# Patient Record
Sex: Male | Born: 1950 | Race: Black or African American | Hispanic: No | Smoking: Current every day smoker
Health system: Southern US, Community
[De-identification: ages and names within clinical notes are randomized; demographics above are authoritative.]

## PROBLEM LIST (undated history)

## (undated) DIAGNOSIS — F10231 Alcohol dependence with withdrawal delirium: Secondary | ICD-10-CM

## (undated) DIAGNOSIS — R4182 Altered mental status, unspecified: Secondary | ICD-10-CM

## (undated) DIAGNOSIS — K047 Periapical abscess without sinus: Secondary | ICD-10-CM

## (undated) DIAGNOSIS — M25462 Effusion, left knee: Secondary | ICD-10-CM

---

## 2006-10-22 ENCOUNTER — Emergency Department (HOSPITAL_COMMUNITY): Admission: EM | Admit: 2006-10-22 | Discharge: 2006-10-22 | Payer: Self-pay | Admitting: Emergency Medicine

## 2013-05-20 ENCOUNTER — Encounter (HOSPITAL_COMMUNITY): Payer: Self-pay | Admitting: Emergency Medicine

## 2013-05-20 ENCOUNTER — Emergency Department (HOSPITAL_COMMUNITY)
Admission: EM | Admit: 2013-05-20 | Discharge: 2013-05-20 | Disposition: A | Discharge status: Nursing Home (Includes ICF) | Payer: Medicaid Other | Source: Home / Self Care

## 2013-05-20 ENCOUNTER — Emergency Department (HOSPITAL_COMMUNITY): Payer: Medicaid Other

## 2013-05-20 ENCOUNTER — Emergency Department (HOSPITAL_COMMUNITY)
Admission: EM | Admit: 2013-05-20 | Discharge: 2013-05-20 | Disposition: A | Discharge status: Home / Self Care | Payer: Medicaid Other | Attending: Emergency Medicine | Admitting: Emergency Medicine

## 2013-05-20 DIAGNOSIS — R1013 Epigastric pain: Secondary | ICD-10-CM

## 2013-05-20 DIAGNOSIS — R0682 Tachypnea, not elsewhere classified: Secondary | ICD-10-CM

## 2013-05-20 DIAGNOSIS — F17200 Nicotine dependence, unspecified, uncomplicated: Secondary | ICD-10-CM | POA: Insufficient documentation

## 2013-05-20 DIAGNOSIS — R079 Chest pain, unspecified: Secondary | ICD-10-CM | POA: Insufficient documentation

## 2013-05-20 DIAGNOSIS — R0609 Other forms of dyspnea: Secondary | ICD-10-CM

## 2013-05-20 DIAGNOSIS — F1010 Alcohol abuse, uncomplicated: Secondary | ICD-10-CM

## 2013-05-20 DIAGNOSIS — R61 Generalized hyperhidrosis: Secondary | ICD-10-CM | POA: Insufficient documentation

## 2013-05-20 DIAGNOSIS — R9431 Abnormal electrocardiogram [ECG] [EKG]: Secondary | ICD-10-CM

## 2013-05-20 DIAGNOSIS — K2970 Gastritis, unspecified, without bleeding: Secondary | ICD-10-CM

## 2013-05-20 DIAGNOSIS — K5289 Other specified noninfective gastroenteritis and colitis: Secondary | ICD-10-CM | POA: Insufficient documentation

## 2013-05-20 DIAGNOSIS — R000 Tachycardia, unspecified: Secondary | ICD-10-CM | POA: Insufficient documentation

## 2013-05-20 DIAGNOSIS — R0600 Dyspnea, unspecified: Secondary | ICD-10-CM

## 2013-05-20 LAB — COMPREHENSIVE METABOLIC PANEL
ALT: 35 U/L (ref 0–53)
BUN: 34 mg/dL — ABNORMAL HIGH (ref 6–23)
CO2: 24 mEq/L (ref 19–32)
Calcium: 8 mg/dL — ABNORMAL LOW (ref 8.4–10.5)
Creatinine, Ser: 0.9 mg/dL (ref 0.50–1.35)
GFR calc Af Amer: >90 mL/min (ref >90)
GFR calc non Af Amer: >90 mL/min (ref >90)
Glucose, Bld: 81 mg/dL (ref 70–99)
Total Protein: 5.6 g/dL — ABNORMAL LOW (ref 6.0–8.3)

## 2013-05-20 LAB — CBC WITH DIFFERENTIAL/PLATELET
Eosinophils Absolute: 0 10*3/uL (ref 0.0–0.7)
Eosinophils Relative: 0 % (ref 0–5)
HCT: 38.8 % — ABNORMAL LOW (ref 39.0–52.0)
Lymphocytes Relative: 15 % (ref 12–46)
Lymphs Abs: 0.9 10*3/uL (ref 0.7–4.0)
MCH: 32.9 pg (ref 26.0–34.0)
MCV: 96 fL (ref 78.0–100.0)
Monocytes Absolute: 0.7 10*3/uL (ref 0.1–1.0)
Platelets: 154 10*3/uL (ref 150–400)
RBC: 4.04 MIL/uL — ABNORMAL LOW (ref 4.22–5.81)
WBC: 6 10*3/uL (ref 4.0–10.5)

## 2013-05-20 LAB — POCT I-STAT TROPONIN I: Troponin i, poc: 0 ng/mL (ref 0.00–0.08)

## 2013-05-20 LAB — LIPASE, BLOOD: Lipase: 26 U/L (ref 11–59)

## 2013-05-20 MED ORDER — SODIUM CHLORIDE 0.9 % IV SOLN
Freq: Once | INTRAVENOUS | Status: AC
  Administered 2013-05-20: 11:00:00 via INTRAVENOUS

## 2013-05-20 MED ORDER — MORPHINE SULFATE 4 MG/ML IJ SOLN
4.0000 mg | Freq: Once | INTRAMUSCULAR | Status: AC
  Administered 2013-05-20: 4 mg via INTRAVENOUS
  Filled 2013-05-20: qty 1

## 2013-05-20 MED ORDER — GI COCKTAIL ~~LOC~~
30.0000 mL | Freq: Once | ORAL | Status: AC
  Administered 2013-05-20: 30 mL via ORAL
  Filled 2013-05-20: qty 30

## 2013-05-20 MED ORDER — SODIUM CHLORIDE 0.9 % IV BOLUS (SEPSIS)
1000.0000 mL | Freq: Once | INTRAVENOUS | Status: AC
  Administered 2013-05-20: 1000 mL via INTRAVENOUS

## 2013-05-20 MED ORDER — ONDANSETRON HCL 4 MG/2ML IJ SOLN
4.0000 mg | Freq: Once | INTRAMUSCULAR | Status: AC
  Administered 2013-05-20: 4 mg via INTRAVENOUS
  Filled 2013-05-20: qty 2

## 2013-05-20 MED ORDER — ONDANSETRON HCL 4 MG PO TABS: 4.0000 mg | ORAL_TABLET | Freq: Four times a day (QID) | ORAL | Status: DC

## 2013-05-20 NOTE — ED Provider Notes (Signed)
Medical screening examination/treatment/procedure(s) were performed by resident physician or non-physician practitioner and as supervising physician I was immediately available for consultation/collaboration.   Barkley Bruns MD.   Linna Hoff, MD 05/20/13 [phone]

## 2013-05-20 NOTE — ED Provider Notes (Signed)
CSN: [id number]     Arrival date & time 05/20/13  1005 History   None    Chief Complaint  Patient presents with  . Chest Pain   (Consider location/radiation/quality/duration/timing/severity/associated sxs/prior Treatment) HPI Comments: 62 year old male presents with a one to two-week progression of pain across the lower chest and abdomen associated with new onset of dyspnea with episodes of diaphoresis, nausea and vomiting. He denies pain in the actual chest. Denies heaviness tightness fullness or pressure in the chest. He has a 40-pack-year history of smoking and consumes and admitted 6 pack of beer daily.   History reviewed. No pertinent past medical history. History reviewed. No pertinent past surgical history. History reviewed. No pertinent family history. History  Substance Use Topics  . Smoking status: Current Every Day Smoker -- 1.00 packs/day    Types: Cigarettes  . Smokeless tobacco: Not on file  . Alcohol Use: Yes    Review of Systems  Constitutional: Positive for diaphoresis, activity change and appetite change. Negative for fever.  HENT: Negative.   Respiratory: Positive for shortness of breath and wheezing. Negative for cough.   Cardiovascular: Positive for palpitations.  Gastrointestinal: Positive for nausea, vomiting and abdominal pain.  Genitourinary: Negative.   Neurological: Negative for facial asymmetry and speech difficulty.    Allergies  Review of patient's allergies indicates no known allergies.  Home Medications  No current outpatient prescriptions on file. BP 116/85  Pulse 123  Resp 20  SpO2 95% Physical Exam  Nursing note and vitals reviewed. Constitutional: He is oriented to person, place, and time. He appears well-developed.  Thin male, suspect poorly nourished. Speaks in complete sentences. Increase breathing effort.  HENT:  Mouth/Throat: Oropharynx is clear and moist. No oropharyngeal exudate.  Eyes: EOM are normal.  Neck: Normal range of  motion. Neck supple.  Cardiovascular:  Tachycardia regular rhythm.  Pulmonary/Chest:  Increased respiratory effort and rate. No adventitious sounds heard.  Abdominal: Soft. There is tenderness. There is no rebound.  Tenderness in the epigastrium.  Musculoskeletal: He exhibits no edema.  Lymphadenopathy:    He has no cervical adenopathy.  Neurological: He is alert and oriented to person, place, and time. He exhibits normal muscle tone.  Skin: Skin is warm and dry. No rash noted.    ED Course  Procedures (including critical care time) Labs Review Labs Reviewed - No data to display Imaging Review No results found.  EKG: Sinus tach at 124, Invert P waves aVL and V3 Notched P waves. Prolonged Q-T seg. LAD. No ectopy.  MDM   1. Dyspnea   2. Epigastric pain   3. Tachypnea   4. Tobacco use disorder   5. Alcohol abuse   6. Abnormal EKG     Transfer to the ED for evaluation of 1-2 week progressive pain across the epigastrium, dyspnea, N and V, abnormal EKG, Episodes of diaphoresis in a 34 Y O M with a 40 PY smoking history and 6 pk beer daily consumer.  Differential includes alcoholic gastritis w/wo bleeding, COPD, PE, angina/ischemia.      Hayden Rasmussen, NP 05/20/13 1102  Hayden Rasmussen, NP 05/20/13 1104

## 2013-05-20 NOTE — ED Notes (Signed)
[facility] has been called.  

## 2013-05-20 NOTE — ED Provider Notes (Signed)
Medical screening examination/treatment/procedure(s) were performed by non-physician practitioner and as supervising physician I was immediately available for consultation/collaboration.  EKG Interpretation   None          Gilda Crease, MD 05/20/13 [phone]

## 2013-05-20 NOTE — ED Notes (Signed)
Reports chest pain x 2 wks. States today is worse. Having sob. Vomiting and diarrhea.  Sweats.  Denies cardiac hx.  Pain in center of chest with no left arm pain.

## 2013-05-20 NOTE — ED Provider Notes (Signed)
CSN: [id number]     Arrival date & time 05/20/13  1138 History   First MD Initiated Contact with Patient 05/20/13 1159     Chief Complaint  Patient presents with  . Abdominal Pain   (Consider location/radiation/quality/duration/timing/severity/associated sxs/prior Treatment) HPI Comments: Patient presents to the emergency department with chief complaint of chest pain and epigastric pain. He states the pain began one to 2 weeks ago. States that it recently worsened last night. He states that he has had associated diaphoresis, nausea, vomiting, and diarrhea. He denies any hemoptysis, hematemesis, or hematochezia. He denies any other health problems. He was seen in urgent care earlier today, and was sent to the emergency department for further evaluation of the chest pain/epigastric pain. He has not tried anything to alleviate his symptoms. Nothing makes his symptoms better or worse.  The history is provided by the patient. No language interpreter was used.    History reviewed. No pertinent past medical history. History reviewed. No pertinent past surgical history. History reviewed. No pertinent family history. History  Substance Use Topics  . Smoking status: Current Every Day Smoker -- 1.00 packs/day    Types: Cigarettes  . Smokeless tobacco: Not on file  . Alcohol Use: Yes     Comment: 6 pack a day    Review of Systems  All other systems reviewed and are negative.    Allergies  Review of patient's allergies indicates no known allergies.  Home Medications  No current outpatient prescriptions on file. BP 121/89  Pulse 90  Temp(Src) 98.4 F (36.9 C) (Oral)  Resp 24  SpO2 96% Physical Exam  Nursing note and vitals reviewed. Constitutional: He is oriented to person, place, and time. He appears well-developed and well-nourished.  HENT:  Head: Normocephalic and atraumatic.  Right Ear: External ear normal.  Left Ear: External ear normal.  Nose: Nose normal.  Mouth/Throat:  Oropharynx is clear and moist. No oropharyngeal exudate.  Eyes: Conjunctivae and EOM are normal. Pupils are equal, round, and reactive to light. Right eye exhibits no discharge. Left eye exhibits no discharge. No scleral icterus.  Neck: Normal range of motion. Neck supple. No JVD present.  Cardiovascular: Regular rhythm, normal heart sounds and intact distal pulses.  Exam reveals no gallop and no friction rub.   No murmur heard. Mildly tachycardic  Pulmonary/Chest: Effort normal and breath sounds normal. No respiratory distress. He has no wheezes. He has no rales. He exhibits no tenderness.  Abdominal: Soft. He exhibits no distension and no mass. There is tenderness. There is no rebound and no guarding.  Epigastric abdominal tenderness  Musculoskeletal: Normal range of motion. He exhibits no edema and no tenderness.  Neurological: He is alert and oriented to person, place, and time. He has normal reflexes.  CN 3-12 intact  Skin: Skin is warm and dry.  Psychiatric: He has a normal mood and affect. His behavior is normal. Judgment and thought content normal.    ED Course  Procedures (including critical care time) Results for orders placed during the hospital encounter of 05/20/13  CBC WITH DIFFERENTIAL      Result Value Range   WBC 6.0  4.0 - 10.5 K/uL   RBC 4.04 (*) 4.22 - 5.81 MIL/uL   Hemoglobin 13.3  13.0 - 17.0 g/dL   HCT 40.9 (*) 81.1 - 91.4 %   MCV 96.0  78.0 - 100.0 fL   MCH 32.9  26.0 - 34.0 pg   MCHC 34.3  30.0 - 36.0 g/dL  RDW 13.5  11.5 - 15.5 %   Platelets 154  150 - 400 K/uL   Neutrophils Relative % 73  43 - 77 %   Neutro Abs 4.4  1.7 - 7.7 K/uL   Lymphocytes Relative 15  12 - 46 %   Lymphs Abs 0.9  0.7 - 4.0 K/uL   Monocytes Relative 11  3 - 12 %   Monocytes Absolute 0.7  0.1 - 1.0 K/uL   Eosinophils Relative 0  0 - 5 %   Eosinophils Absolute 0.0  0.0 - 0.7 K/uL   Basophils Relative 0  0 - 1 %   Basophils Absolute 0.0  0.0 - 0.1 K/uL  COMPREHENSIVE METABOLIC  PANEL      Result Value Range   Sodium 138  135 - 145 mEq/L   Potassium 4.2  3.5 - 5.1 mEq/L   Chloride 104  96 - 112 mEq/L   CO2 24  19 - 32 mEq/L   Glucose, Bld 81  70 - 99 mg/dL   BUN 34 (*) 6 - 23 mg/dL   Creatinine, Ser 1.61  0.50 - 1.35 mg/dL   Calcium 8.0 (*) 8.4 - 10.5 mg/dL   Total Protein 5.6 (*) 6.0 - 8.3 g/dL   Albumin 2.9 (*) 3.5 - 5.2 g/dL   AST 28  0 - 37 U/L   ALT 35  0 - 53 U/L   Alkaline Phosphatase 50  39 - 117 U/L   Total Bilirubin 1.4 (*) 0.3 - 1.2 mg/dL   GFR calc non Af Amer >90  >90 mL/min   GFR calc Af Amer >90  >90 mL/min  LIPASE, BLOOD      Result Value Range   Lipase 26  11 - 59 U/L  POCT I-STAT TROPONIN I      Result Value Range   Troponin i, poc 0.00  0.00 - 0.08 ng/mL   Comment 3            Dg Chest 2 View  05/20/2013   CLINICAL DATA:  Chest pain, shortness of breath.  EXAM: CHEST  2 VIEW  COMPARISON:  None.  FINDINGS: The heart size and mediastinal contours are within normal limits. Hyperexpansion of the lungs are noted. No pleural effusion or pneumothorax is noted. Both lungs are clear. The visualized skeletal structures are unremarkable.  IMPRESSION: No acute cardiopulmonary abnormality seen.   Electronically Signed   By: Roque Lias M.D.   On: 05/20/2013 13:50      EKG Interpretation    Date/Time:  Tuesday May 20 2013 11:43:54 EST Ventricular Rate:  92 PR Interval:  144 QRS Duration: 73 QT Interval:  391 QTC Calculation: 484 R Axis:   38 Text Interpretation:  Age not entered, assumed to be  62 years old for purpose of ECG interpretation Sinus rhythm Probable left atrial enlargement Borderline prolonged QT interval No significant change since last tracing Confirmed by POLLINA  MD, CHRISTOPHER ([id number]) on 05/20/2013 1:22:19 PM            MDM   1. Gastritis     Patient with abdominal pain/chest pain. Patient was seen by urgent care earlier today, and sent to the emergency department for further evaluation. Will check basic  labs, EKG, chest x-ray, and give pain and nausea medicine. Patient's cardiac risk factors include age and smoking history. He denies any other health problems.  2:58 PM Patient's labs are reassuring.  Suspect that in the setting of drinking 6 beers per  day, and having nausea, vomiting, and diarrhea, that the patient has gastritis vs gastroenteritis.  Doubt ACS.  Troponin is negative, EKG is unchanged, Heart score is 2. No actual chest pain, the patient's pain is located in the epigastrium.  No SOB.  Vitals are stable.  Patient states that he feels better.  Discharge to home with strict return precautions.  Patient understands and agrees with the plan. Patient, treatment and discharge plan discussed with Dr. Blinda Leatherwood, who agrees with the plan.  Filed Vitals:   05/20/13 1431  BP: 104/84  Pulse: 86  Temp:   Resp: [address], PA-C 05/20/13 1541

## 2013-05-20 NOTE — ED Notes (Signed)
Pt reports to the ED for eval of epigastric pain x 2 weeks. Pt reports that he has also had some nausea and vomiting x 2 weeks as well. Pt denies any hematemesis. Pt reports the symptoms became worse last pm and he also started having some diahrrea which prompted him to go to the [facility]. Pt also reports intermittent fevers and chills. Pt also reports some midsternal chest tightness and pressure. Pt also reports some SOB and episodes of diaphoresis. Pt sent here for possible 12 lead abnormalities. Per [facility] pt had some P-wave inversions. Pt also slightly tachycardic in the 90s-100s. Pt A&O x4, resp e/u, and skin warm and dry. VSS en route. Pt given approx 600 cc of normal saline PTA.

## 2013-09-04 ENCOUNTER — Encounter (HOSPITAL_COMMUNITY): Payer: Self-pay | Admitting: Emergency Medicine

## 2013-09-04 ENCOUNTER — Emergency Department (HOSPITAL_COMMUNITY): Payer: Medicaid Other

## 2013-09-04 ENCOUNTER — Inpatient Hospital Stay (HOSPITAL_COMMUNITY)
Admission: EM | Admit: 2013-09-04 | Discharge: 2013-09-10 | DRG: 896 | Disposition: A | Discharge status: Home / Self Care | Payer: Medicaid Other | Attending: Internal Medicine | Admitting: Internal Medicine

## 2013-09-04 ENCOUNTER — Emergency Department (HOSPITAL_COMMUNITY)
Admission: EM | Admit: 2013-09-04 | Discharge: 2013-09-04 | Disposition: A | Discharge status: Home / Self Care | Payer: Medicaid Other | Source: Home / Self Care | Attending: Emergency Medicine | Admitting: Emergency Medicine

## 2013-09-04 DIAGNOSIS — F17200 Nicotine dependence, unspecified, uncomplicated: Secondary | ICD-10-CM | POA: Insufficient documentation

## 2013-09-04 DIAGNOSIS — R079 Chest pain, unspecified: Secondary | ICD-10-CM

## 2013-09-04 DIAGNOSIS — R0602 Shortness of breath: Secondary | ICD-10-CM | POA: Insufficient documentation

## 2013-09-04 DIAGNOSIS — M25462 Effusion, left knee: Secondary | ICD-10-CM | POA: Diagnosis present

## 2013-09-04 DIAGNOSIS — K7010 Alcoholic hepatitis without ascites: Secondary | ICD-10-CM

## 2013-09-04 DIAGNOSIS — W19XXXA Unspecified fall, initial encounter: Secondary | ICD-10-CM | POA: Diagnosis present

## 2013-09-04 DIAGNOSIS — Z781 Physical restraint status: Secondary | ICD-10-CM | POA: Diagnosis present

## 2013-09-04 DIAGNOSIS — Y92009 Unspecified place in unspecified non-institutional (private) residence as the place of occurrence of the external cause: Secondary | ICD-10-CM

## 2013-09-04 DIAGNOSIS — Z681 Body mass index (BMI) 19 or less, adult: Secondary | ICD-10-CM

## 2013-09-04 DIAGNOSIS — K047 Periapical abscess without sinus: Secondary | ICD-10-CM

## 2013-09-04 DIAGNOSIS — Z9181 History of falling: Secondary | ICD-10-CM

## 2013-09-04 DIAGNOSIS — E43 Unspecified severe protein-calorie malnutrition: Secondary | ICD-10-CM

## 2013-09-04 DIAGNOSIS — E440 Moderate protein-calorie malnutrition: Secondary | ICD-10-CM | POA: Diagnosis present

## 2013-09-04 DIAGNOSIS — E871 Hypo-osmolality and hyponatremia: Secondary | ICD-10-CM | POA: Diagnosis present

## 2013-09-04 DIAGNOSIS — F411 Generalized anxiety disorder: Secondary | ICD-10-CM | POA: Insufficient documentation

## 2013-09-04 DIAGNOSIS — R4182 Altered mental status, unspecified: Secondary | ICD-10-CM

## 2013-09-04 DIAGNOSIS — R259 Unspecified abnormal involuntary movements: Secondary | ICD-10-CM | POA: Diagnosis present

## 2013-09-04 DIAGNOSIS — R1084 Generalized abdominal pain: Secondary | ICD-10-CM

## 2013-09-04 DIAGNOSIS — M2340 Loose body in knee, unspecified knee: Secondary | ICD-10-CM | POA: Diagnosis present

## 2013-09-04 DIAGNOSIS — F1010 Alcohol abuse, uncomplicated: Secondary | ICD-10-CM

## 2013-09-04 DIAGNOSIS — M1710 Unilateral primary osteoarthritis, unspecified knee: Secondary | ICD-10-CM | POA: Diagnosis present

## 2013-09-04 DIAGNOSIS — R109 Unspecified abdominal pain: Secondary | ICD-10-CM | POA: Diagnosis present

## 2013-09-04 DIAGNOSIS — F10231 Alcohol dependence with withdrawal delirium: Secondary | ICD-10-CM | POA: Diagnosis present

## 2013-09-04 DIAGNOSIS — F1020 Alcohol dependence, uncomplicated: Secondary | ICD-10-CM | POA: Diagnosis present

## 2013-09-04 DIAGNOSIS — M25469 Effusion, unspecified knee: Secondary | ICD-10-CM | POA: Diagnosis present

## 2013-09-04 DIAGNOSIS — F10931 Alcohol use, unspecified with withdrawal delirium: Principal | ICD-10-CM

## 2013-09-04 DIAGNOSIS — D696 Thrombocytopenia, unspecified: Secondary | ICD-10-CM | POA: Diagnosis present

## 2013-09-04 DIAGNOSIS — Z7982 Long term (current) use of aspirin: Secondary | ICD-10-CM

## 2013-09-04 DIAGNOSIS — M25569 Pain in unspecified knee: Secondary | ICD-10-CM | POA: Diagnosis not present

## 2013-09-04 LAB — HEPATIC FUNCTION PANEL
ALT: 51 U/L (ref 0–53)
AST: 93 U/L — ABNORMAL HIGH (ref 0–37)
Albumin: 3.5 g/dL (ref 3.5–5.2)
Alkaline Phosphatase: 102 U/L (ref 39–117)
BILIRUBIN INDIRECT: 1.6 mg/dL — AB (ref 0.3–0.9)
Bilirubin, Direct: 0.7 mg/dL — ABNORMAL HIGH (ref 0.0–0.3)
TOTAL PROTEIN: 7.5 g/dL (ref 6.0–8.3)
Total Bilirubin: 2.3 mg/dL — ABNORMAL HIGH (ref 0.3–1.2)

## 2013-09-04 LAB — BASIC METABOLIC PANEL
BUN: 10 mg/dL (ref 6–23)
BUN: 11 mg/dL (ref 6–23)
CALCIUM: 9 mg/dL (ref 8.4–10.5)
CALCIUM: 9 mg/dL (ref 8.4–10.5)
CHLORIDE: 94 meq/L — AB (ref 96–112)
CO2: 22 meq/L (ref 19–32)
CO2: 20 meq/L (ref 19–32)
CREATININE: 0.89 mg/dL (ref 0.50–1.35)
Chloride: 95 mEq/L — ABNORMAL LOW (ref 96–112)
Creatinine, Ser: 0.9 mg/dL (ref 0.50–1.35)
GFR calc Af Amer: >90 mL/min (ref >90)
GFR calc non Af Amer: >90 mL/min (ref >90)
GFR calc non Af Amer: >90 mL/min (ref >90)
GLUCOSE: 81 mg/dL (ref 70–99)
Glucose, Bld: 62 mg/dL — ABNORMAL LOW (ref 70–99)
Potassium: 4.2 mEq/L (ref 3.7–5.3)
Potassium: 4.4 mEq/L (ref 3.7–5.3)
SODIUM: 136 meq/L — AB (ref 137–147)
Sodium: 134 mEq/L — ABNORMAL LOW (ref 137–147)

## 2013-09-04 LAB — CBC WITH DIFFERENTIAL/PLATELET
BASOS ABS: 0 10*3/uL (ref 0.0–0.1)
BASOS PCT: 0 % (ref 0–1)
Eosinophils Absolute: 0 10*3/uL (ref 0.0–0.7)
Eosinophils Relative: 0 % (ref 0–5)
HCT: 39.8 % (ref 39.0–52.0)
Hemoglobin: 14.1 g/dL (ref 13.0–17.0)
LYMPHS PCT: 7 % — AB (ref 12–46)
Lymphs Abs: 0.5 10*3/uL — ABNORMAL LOW (ref 0.7–4.0)
MCH: 31.9 pg (ref 26.0–34.0)
MCHC: 35.4 g/dL (ref 30.0–36.0)
MCV: 90 fL (ref 78.0–100.0)
Monocytes Absolute: 0.8 10*3/uL (ref 0.1–1.0)
Monocytes Relative: 11 % (ref 3–12)
NEUTROS ABS: 5.5 10*3/uL (ref 1.7–7.7)
Neutrophils Relative %: 82 % — ABNORMAL HIGH (ref 43–77)
Platelets: 120 10*3/uL — ABNORMAL LOW (ref 150–400)
RBC: 4.42 MIL/uL (ref 4.22–5.81)
RDW: 14.9 % (ref 11.5–15.5)
WBC: 6.8 10*3/uL (ref 4.0–10.5)

## 2013-09-04 LAB — CBC
HCT: 42.6 % (ref 39.0–52.0)
HEMOGLOBIN: 15 g/dL (ref 13.0–17.0)
MCH: 31.8 pg (ref 26.0–34.0)
MCHC: 35.2 g/dL (ref 30.0–36.0)
MCV: 90.3 fL (ref 78.0–100.0)
Platelets: 139 10*3/uL — ABNORMAL LOW (ref 150–400)
RBC: 4.72 MIL/uL (ref 4.22–5.81)
RDW: 15 % (ref 11.5–15.5)
WBC: 6.5 10*3/uL (ref 4.0–10.5)

## 2013-09-04 LAB — I-STAT TROPONIN, ED
TROPONIN I, POC: 0 ng/mL (ref 0.00–0.08)
Troponin i, poc: 0 ng/mL (ref 0.00–0.08)

## 2013-09-04 LAB — ETHANOL: Alcohol, Ethyl (B): <11 mg/dL (ref 0–11)

## 2013-09-04 LAB — CBG MONITORING, ED: GLUCOSE-CAPILLARY: 73 mg/dL (ref 70–99)

## 2013-09-04 LAB — I-STAT CG4 LACTIC ACID, ED: LACTIC ACID, VENOUS: 1.5 mmol/L (ref 0.5–2.2)

## 2013-09-04 LAB — PRO B NATRIURETIC PEPTIDE: Pro B Natriuretic peptide (BNP): 439 pg/mL — ABNORMAL HIGH (ref 0–125)

## 2013-09-04 LAB — LIPASE, BLOOD: LIPASE: 18 U/L (ref 11–59)

## 2013-09-04 MED ORDER — VITAMIN B-1 100 MG PO TABS: 100.0000 mg | ORAL_TABLET | Freq: Every day | ORAL | Status: DC

## 2013-09-04 MED ORDER — IOHEXOL 300 MG/ML  SOLN
100.0000 mL | Freq: Once | INTRAMUSCULAR | Status: AC | PRN
  Administered 2013-09-04: 100 mL via INTRAVENOUS

## 2013-09-04 MED ORDER — LORAZEPAM 2 MG/ML IJ SOLN
0.0000 mg | Freq: Two times a day (BID) | INTRAMUSCULAR | Status: DC
  Filled 2013-09-04: qty 1

## 2013-09-04 MED ORDER — LORAZEPAM 1 MG PO TABS: 0.0000 mg | ORAL_TABLET | Freq: Two times a day (BID) | ORAL | Status: DC

## 2013-09-04 MED ORDER — LORAZEPAM 2 MG/ML IJ SOLN
1.0000 mg | Freq: Once | INTRAMUSCULAR | Status: AC
  Administered 2013-09-04: 1 mg via INTRAVENOUS
  Filled 2013-09-04: qty 1

## 2013-09-04 MED ORDER — NITROGLYCERIN 0.4 MG SL SUBL: 0.4000 mg | SUBLINGUAL_TABLET | SUBLINGUAL | Status: DC | PRN

## 2013-09-04 MED ORDER — ASPIRIN 325 MG PO TABS
325.0000 mg | ORAL_TABLET | Freq: Once | ORAL | Status: AC
  Administered 2013-09-04: 325 mg via ORAL
  Filled 2013-09-04: qty 1

## 2013-09-04 MED ORDER — IOHEXOL 300 MG/ML  SOLN
25.0000 mL | Freq: Once | INTRAMUSCULAR | Status: AC | PRN
  Administered 2013-09-04: 25 mL via ORAL

## 2013-09-04 MED ORDER — THIAMINE HCL 100 MG/ML IJ SOLN: 100.0000 mg | Freq: Every day | INTRAMUSCULAR | Status: DC

## 2013-09-04 MED ORDER — LORAZEPAM 2 MG/ML IJ SOLN
0.0000 mg | Freq: Four times a day (QID) | INTRAMUSCULAR | Status: DC
  Administered 2013-09-04: 2 mg via INTRAVENOUS
  Filled 2013-09-04: qty 1

## 2013-09-04 MED ORDER — LORAZEPAM 1 MG PO TABS
0.0000 mg | ORAL_TABLET | Freq: Four times a day (QID) | ORAL | Status: DC
Start: 2013-09-05 — End: 2013-09-05

## 2013-09-04 MED ORDER — SODIUM CHLORIDE 0.9 % IV SOLN
INTRAVENOUS | Status: DC
  Administered 2013-09-04: 22:00:00 via INTRAVENOUS

## 2013-09-04 NOTE — ED Notes (Signed)
Patient transported to X-ray 

## 2013-09-04 NOTE — ED Notes (Signed)
Pt arrives via EMS from  Home after pts son calling EMS. Reports that pt was released today from the ED, seen for CP. Pts son states that the last time he was last seen normal was 9am on 09/03/13. Pts son stated to EMS that pt has fallen 4 times and has not been acting normal. Pt is alert and oriented to self and birthday only which is not his baseline according to son. EMS reported an abrasion to his chin is the only injury. States that pt will mention pain to the LUQ and knees but when asked about pain will deny. No orthostatic changes. 12 lead unremarkable. EMS reports that upon arrival to the ED pt was unable to form words which was the first time that happened during their trip. BP 138/98 HR 104 RR 20 CBG 83 92% RA 95% 2L

## 2013-09-04 NOTE — ED Notes (Signed)
Pt brought to room via wheelchair; pt now getting undressed and into a gown at this time; Amy, NT assisting pt

## 2013-09-04 NOTE — ED Notes (Addendum)
Pt reports onset yesterday of mid chest pains and sob. Reports heavy etoh but none this am, unsure of last time he drank, tremors noted at triage. spo2 95% at triage, ekg done.

## 2013-09-04 NOTE — ED Notes (Signed)
CG-4 result shown to Dr. Blinda LeatherwoodPollina

## 2013-09-04 NOTE — ED Provider Notes (Signed)
CSN: [id number]     Arrival date & time 09/04/13  0831 History   First MD Initiated Contact with Patient 09/04/13 0840     Chief Complaint  Patient presents with  . Chest Pain  . Shortness of Breath     (Consider location/radiation/quality/duration/timing/severity/associated sxs/prior Treatment) Patient is a 63 y.o. male presenting with chest pain and shortness of breath.  Chest Pain Pain location:  L lateral chest and L chest Pain quality: pressure   Radiates to: R leg. Pain radiates to the back: no   Pain severity:  Moderate Onset quality:  Sudden Timing:  Constant Progression:  Unchanged Chronicity:  New Context: at rest   Associated symptoms: shortness of breath   Associated symptoms: no cough and no fever   Shortness of Breath Associated symptoms: chest pain   Associated symptoms: no cough and no fever     History reviewed. No pertinent past medical history. History reviewed. No pertinent past surgical history. History reviewed. No pertinent family history. History  Substance Use Topics  . Smoking status: Current Every Day Smoker -- 1.00 packs/day    Types: Cigarettes  . Smokeless tobacco: Not on file  . Alcohol Use: Yes     Comment: 6 pack a day/heavy    Review of Systems  Constitutional: Negative for fever.  Respiratory: Positive for shortness of breath. Negative for cough.   Cardiovascular: Positive for chest pain. Negative for leg swelling.  All other systems reviewed and are negative.      Allergies  Review of patient's allergies indicates no known allergies.  Home Medications   Current Outpatient Rx  Name  Route  Sig  Dispense  Refill  . aspirin EC 81 MG tablet   Oral   Take 81 mg by mouth daily.          BP 149/55  Pulse 91  Temp(Src) 97.8 F (36.6 C) (Oral)  Resp 18  SpO2 95% Physical Exam  Nursing note and vitals reviewed. Constitutional: He is oriented to person, place, and time. He appears well-developed and well-nourished. No  distress.  HENT:  Head: Normocephalic and atraumatic.  Mouth/Throat: No oropharyngeal exudate.  Eyes: EOM are normal. Pupils are equal, round, and reactive to light.  Neck: Normal range of motion. Neck supple.  Cardiovascular: Normal rate and regular rhythm.  Exam reveals no friction rub.   No murmur heard. Pulmonary/Chest: Effort normal and breath sounds normal. No respiratory distress. He has no wheezes. He has no rales.  Abdominal: He exhibits no distension. There is tenderness (mild, diffuse). There is no rebound.  Musculoskeletal: Normal range of motion. He exhibits no edema.  Neurological: He is alert and oriented to person, place, and time. No cranial nerve deficit. He exhibits normal muscle tone. Coordination normal.  Skin: Skin is warm. No rash noted. He is not diaphoretic.    ED Course  Procedures (including critical care time) Labs Review Labs Reviewed  CBC  BASIC METABOLIC PANEL  PRO B NATRIURETIC PEPTIDE  LIPASE, BLOOD  HEPATIC FUNCTION PANEL  I-STAT TROPOININ, ED   Imaging Review Dg Chest 2 View  09/04/2013   CLINICAL DATA:  Chest pain.  Shortness of breath.  EXAM: CHEST  2 VIEW  COMPARISON:  05/20/2013  FINDINGS: Heart size is normal. Mediastinal shadows are normal. The lungs are hyperinflated suggesting emphysema. No evidence of infiltrate, mass, effusion or collapse. Bony density related to the distal first ribs again noted.  IMPRESSION: No active cardiopulmonary disease.  Probable emphysema.   Electronically  Signed   By: Paulina Fusi M.D.   On: 09/04/2013 09:18   Ct Abdomen Pelvis W Contrast  09/04/2013   CLINICAL DATA:  Chest and epigastric pain.  Acute worsening.  EXAM: CT ABDOMEN AND PELVIS WITH CONTRAST  TECHNIQUE: Multidetector CT imaging of the abdomen and pelvis was performed using the standard protocol following bolus administration of intravenous contrast.  CONTRAST:  OMNIPAQUE IOHEXOL 300 MG/ML  SOLN  COMPARISON:  Ultrasound studies 2008  FINDINGS: Lung  bases show mild scarring.  No consolidation.  No effusion.  The liver does not show any focal lesions. No calcified gallstones. The spleen is normal. The pancreas is normal. The adrenal glands are normal. The kidneys are normal. No cysts, mass, stone or hydronephrosis. The aorta and IVC are unremarkable. There is mild atherosclerosis of the aorta. Bladder, prostate gland and seminal vesicles appear unremarkable. There is an inguinal hernia on the right containing small intestine. No sign of strangulation or obstruction. The appendix is normal. No acute bowel pathology is visible. Ordinary mild degenerative changes affect the spine.  IMPRESSION: No acute finding. No cause of the pain is identified. The patient does have an inguinal hernia on the right containing small intestine but there is no evidence of obstruction or strangulation based on these images.   Electronically Signed   By: Paulina Fusi M.D.   On: 09/04/2013 12:56     EKG Interpretation   Date/Time:  Thursday September 04 2013 08:37:00 EDT Ventricular Rate:  90 PR Interval:  148 QRS Duration: 70 QT Interval:  420 QTC Calculation: 513 R Axis:   -32 Text Interpretation:  Normal sinus rhythm Possible Left atrial enlargement  Left axis deviation Nonspecific ST abnormality Prolonged QT Abnormal ECG  No significant change Confirmed by Gwendolyn Grant  MD, Lanore Renderos ([id number]) on 09/04/2013  8:41:12 AM      MDM   Final diagnoses:  Chest pain    29M with hx of of smoking, alcohol use presents with chest pain. Mid chest, states feels like something is on him. Associated SOB. No cough or fever. States feels his chest pain is in his R leg. Patient is difficult to understand, tremulous. States last drink last night. Here AFVSS, appears anxious. Will give Ativan to cover for alcohol withdrawal. Lungs clear, abdomen tender. Good pulses in all extremities. EKG similar to prior. Will start with labs, CXR. Could possibly be pancreatitis with hx of smoking/alcohol  use. Will CT abdomen belly pain on re-exam. Roommate here, reports patient hasn't had drink in over 12 hours, tremulousness is not a constant thing. Patient looking more relaxed after Ativan.  CT of abdomen normal. Serial troponins normal, patient states he's feeling much better. Stable for discharge.  Dagmar Hait, MD 09/04/13 [phone]

## 2013-09-04 NOTE — ED Notes (Signed)
Pt, remains in ct.

## 2013-09-04 NOTE — ED Notes (Signed)
Patient transported to CT 

## 2013-09-04 NOTE — Discharge Instructions (Signed)
Chest Pain (Nonspecific) °It is often hard to give a specific diagnosis for the cause of chest pain. There is always a chance that your pain could be related to something serious, such as a heart attack or a blood clot in the lungs. You need to follow up with your caregiver for further evaluation. °CAUSES  °· Heartburn. °· Pneumonia or bronchitis. °· Anxiety or stress. °· Inflammation around your heart (pericarditis) or lung (pleuritis or pleurisy). °· A blood clot in the lung. °· A collapsed lung (pneumothorax). It can develop suddenly on its own (spontaneous pneumothorax) or from injury (trauma) to the chest. °· Shingles infection (herpes zoster virus). °The chest wall is composed of bones, muscles, and cartilage. Any of these can be the source of the pain. °· The bones can be bruised by injury. °· The muscles or cartilage can be strained by coughing or overwork. °· The cartilage can be affected by inflammation and become sore (costochondritis). °DIAGNOSIS  °Lab tests or other studies, such as X-rays, electrocardiography, stress testing, or cardiac imaging, may be needed to find the cause of your pain.  °TREATMENT  °· Treatment depends on what may be causing your chest pain. Treatment may include: °· Acid blockers for heartburn. °· Anti-inflammatory medicine. °· Pain medicine for inflammatory conditions. °· Antibiotics if an infection is present. °· You may be advised to change lifestyle habits. This includes stopping smoking and avoiding alcohol, caffeine, and chocolate. °· You may be advised to keep your head raised (elevated) when sleeping. This reduces the chance of acid going backward from your stomach into your esophagus. °· Most of the time, nonspecific chest pain will improve within 2 to 3 days with rest and mild pain medicine. °HOME CARE INSTRUCTIONS  °· If antibiotics were prescribed, take your antibiotics as directed. Finish them even if you start to feel better. °· For the next few days, avoid physical  activities that bring on chest pain. Continue physical activities as directed. °· Do not smoke. °· Avoid drinking alcohol. °· Only take over-the-counter or prescription medicine for pain, discomfort, or fever as directed by your caregiver. °· Follow your caregiver's suggestions for further testing if your chest pain does not go away. °· Keep any follow-up appointments you made. If you do not go to an appointment, you could develop lasting (chronic) problems with pain. If there is any problem keeping an appointment, you must call to reschedule. °SEEK MEDICAL CARE IF:  °· You think you are having problems from the medicine you are taking. Read your medicine instructions carefully. °· Your chest pain does not go away, even after treatment. °· You develop a rash with blisters on your chest. °SEEK IMMEDIATE MEDICAL CARE IF:  °· You have increased chest pain or pain that spreads to your arm, neck, jaw, back, or abdomen. °· You develop shortness of breath, an increasing cough, or you are coughing up blood. °· You have severe back or abdominal pain, feel nauseous, or vomit. °· You develop severe weakness, fainting, or chills. °· You have a fever. °THIS IS AN EMERGENCY. Do not wait to see if the pain will go away. Get medical help at once. Call your local emergency services (911 in [place] Do not drive yourself to the hospital. °MAKE SURE YOU:  °· Understand these instructions. °· Will watch your condition. °· Will get help right away if you are not doing well or get worse. °Document Released: 03/01/2005 Document Revised: 08/14/2011 Document Reviewed: 12/26/2007 °ExitCare® Patient Information ©2014 [employer],   LLC. ° ° ° °Emergency Department Resource Guide °1) Find a Doctor and Pay Out of Pocket °Although you won't have to find out who is covered by your insurance plan, it is a good idea to ask around and get recommendations. You will then need to call the office and see if the doctor you have chosen will accept you as a new  patient and what types of options they offer for patients who are self-pay. Some doctors offer discounts or will set up payment plans for their patients who do not have insurance, but you will need to ask so you aren't surprised when you get to your appointment. ° °2) Contact Your Local Health Department °Not all health departments have doctors that can see patients for sick visits, but many do, so it is worth a call to see if yours does. If you don't know where your local health department is, you can check in your phone book. The CDC also has a tool to help you locate your state's health department, and many state websites also have listings of all of their local health departments. ° °3) Find a Walk-in Clinic °If your illness is not likely to be very severe or complicated, you may want to try a walk in clinic. These are popping up all over the country in pharmacies, drugstores, and shopping centers. They're usually staffed by nurse practitioners or physician assistants that have been trained to treat common illnesses and complaints. They're usually fairly quick and inexpensive. However, if you have serious medical issues or chronic medical problems, these are probably not your best option. ° °No Primary Care Doctor: °- Call [facility] at  832-8000 - they can help you locate a primary care doctor that  accepts your insurance, provides certain services, etc. °- Physician Referral Service- 1-800-533-3463 ° °Chronic Pain Problems: °Organization         Address  Phone   Notes  °[facility]  (336) 297-2271 Patients need to be referred by their primary care doctor.  ° °Medication Assistance: °Organization         Address  Phone   Notes  °[place] [facility] [address] °[place], [place] [address] (336) 641-8030 --Must be a resident of [place] °-- Must have NO insurance coverage whatsoever (no Medicaid/ Medicare, etc.) °-- The pt. MUST have a primary  care doctor that directs their care regularly and follows them in the community °  °[facility]  (866) 331-1348   °[employer]  (888) 892-1162   ° °Agencies that provide inexpensive medical care: °Organization         Address  Phone   Notes  °[facility]  (336) 832-8035   °[facility]    (336) 832-7272   °[facility] [address] °[place], [place] [address] (336) 832-4777   °[facility] [place] [address], °[place] (336) 271-4999   °[facility]    (336) 373-0678   °[facility]    (336) 272-1050   °[facility] ° [address], [place] Phone:  (336) 832-4444, Fax:  (336) 832-4440 Hours of Operation:  9 am - 6 pm, M-F.  Also accepts Medicaid/Medicare and self-pay.  °[facility] ° [address], [place] Phone: (336) 832-3150, Fax: (336) 832-3151. Hours of Operation:  8:30 am - 5:30 pm, M-F.  Also accepts Medicaid and self-pay.  °  [facility] [place] [address], [place] Phone: (336) 878-6027   °[facility] [address], [place], [place] (336)723-1848, Ext. 123 Mondays & Thursdays: 7-9 AM.  First 15 patients are seen on a first come, first serve basis. °  ° °Medicaid-accepting [place] Providers: ° °Organization         Address  Phone   Notes  °[facility] [address], [place] (336) 641-2100 Also accepts self-pay patients.  °[facility] [address], [place] ° (336) 856-9996   °[facility] [address], [place] (336) 288-8857   °[facility] [address] [place] [address], [place] (336) 299-7000   °Veita Bland [address], [place]  ° (336) 373-1557 Only accepts [place] Access Medicaid patients after they have their name applied to their card.  ° °Self-Pay (no insurance) in [place]: ° °Organization         Address  Phone   Notes  °Sickle Cell Patients, [facility] [address], [place] (336) 832-1970   °[facility] [address], [place] (336) 832-4400   °[facility] ° [address], [place] (336) 992-4800   °[facility]/Dr. Osei-Bonsu ° [address] [place] [address], [place] or [address], [place] (336) 841-8500 Phone number for both [place] and [place] locations is the same.  °[facility] [address], [place] (336) 299-0000   °[facility] [address] [place] [address], [place] or [address] (336) 852-7530 °(336) 878-2260   °[facility] [address], [place] (336) 350-1642, phone; (336) 294-5005, fax Sees patients 1st and 3rd Saturday of every month.  Must not qualify for public or private insurance (i.e. Medicaid, Medicare, [place] Health Choice, Veterans' Benefits) • Household income should be no more than 200% of the poverty level •The clinic cannot treat you if you are pregnant or think you are pregnant • Sexually transmitted diseases are not treated at the clinic.  ° ° °Dental Care: °Organization         Address  Phone  Notes  °[facility] [facility] [address], [place] (336) 641-6152 Accepts children up to age 21 who are enrolled in Medicaid or [place] Health Choice; pregnant women with a Medicaid card; and children who have applied for Medicaid or [place] Health Choice, but were declined, whose parents can pay a reduced fee at time of service.  °[facility] [place]  [address], [place] (336) 641-7733 Accepts children up to age 21 who are enrolled in Medicaid or [place] Health Choice; pregnant women with a Medicaid card; and children who have applied for Medicaid or [place] Health Choice, but were declined, whose parents can  pay a reduced fee at time of service.  °[facility] ° [address], [place] (336) 641-4533 Patients are seen by appointment only. Walk-ins are not accepted. [facility] will see patients 18 years of age and older. °Monday - Tuesday (8am-5pm) °Most Wednesdays (8:30-5pm) °$30 per visit, cash only  °[facility] ° [address], [place] (336) 641-4533 Patients are seen by appointment only. Walk-ins are not accepted. [facility] will see patients 18 years of   age and older. °One Wednesday Evening (Monthly: Volunteer Based).  $30 per visit, cash only  °[facility]  (919) 537-3737 for adults; Children under age 4, call Graduate Pediatric Dentistry at (919) [phone]. Children aged 4-14, please call (919) [phone] to request a pediatric application. ° Dental services are provided in all areas of dental care including fillings, crowns and bridges, complete and partial dentures, implants, gum treatment, root canals, and extractions. Preventive care is also provided. Treatment is provided to both adults and children. °Patients are selected via a lottery and there is often a waiting list. °  °[facility] [address], °[place] ° (336) 763-8833 [email or web address] °  °[facility] [address], [place], [place] (336)723-1848, Ext. 123 Second and Fourth Thursday of each month, opens at 6:30 AM; Clinic ends at 9 AM.  Patients are seen on a first-come first-served basis, and a limited number are seen during each clinic.  ° °[facility] ° [address], [place], [place] (336) 723-7904   Eligibility Requirements °You must have lived in [place], [place], or [place] counties for at least the last three months. °  You cannot be eligible for state or federal sponsored healthcare insurance, including Veterans Administration, Medicaid, or Medicare. °  You generally cannot be eligible for healthcare  insurance through your employer.  °  How to apply: °Eligibility screenings are held every Tuesday and Wednesday afternoon from 1:00 pm until 4:00 pm. You do not need an appointment for the interview!  °[facility] [address], [place], [place] 336-631-2330   °[facility]  336-342-8273   °[facility]  336-703-3100   °[facility]  336-570-6415   ° °Behavioral Health Resources in the Community: °Intensive Outpatient Programs °Organization         Address  Phone  Notes  °[facility] [address], [place], [place] 336-878-6098   °[facility] [address], [place], [place] 336-832-9800   °[facility] [address], [place], [place] ° 336-882-2125   °[facility] [address],  °[place], [place] 1-800-853-5163 or 336-641-4981   °Substance Abuse Resources °Organization         Address  Phone  Notes  °[facility]  336-882-2125   °[facility]  336-784-9470   °[facility]  336-285-9073   °[facility]  336-845-3988   °Residential & Outpatient Substance Abuse Program  1-800-659-3381   °Psychological Services °Organization         Address  Phone  Notes  °[facility]  336- 832-9600   °[facility]  336- 378-7881   °[facility] [address], [place] 1-800-853-5163 or 336-641-4981   ° °Mobile Crisis Teams °Organization         Address  Phone  Notes  °[facility], Mobile Crisis Care Unit  1-877-626-1772   °Assertive °[facility] ° [address] [place], [place] 336-834-9664   °Sharon DeEsch [address] °[place]  336-554-5454   ° °Self-Help/Support Groups °Organization         Address  Phone             Notes  °[facility]  - variety of support groups  336- 373-1402 Call for more information  °Narcotics Anonymous (NA),  [facility] [address], °[place]   2   meetings at this location  ° °Residential Treatment Programs °Organization         Address  Phone  Notes  °[facility] [address],    °[place]  1-866-801-8205   °[facility] ° [address], [place], [place] 704-293-8524   °[facility] [address], [place] 336-845-3988 Admissions: 8am-3pm M-F  °[facility] [address]    °[place], [place] 336-841-1104   °[facility] [address], [place], [place] 336-379-7146   °[facility] [address]  °[place], [place] 336-285-9073   °[facility] Intensive Outpatient [address], [place], [place] 336-852-3033   °[facility] [address]  °[place], [place] 1-877-615-2722 or 336-784-9470   °[facility] ([facility] [address] [place], [place] 336-227-7417 Accepts Medicaid  °[facility] [address]  ° [place] 1-800-659-3381 Substance Abuse/Addiction Treatment  ° °[place] Behavioral Health Resources °Organization         Address  Phone  Notes  °[facility]  (888) 581-9988   °Julie Brannon, PhD [address] [place], [place]   (336) 349-5553 or (336) 951-0000   °[facility]   [address] °[place], [place] (336) 349-4454   °[facility] [address], [place], [place] (336) 342-8316 Insurance/Medicaid/sponsorship through [facility]  °[facility] [address]                                    [place], [place] (336) 342-8316 Therapy/tele-psych/case  °[facility] [address]  ° [place], [place] (336) 349-2233    °Dr. Arfeen  (336) 349-4544   °[facility] [place]  [employer] [place] [facility] 1) [address], [place] °2) [address], [place] °3)  [address], [place] (336) 349-3220 °(336) 342-7768 ° °(336) 342-8140    °[place] [facility] (336) 342-1394 or (336) 342-3537 (After Hours)    ° ° ° °

## 2013-09-04 NOTE — ED Notes (Signed)
Per son, pt had multiple fall today. Pt admit of drinking tonight. Abrasion noted at chin and right elbow.

## 2013-09-04 NOTE — ED Provider Notes (Signed)
CSN: [id number]     Arrival date & time 09/04/13  2102 History   First MD Initiated Contact with Patient 09/04/13 2106     No chief complaint on file.    (Consider location/radiation/quality/duration/timing/severity/associated sxs/prior Treatment) HPI Comments: Patient brought to the emergency department I ambulance. Patient's son called EMS because the patient fell. He has reportedly fallen 4 times since getting home from the emergency department earlier this afternoon. Son reports that he is confused, disoriented which is unusual. The patient only intermittently answers questions and is disoriented upon arrival, Level V Caveat due to confusion. Information provided by EMS, as told to them by the son at the scene.   No past medical history on file. No past surgical history on file. No family history on file. History  Substance Use Topics  . Smoking status: Current Every Day Smoker -- 1.00 packs/day    Types: Cigarettes  . Smokeless tobacco: Not on file  . Alcohol Use: Yes     Comment: 6 pack a day/heavy    Review of Systems  Unable to perform ROS: Mental status change      Allergies  Review of patient's allergies indicates no known allergies.  Home Medications   Current Outpatient Rx  Name  Route  Sig  Dispense  Refill  . aspirin EC 81 MG tablet   Oral   Take 81 mg by mouth daily.          BP 132/95  Pulse 97  Temp(Src) 98 F (36.7 C) (Oral)  Resp 19  SpO2 92% Physical Exam  Constitutional: He appears well-developed and well-nourished. No distress.  HENT:  Head: Normocephalic. Head is with abrasion.    Right Ear: Hearing normal.  Left Ear: Hearing normal.  Nose: Nose normal.  Mouth/Throat: Oropharynx is clear and moist and mucous membranes are normal.  Eyes: Conjunctivae and EOM are normal. Pupils are equal, round, and reactive to light.  Neck: Normal range of motion. Neck supple.  Cardiovascular: Regular rhythm, S1 normal and S2 normal.  Exam reveals no  gallop and no friction rub.   No murmur heard. Pulmonary/Chest: Effort normal and breath sounds normal. No respiratory distress. He exhibits no tenderness.  Abdominal: Soft. Normal appearance and bowel sounds are normal. There is no hepatosplenomegaly. There is no tenderness. There is no rebound, no guarding, no tenderness at McBurney's point and negative Murphy's sign. No hernia.  Musculoskeletal: Normal range of motion.  Neurological: He is alert. He has normal strength. He is disoriented. No cranial nerve deficit or sensory deficit. Coordination normal. GCS eye subscore is 4. GCS verbal subscore is 4. GCS motor subscore is 6.  Skin: Skin is warm and dry. Abrasion noted. No rash noted. No cyanosis.  Psychiatric: He has a normal mood and affect. Thought content normal. His speech is delayed and slurred. He is slowed. He exhibits abnormal recent memory.    ED Course  Procedures (including critical care time) Labs Review Labs Reviewed - No data to display Imaging Review Dg Chest 2 View  09/04/2013   CLINICAL DATA:  Chest pain.  Shortness of breath.  EXAM: CHEST  2 VIEW  COMPARISON:  05/20/2013  FINDINGS: Heart size is normal. Mediastinal shadows are normal. The lungs are hyperinflated suggesting emphysema. No evidence of infiltrate, mass, effusion or collapse. Bony density related to the distal first ribs again noted.  IMPRESSION: No active cardiopulmonary disease.  Probable emphysema.   Electronically Signed   By: Paulina FusiMark  Shogry M.D.   On:  09/04/2013 09:18   Ct Abdomen Pelvis W Contrast  09/04/2013   CLINICAL DATA:  Chest and epigastric pain.  Acute worsening.  EXAM: CT ABDOMEN AND PELVIS WITH CONTRAST  TECHNIQUE: Multidetector CT imaging of the abdomen and pelvis was performed using the standard protocol following bolus administration of intravenous contrast.  CONTRAST:  OMNIPAQUE IOHEXOL 300 MG/ML  SOLN  COMPARISON:  Ultrasound studies 2008  FINDINGS: Lung bases show mild scarring.  No  consolidation.  No effusion.  The liver does not show any focal lesions. No calcified gallstones. The spleen is normal. The pancreas is normal. The adrenal glands are normal. The kidneys are normal. No cysts, mass, stone or hydronephrosis. The aorta and IVC are unremarkable. There is mild atherosclerosis of the aorta. Bladder, prostate gland and seminal vesicles appear unremarkable. There is an inguinal hernia on the right containing small intestine. No sign of strangulation or obstruction. The appendix is normal. No acute bowel pathology is visible. Ordinary mild degenerative changes affect the spine.  IMPRESSION: No acute finding. No cause of the pain is identified. The patient does have an inguinal hernia on the right containing small intestine but there is no evidence of obstruction or strangulation based on these images.   Electronically Signed   By: Paulina Fusi M.D.   On: 09/04/2013 12:56     EKG Interpretation None      MDM   Final diagnoses:  None   Patient arrives after a fall at home. He is somewhat tremulous and confused. Patient does have a history of alcoholism, but has not had a drink now an approximately 24 hours. I am concerned that this is the beginning stages of DTs in the patient. His examination currently seems different than what was described at his previous visit where he was more alert and oriented. Patient initiated on Ativan protocol. Lab work is unremarkable. CT head, cervical spine, axial facial bones was negative after the fall. Will require medical admission for further management of acute mental status changes, likely secondary to DTs.    Gilda Crease, MD 09/04/13 [phone]

## 2013-09-05 DIAGNOSIS — F10231 Alcohol dependence with withdrawal delirium: Secondary | ICD-10-CM | POA: Diagnosis present

## 2013-09-05 DIAGNOSIS — F10931 Alcohol use, unspecified with withdrawal delirium: Secondary | ICD-10-CM | POA: Diagnosis present

## 2013-09-05 DIAGNOSIS — M25569 Pain in unspecified knee: Secondary | ICD-10-CM | POA: Diagnosis not present

## 2013-09-05 DIAGNOSIS — W19XXXA Unspecified fall, initial encounter: Secondary | ICD-10-CM | POA: Diagnosis present

## 2013-09-05 DIAGNOSIS — E43 Unspecified severe protein-calorie malnutrition: Secondary | ICD-10-CM | POA: Diagnosis present

## 2013-09-05 DIAGNOSIS — R109 Unspecified abdominal pain: Secondary | ICD-10-CM | POA: Diagnosis present

## 2013-09-05 DIAGNOSIS — K047 Periapical abscess without sinus: Secondary | ICD-10-CM | POA: Diagnosis present

## 2013-09-05 DIAGNOSIS — D696 Thrombocytopenia, unspecified: Secondary | ICD-10-CM | POA: Diagnosis present

## 2013-09-05 DIAGNOSIS — R259 Unspecified abnormal involuntary movements: Secondary | ICD-10-CM | POA: Diagnosis present

## 2013-09-05 DIAGNOSIS — F1020 Alcohol dependence, uncomplicated: Secondary | ICD-10-CM | POA: Diagnosis present

## 2013-09-05 DIAGNOSIS — Z9181 History of falling: Secondary | ICD-10-CM | POA: Diagnosis not present

## 2013-09-05 DIAGNOSIS — Z781 Physical restraint status: Secondary | ICD-10-CM | POA: Diagnosis present

## 2013-09-05 DIAGNOSIS — M25469 Effusion, unspecified knee: Secondary | ICD-10-CM | POA: Diagnosis present

## 2013-09-05 DIAGNOSIS — E871 Hypo-osmolality and hyponatremia: Secondary | ICD-10-CM | POA: Diagnosis present

## 2013-09-05 DIAGNOSIS — Z681 Body mass index (BMI) 19 or less, adult: Secondary | ICD-10-CM | POA: Diagnosis not present

## 2013-09-05 DIAGNOSIS — M2340 Loose body in knee, unspecified knee: Secondary | ICD-10-CM | POA: Diagnosis present

## 2013-09-05 DIAGNOSIS — F17200 Nicotine dependence, unspecified, uncomplicated: Secondary | ICD-10-CM | POA: Diagnosis present

## 2013-09-05 DIAGNOSIS — M1710 Unilateral primary osteoarthritis, unspecified knee: Secondary | ICD-10-CM | POA: Diagnosis present

## 2013-09-05 DIAGNOSIS — Y92009 Unspecified place in unspecified non-institutional (private) residence as the place of occurrence of the external cause: Secondary | ICD-10-CM | POA: Diagnosis not present

## 2013-09-05 DIAGNOSIS — K7010 Alcoholic hepatitis without ascites: Secondary | ICD-10-CM | POA: Diagnosis present

## 2013-09-05 LAB — URINALYSIS, ROUTINE W REFLEX MICROSCOPIC
Glucose, UA: NEGATIVE mg/dL
Hgb urine dipstick: NEGATIVE
KETONES UR: 40 mg/dL — AB
LEUKOCYTES UA: NEGATIVE
NITRITE: NEGATIVE
PH: 6.5 (ref 5.0–8.0)
Protein, ur: NEGATIVE mg/dL
SPECIFIC GRAVITY, URINE: 1.038 — AB (ref 1.005–1.030)
Urobilinogen, UA: 2 mg/dL — ABNORMAL HIGH (ref 0.0–1.0)

## 2013-09-05 LAB — CBC
HCT: 38 % — ABNORMAL LOW (ref 39.0–52.0)
HEMOGLOBIN: 13.1 g/dL (ref 13.0–17.0)
MCH: 31.4 pg (ref 26.0–34.0)
MCHC: 34.5 g/dL (ref 30.0–36.0)
MCV: 91.1 fL (ref 78.0–100.0)
Platelets: 104 10*3/uL — ABNORMAL LOW (ref 150–400)
RBC: 4.17 MIL/uL — ABNORMAL LOW (ref 4.22–5.81)
RDW: 15.2 % (ref 11.5–15.5)
WBC: 5 10*3/uL (ref 4.0–10.5)

## 2013-09-05 LAB — COMPREHENSIVE METABOLIC PANEL
ALT: 43 U/L (ref 0–53)
AST: 100 U/L — ABNORMAL HIGH (ref 0–37)
Albumin: 2.9 g/dL — ABNORMAL LOW (ref 3.5–5.2)
Alkaline Phosphatase: 74 U/L (ref 39–117)
BUN: 10 mg/dL (ref 6–23)
CALCIUM: 8 mg/dL — AB (ref 8.4–10.5)
CHLORIDE: 98 meq/L (ref 96–112)
CO2: 19 meq/L (ref 19–32)
Creatinine, Ser: 0.92 mg/dL (ref 0.50–1.35)
GFR, EST NON AFRICAN AMERICAN: 90 mL/min — AB (ref >90)
GLUCOSE: 56 mg/dL — AB (ref 70–99)
POTASSIUM: 3.8 meq/L (ref 3.7–5.3)
SODIUM: 133 meq/L — AB (ref 137–147)
Total Bilirubin: 1.7 mg/dL — ABNORMAL HIGH (ref 0.3–1.2)
Total Protein: 6.1 g/dL (ref 6.0–8.3)

## 2013-09-05 LAB — RAPID URINE DRUG SCREEN, HOSP PERFORMED
Amphetamines: NOT DETECTED
BENZODIAZEPINES: NOT DETECTED
Barbiturates: NOT DETECTED
Cocaine: NOT DETECTED
Opiates: NOT DETECTED
Tetrahydrocannabinol: NOT DETECTED

## 2013-09-05 LAB — MRSA PCR SCREENING: MRSA by PCR: NEGATIVE

## 2013-09-05 MED ORDER — FOLIC ACID 1 MG PO TABS
1.0000 mg | ORAL_TABLET | Freq: Every day | ORAL | Status: DC
  Administered 2013-09-05 – 2013-09-10 (×6): 1 mg via ORAL
  Filled 2013-09-05 (×6): qty 1

## 2013-09-05 MED ORDER — LORAZEPAM 2 MG/ML IJ SOLN
2.0000 mg | INTRAMUSCULAR | Status: DC | PRN
  Administered 2013-09-05: 3 mg via INTRAVENOUS
  Administered 2013-09-05: 2 mg via INTRAVENOUS
  Administered 2013-09-05 (×4): 3 mg via INTRAVENOUS
  Administered 2013-09-06: 2 mg via INTRAVENOUS
  Administered 2013-09-06: 3 mg via INTRAVENOUS
  Filled 2013-09-05 (×5): qty 2
  Filled 2013-09-05: qty 1
  Filled 2013-09-05: qty 2
  Filled 2013-09-05: qty 1
  Filled 2013-09-05: qty 2

## 2013-09-05 MED ORDER — THIAMINE HCL 100 MG/ML IJ SOLN
Freq: Once | INTRAVENOUS | Status: AC
  Administered 2013-09-05: 03:00:00 via INTRAVENOUS
  Filled 2013-09-05: qty 1000

## 2013-09-05 MED ORDER — ASPIRIN EC 81 MG PO TBEC
81.0000 mg | DELAYED_RELEASE_TABLET | Freq: Every day | ORAL | Status: DC
  Administered 2013-09-05 – 2013-09-10 (×6): 81 mg via ORAL
  Filled 2013-09-05 (×6): qty 1

## 2013-09-05 MED ORDER — ENOXAPARIN SODIUM 40 MG/0.4ML ~~LOC~~ SOLN
40.0000 mg | Freq: Every day | SUBCUTANEOUS | Status: DC
  Administered 2013-09-05: 40 mg via SUBCUTANEOUS
  Filled 2013-09-05: qty 0.4

## 2013-09-05 MED ORDER — VITAMIN B-1 100 MG PO TABS
100.0000 mg | ORAL_TABLET | Freq: Every day | ORAL | Status: DC
  Administered 2013-09-05 – 2013-09-10 (×6): 100 mg via ORAL
  Filled 2013-09-05 (×6): qty 1

## 2013-09-05 MED ORDER — CLINDAMYCIN PHOSPHATE 600 MG/50ML IV SOLN
600.0000 mg | Freq: Three times a day (TID) | INTRAVENOUS | Status: DC
Start: 2013-09-05 — End: 2013-09-07
  Administered 2013-09-05 – 2013-09-07 (×7): 600 mg via INTRAVENOUS
  Filled 2013-09-05 (×9): qty 50

## 2013-09-05 MED ORDER — SODIUM CHLORIDE 0.9 % IV SOLN
INTRAVENOUS | Status: DC
  Administered 2013-09-05: 75 mL/h via INTRAVENOUS
  Administered 2013-09-05 – 2013-09-06 (×2): via INTRAVENOUS
  Administered 2013-09-07: 50 mL/h via INTRAVENOUS
  Administered 2013-09-09: 1000 mL via INTRAVENOUS
  Administered 2013-09-10: 07:00:00 via INTRAVENOUS

## 2013-09-05 MED ORDER — ONDANSETRON HCL 4 MG/2ML IJ SOLN: 4.0000 mg | Freq: Four times a day (QID) | INTRAMUSCULAR | Status: DC | PRN

## 2013-09-05 MED ORDER — ONDANSETRON HCL 4 MG PO TABS: 4.0000 mg | ORAL_TABLET | Freq: Four times a day (QID) | ORAL | Status: DC | PRN

## 2013-09-05 NOTE — Progress Notes (Signed)
[facility] TEAM 1 - Stepdown/ICU TEAM Progress Note  Gearlean AlfWilliam S Armijo ZOX:[id number] DOB: [birth year] DOA: 09/04/2013 PCP: No PCP Per Patient  Admit HPI / Brief Narrative: 63yo male with known etoh abuse and no etoh in 24 hours per ED report (pt not making any sense and family not available) who presented w/ worsening delerium and tremors. He was referring to horses, guns, etc.   In ED his VSS and labs were unremarkable. He was admitted for tx of etoh withdrawal.   HPI/Subjective: Pt seen for f/u eval.   Assessment/Plan:  Acute alcohol withdrawal  Mild hyponatremia  Mild thrombocytopenia  EtOH abuse/dependence   Periapical abscess at the root of the left second maxillary premolar  Code Status: FULL Family Communication: no family present at time of exam Disposition Plan: SDU   Consultants: none  Procedures: none  Antibiotics: Clindamycin 4/3 >>  DVT prophylaxis: lovenox  Objective: Blood pressure 121/90, pulse 82, temperature 98.8 F (37.1 C), temperature source Oral, resp. rate 26, height 6\' 3"  (1.905 m), weight 65.3 kg (143 lb 15.4 oz), SpO2 96.00%.  Intake/Output Summary (Last 24 hours) at 09/05/13 1417 Last data filed at 09/05/13 [id number]  Gross per 24 hour  Intake   1275 ml  Output    825 ml  Net    450 ml   Exam: F/U exam completed  Data Reviewed: Basic Metabolic Panel:  Recent Labs Lab 09/04/13 0840 09/04/13 2221 09/05/13 0541  NA 136* 134* 133*  K 4.2 4.4 3.8  CL 94* 95* 98  CO2 22 20 19   GLUCOSE 81 62* 56*  BUN 10 11 10   CREATININE 0.89 0.90 0.92  CALCIUM 9.0 9.0 8.0*   Liver Function Tests:  Recent Labs Lab 09/04/13 0843 09/05/13 0541  AST 93* 100*  ALT 51 43  ALKPHOS 102 74  BILITOT 2.3* 1.7*  PROT 7.5 6.1  ALBUMIN 3.5 2.9*    Recent Labs Lab 09/04/13 0843  LIPASE 18   No results found for this basename: AMMONIA,  in the last 168 hours  CBC:  Recent Labs Lab 09/04/13 0840 09/04/13 2221 09/05/13 0541  WBC 6.5 6.8  5.0  NEUTROABS  --  5.5  --   HGB 15.0 14.1 13.1  HCT 42.6 39.8 38.0*  MCV 90.3 90.0 91.1  PLT 139* 120* 104*   CBG:  Recent Labs Lab 09/04/13 2119  GLUCAP 73    Recent Results (from the past 240 hour(s))  MRSA PCR SCREENING     Status: None   Collection Time    09/05/13  2:38 AM      Result Value Ref Range Status   MRSA by PCR NEGATIVE  NEGATIVE Final   Comment:            The GeneXpert MRSA Assay (FDA     approved for NASAL specimens     only), is one component of a     comprehensive MRSA colonization     surveillance program. It is not     intended to diagnose MRSA     infection nor to guide or     monitor treatment for     MRSA infections.     Studies:  Recent x-ray studies have been reviewed in detail by the Attending Physician  Scheduled Meds:  Scheduled Meds: . aspirin EC  81 mg Oral Daily  . clindamycin (CLEOCIN) IV  600 mg Intravenous 3 times per day  . enoxaparin (LOVENOX) injection  40 mg Subcutaneous Daily  .  folic acid  1 mg Oral Daily  . thiamine  100 mg Oral Daily    Time spent on care of this patient: 25+ mins   [facility] T  [facility] Office  [phone] Pager - Text Page per Loretha Stapler as per below:  On-Call/Text Page:      [email or web address]      password [email or web address]  If 7PM-7AM, please contact night-coverage [email or web address] Password [email or web address] 09/05/2013, 2:17 PM   LOS: 1 day

## 2013-09-05 NOTE — Progress Notes (Signed)
Utilization review completed.  

## 2013-09-05 NOTE — Plan of Care (Signed)
Maxillofacial CT revealed dental abscess: Periapical abscess at the root of the left second maxillary premolar, with partial absence of the tooth. This demonstrates cortical breakthrough, without significant overlying soft tissue inflammation. Partial absence of the left lateral maxillary incisor, and small periapical abscess with regard to the right first mandibular molar. Started on IV Cleocin  Junious SilkAllison Ellis, ANP

## 2013-09-05 NOTE — H&P (Signed)
[facility]  History and Physical Polette Nofsinger L. Link Snuffer, MD Pager [phone] (if 7P to 7A, page night hospitalist on [email or web address] FREDIANI JYN:[id number] DOB: [birth year] DOA: 09/04/2013  Referring physician: ED PCP: No PCP Per Patient   Chief Complaint: hallucinations, tremors   HPI:  Pt with known etoh abuse and no etoh in 24 hours per ED report (pt not making any sense and family not available) presents w/ worsening delerium and tremors. He is referring to horses,guns, etc. He does know he is in hospital but it is because he was told to report here.  In ED his VSS and labs are unremarkable. Most likely etiology at this point is etoh withdrawal.   Chart Review:  ED notes and labs   Review of Systems:  Neg except as noted above   History reviewed. No pertinent past medical history.  History reviewed. No pertinent past surgical history.  Social History:  reports that he has been smoking Cigarettes.  He has been smoking about 1.00 pack per day. He does not have any smokeless tobacco history on file. He reports that he drinks alcohol. He reports that he does not use illicit drugs.  No Known Allergies  History reviewed. No pertinent family history.   Prior to Admission medications   Medication Sig Start Date End Date Taking? Authorizing Provider  aspirin EC 81 MG tablet Take 81 mg by mouth daily.   Yes Historical Provider, MD   Physical Exam: Filed Vitals:   09/04/13 2330 09/04/13 2345 09/05/13 0000 09/05/13 0015  BP: 145/91 142/80 142/89 129/91  Pulse:  80 94 85  Temp:      TempSrc:      Resp:  23 23 24   SpO2:  100% 94% 95%     General: AAM , tremulous , oriented to place only   Eyes: anicteric   ENT: trachea midline   Neck: no JVD  Cardiovascular: RRR, no MRG   Respiratory:  CTAB, no wheezes/rales   Abdomen: soft, NT, ND   Skin: warm, dry   Musculoskeletal: no focal deficits   Psychiatric: not oriented to situation. Hallucinating   Neurologic:  no focal deficits but not cooperative w/ exam   Wt Readings from Last 3 Encounters:  No data found for Wt    Labs on Admission:  Basic Metabolic Panel:  Recent Labs Lab 09/04/13 0840 09/04/13 2221  NA 136* 134*  K 4.2 4.4  CL 94* 95*  CO2 22 20  GLUCOSE 81 62*  BUN 10 11  CREATININE 0.89 0.90  CALCIUM 9.0 9.0    Liver Function Tests:  Recent Labs Lab 09/04/13 0843  AST 93*  ALT 51  ALKPHOS 102  BILITOT 2.3*  PROT 7.5  ALBUMIN 3.5    Recent Labs Lab 09/04/13 0843  LIPASE 18   No results found for this basename: AMMONIA,  in the last 168 hours  CBC:  Recent Labs Lab 09/04/13 0840 09/04/13 2221  WBC 6.5 6.8  NEUTROABS  --  5.5  HGB 15.0 14.1  HCT 42.6 39.8  MCV 90.3 90.0  PLT 139* 120*    Cardiac Enzymes: No results found for this basename: CKTOTAL, CKMB, CKMBINDEX, TROPONINI,  in the last 168 hours  Troponin (Point of Care Test)  Recent Labs  09/04/13 1338  TROPIPOC 0.00    BNP (last 3 results)  Recent Labs  09/04/13 0858  PROBNP 439.0*    CBG:  Recent Labs Lab 09/04/13 2119  GLUCAP 73  Radiological Exams on Admission: Dg Chest 2 View  09/04/2013   CLINICAL DATA:  Status post fall; altered mental status.  EXAM: CHEST  2 VIEW  COMPARISON:  Chest radiograph performed earlier today at 9:05 a.m.  FINDINGS: The lungs are well-aerated. Minimal left-sided atelectasis is noted. There is no evidence of pleural effusion or pneumothorax.  The heart is normal in size; the mediastinal contour is within normal limits. No acute osseous abnormalities are seen.  IMPRESSION: Minimal left-sided atelectasis noted; lungs otherwise clear.   Electronically Signed   By: Roanna Raider M.D.   On: 09/04/2013 22:12   Dg Chest 2 View  09/04/2013   CLINICAL DATA:  Chest pain.  Shortness of breath.  EXAM: CHEST  2 VIEW  COMPARISON:  05/20/2013  FINDINGS: Heart size is normal. Mediastinal shadows are normal. The lungs are hyperinflated suggesting emphysema.  No evidence of infiltrate, mass, effusion or collapse. Bony density related to the distal first ribs again noted.  IMPRESSION: No active cardiopulmonary disease.  Probable emphysema.   Electronically Signed   By: Paulina Fusi M.D.   On: 09/04/2013 09:18   Ct Head Wo Contrast  09/04/2013   CLINICAL DATA:  Status post fall; bilateral temporal headache, nasal pain and laceration at the chin. Concern for neck injury.  EXAM: CT HEAD WITHOUT CONTRAST  CT MAXILLOFACIAL WITHOUT CONTRAST  CT CERVICAL SPINE WITHOUT CONTRAST  TECHNIQUE: Multidetector CT imaging of the head, cervical spine, and maxillofacial structures were performed using the standard protocol without intravenous contrast. Multiplanar CT image reconstructions of the cervical spine and maxillofacial structures were also generated.  COMPARISON:  None.  FINDINGS: CT HEAD FINDINGS  There is no evidence of acute infarction, mass lesion, or intra- or extra-axial hemorrhage on CT.  Scattered periventricular and subcortical white matter change likely reflects small vessel ischemic microangiopathy. Mild increased density along the falx cerebri is thought to reflect calcification; no definite hemorrhage is seen.  The posterior fossa, including the cerebellum, brainstem and fourth ventricle, is within normal limits. The third and lateral ventricles, and basal ganglia are unremarkable in appearance. The cerebral hemispheres are symmetric in appearance, with normal gray-white differentiation. No mass effect or midline shift is seen.  There is no evidence of fracture; visualized osseous structures are unremarkable in appearance. The orbits are within normal limits. Mucosal thickening is noted at the left maxillary sinus. The remaining paranasal sinuses and mastoid air cells are well-aerated. No significant soft tissue abnormalities are seen.  CT MAXILLOFACIAL FINDINGS  There is mild flattening of the left side of the nasal bone, which could reflect an acute fracture,  though it could also be chronic in nature. There is no additional evidence for acute fracture. The maxilla and mandible appear intact. The nasal bone is unremarkable in appearance. A periapical abscess is noted at the root of the left second maxillary premolar, which is also largely absent. This demonstrates cortical breakthrough, without significant overlying soft tissue inflammation. There is also partial absence of the left lateral maxillary incisor, and a small periapical abscess with regard to the right first mandibular molar.  The orbits are intact bilaterally. There is mucosal thickening within the left maxillary sinus. The remaining visualized paranasal sinuses and mastoid air cells are well-aerated.  No significant soft tissue abnormalities are seen. The parapharyngeal fat planes are preserved. The nasopharynx, oropharynx and hypopharynx are unremarkable in appearance. The visualized portions of the valleculae and piriform sinuses are grossly unremarkable.  The parotid and submandibular glands are within normal  limits. No cervical lymphadenopathy is seen.  CT CERVICAL SPINE FINDINGS  There is no evidence of fracture or subluxation. Vertebral bodies demonstrate normal height and alignment. Multilevel disc space narrowing is noted along the cervical spine, particularly at C5-C6, with associated anterior and posterior disc osteophyte complexes and underlying facet disease. Prevertebral soft tissues are within normal limits.  The thyroid gland is unremarkable in appearance. Emphysema is noted at the upper lung lobes. No significant soft tissue abnormalities are seen.  IMPRESSION: 1. No evidence of traumatic intracranial injury or fracture. 2. No evidence of fracture or subluxation along the cervical spine. 3. Mild flattening of the left side of the nasal bone could reflect an acute fracture, though it could also be chronic in nature. 4. Periapical abscess at the root of the left second maxillary premolar, with  partial absence of the tooth. This demonstrates cortical breakthrough, without significant overlying soft tissue inflammation. Partial absence of the left lateral maxillary incisor, and small periapical abscess with regard to the right first mandibular molar. 5. Mucosal thickening within the left maxillary sinus. 6. Scattered small vessel ischemic microangiopathy. 7. Mild degenerative change along the cervical spine. 8. Emphysema at the upper lung lobes.   Electronically Signed   By: Roanna RaiderJeffery  Chang M.D.   On: 09/04/2013 22:38   Ct Cervical Spine Wo Contrast  09/04/2013   CLINICAL DATA:  Status post fall; bilateral temporal headache, nasal pain and laceration at the chin. Concern for neck injury.  EXAM: CT HEAD WITHOUT CONTRAST  CT MAXILLOFACIAL WITHOUT CONTRAST  CT CERVICAL SPINE WITHOUT CONTRAST  TECHNIQUE: Multidetector CT imaging of the head, cervical spine, and maxillofacial structures were performed using the standard protocol without intravenous contrast. Multiplanar CT image reconstructions of the cervical spine and maxillofacial structures were also generated.  COMPARISON:  None.  FINDINGS: CT HEAD FINDINGS  There is no evidence of acute infarction, mass lesion, or intra- or extra-axial hemorrhage on CT.  Scattered periventricular and subcortical white matter change likely reflects small vessel ischemic microangiopathy. Mild increased density along the falx cerebri is thought to reflect calcification; no definite hemorrhage is seen.  The posterior fossa, including the cerebellum, brainstem and fourth ventricle, is within normal limits. The third and lateral ventricles, and basal ganglia are unremarkable in appearance. The cerebral hemispheres are symmetric in appearance, with normal gray-white differentiation. No mass effect or midline shift is seen.  There is no evidence of fracture; visualized osseous structures are unremarkable in appearance. The orbits are within normal limits. Mucosal thickening is  noted at the left maxillary sinus. The remaining paranasal sinuses and mastoid air cells are well-aerated. No significant soft tissue abnormalities are seen.  CT MAXILLOFACIAL FINDINGS  There is mild flattening of the left side of the nasal bone, which could reflect an acute fracture, though it could also be chronic in nature. There is no additional evidence for acute fracture. The maxilla and mandible appear intact. The nasal bone is unremarkable in appearance. A periapical abscess is noted at the root of the left second maxillary premolar, which is also largely absent. This demonstrates cortical breakthrough, without significant overlying soft tissue inflammation. There is also partial absence of the left lateral maxillary incisor, and a small periapical abscess with regard to the right first mandibular molar.  The orbits are intact bilaterally. There is mucosal thickening within the left maxillary sinus. The remaining visualized paranasal sinuses and mastoid air cells are well-aerated.  No significant soft tissue abnormalities are seen. The parapharyngeal fat planes are  preserved. The nasopharynx, oropharynx and hypopharynx are unremarkable in appearance. The visualized portions of the valleculae and piriform sinuses are grossly unremarkable.  The parotid and submandibular glands are within normal limits. No cervical lymphadenopathy is seen.  CT CERVICAL SPINE FINDINGS  There is no evidence of fracture or subluxation. Vertebral bodies demonstrate normal height and alignment. Multilevel disc space narrowing is noted along the cervical spine, particularly at C5-C6, with associated anterior and posterior disc osteophyte complexes and underlying facet disease. Prevertebral soft tissues are within normal limits.  The thyroid gland is unremarkable in appearance. Emphysema is noted at the upper lung lobes. No significant soft tissue abnormalities are seen.  IMPRESSION: 1. No evidence of traumatic intracranial injury or  fracture. 2. No evidence of fracture or subluxation along the cervical spine. 3. Mild flattening of the left side of the nasal bone could reflect an acute fracture, though it could also be chronic in nature. 4. Periapical abscess at the root of the left second maxillary premolar, with partial absence of the tooth. This demonstrates cortical breakthrough, without significant overlying soft tissue inflammation. Partial absence of the left lateral maxillary incisor, and small periapical abscess with regard to the right first mandibular molar. 5. Mucosal thickening within the left maxillary sinus. 6. Scattered small vessel ischemic microangiopathy. 7. Mild degenerative change along the cervical spine. 8. Emphysema at the upper lung lobes.   Electronically Signed   By: Roanna Raider M.D.   On: 09/04/2013 22:38   Ct Abdomen Pelvis W Contrast  09/04/2013   CLINICAL DATA:  Chest and epigastric pain.  Acute worsening.  EXAM: CT ABDOMEN AND PELVIS WITH CONTRAST  TECHNIQUE: Multidetector CT imaging of the abdomen and pelvis was performed using the standard protocol following bolus administration of intravenous contrast.  CONTRAST:  OMNIPAQUE IOHEXOL 300 MG/ML  SOLN  COMPARISON:  Ultrasound studies 2008  FINDINGS: Lung bases show mild scarring.  No consolidation.  No effusion.  The liver does not show any focal lesions. No calcified gallstones. The spleen is normal. The pancreas is normal. The adrenal glands are normal. The kidneys are normal. No cysts, mass, stone or hydronephrosis. The aorta and IVC are unremarkable. There is mild atherosclerosis of the aorta. Bladder, prostate gland and seminal vesicles appear unremarkable. There is an inguinal hernia on the right containing small intestine. No sign of strangulation or obstruction. The appendix is normal. No acute bowel pathology is visible. Ordinary mild degenerative changes affect the spine.  IMPRESSION: No acute finding. No cause of the pain is identified. The  patient does have an inguinal hernia on the right containing small intestine but there is no evidence of obstruction or strangulation based on these images.   Electronically Signed   By: Paulina Fusi M.D.   On: 09/04/2013 12:56   Ct Maxillofacial Wo Cm  09/04/2013   CLINICAL DATA:  Status post fall; bilateral temporal headache, nasal pain and laceration at the chin. Concern for neck injury.  EXAM: CT HEAD WITHOUT CONTRAST  CT MAXILLOFACIAL WITHOUT CONTRAST  CT CERVICAL SPINE WITHOUT CONTRAST  TECHNIQUE: Multidetector CT imaging of the head, cervical spine, and maxillofacial structures were performed using the standard protocol without intravenous contrast. Multiplanar CT image reconstructions of the cervical spine and maxillofacial structures were also generated.  COMPARISON:  None.  FINDINGS: CT HEAD FINDINGS  There is no evidence of acute infarction, mass lesion, or intra- or extra-axial hemorrhage on CT.  Scattered periventricular and subcortical white matter change likely reflects small vessel ischemic  microangiopathy. Mild increased density along the falx cerebri is thought to reflect calcification; no definite hemorrhage is seen.  The posterior fossa, including the cerebellum, brainstem and fourth ventricle, is within normal limits. The third and lateral ventricles, and basal ganglia are unremarkable in appearance. The cerebral hemispheres are symmetric in appearance, with normal gray-white differentiation. No mass effect or midline shift is seen.  There is no evidence of fracture; visualized osseous structures are unremarkable in appearance. The orbits are within normal limits. Mucosal thickening is noted at the left maxillary sinus. The remaining paranasal sinuses and mastoid air cells are well-aerated. No significant soft tissue abnormalities are seen.  CT MAXILLOFACIAL FINDINGS  There is mild flattening of the left side of the nasal bone, which could reflect an acute fracture, though it could also be  chronic in nature. There is no additional evidence for acute fracture. The maxilla and mandible appear intact. The nasal bone is unremarkable in appearance. A periapical abscess is noted at the root of the left second maxillary premolar, which is also largely absent. This demonstrates cortical breakthrough, without significant overlying soft tissue inflammation. There is also partial absence of the left lateral maxillary incisor, and a small periapical abscess with regard to the right first mandibular molar.  The orbits are intact bilaterally. There is mucosal thickening within the left maxillary sinus. The remaining visualized paranasal sinuses and mastoid air cells are well-aerated.  No significant soft tissue abnormalities are seen. The parapharyngeal fat planes are preserved. The nasopharynx, oropharynx and hypopharynx are unremarkable in appearance. The visualized portions of the valleculae and piriform sinuses are grossly unremarkable.  The parotid and submandibular glands are within normal limits. No cervical lymphadenopathy is seen.  CT CERVICAL SPINE FINDINGS  There is no evidence of fracture or subluxation. Vertebral bodies demonstrate normal height and alignment. Multilevel disc space narrowing is noted along the cervical spine, particularly at C5-C6, with associated anterior and posterior disc osteophyte complexes and underlying facet disease. Prevertebral soft tissues are within normal limits.  The thyroid gland is unremarkable in appearance. Emphysema is noted at the upper lung lobes. No significant soft tissue abnormalities are seen.  IMPRESSION: 1. No evidence of traumatic intracranial injury or fracture. 2. No evidence of fracture or subluxation along the cervical spine. 3. Mild flattening of the left side of the nasal bone could reflect an acute fracture, though it could also be chronic in nature. 4. Periapical abscess at the root of the left second maxillary premolar, with partial absence of the  tooth. This demonstrates cortical breakthrough, without significant overlying soft tissue inflammation. Partial absence of the left lateral maxillary incisor, and small periapical abscess with regard to the right first mandibular molar. 5. Mucosal thickening within the left maxillary sinus. 6. Scattered small vessel ischemic microangiopathy. 7. Mild degenerative change along the cervical spine. 8. Emphysema at the upper lung lobes.   Electronically Signed   By: Roanna Raider M.D.   On: 09/04/2013 22:38       Active Problems:   DTs (delirium tremens)   Assessment/Plan 1. DTs - admit to SDU w/ CIWA protocol in place. Administer banana bag for thiamine, etc. Daily vitamins. If no improvement in 24 hours or if has fever or develops leukocytosis, consider LP to ensure encephalitis not present, although this would be atypical presentation   Code Status: full Family Communication: none  Disposition Plan/Anticipated LOS: 3-5 days   Time spent: 45 minutes   Alysia Penna, MD  Internal Medicine Pager [phone] If  7PM-7AM, please contact night-coverage at [email or web address], password [facility] 09/05/2013, 12:42 AM

## 2013-09-06 DIAGNOSIS — E43 Unspecified severe protein-calorie malnutrition: Secondary | ICD-10-CM | POA: Diagnosis present

## 2013-09-06 LAB — COMPREHENSIVE METABOLIC PANEL
ALK PHOS: 72 U/L (ref 39–117)
ALT: 48 U/L (ref 0–53)
AST: 99 U/L — ABNORMAL HIGH (ref 0–37)
Albumin: 2.7 g/dL — ABNORMAL LOW (ref 3.5–5.2)
BUN: 7 mg/dL (ref 6–23)
CO2: 20 mEq/L (ref 19–32)
CREATININE: 0.84 mg/dL (ref 0.50–1.35)
Calcium: 8.2 mg/dL — ABNORMAL LOW (ref 8.4–10.5)
Chloride: 97 mEq/L (ref 96–112)
GFR calc non Af Amer: >90 mL/min (ref >90)
Glucose, Bld: 79 mg/dL (ref 70–99)
Potassium: 3.7 mEq/L (ref 3.7–5.3)
Sodium: 135 mEq/L — ABNORMAL LOW (ref 137–147)
TOTAL PROTEIN: 5.9 g/dL — AB (ref 6.0–8.3)
Total Bilirubin: 1.3 mg/dL — ABNORMAL HIGH (ref 0.3–1.2)

## 2013-09-06 LAB — CBC
HCT: 36.2 % — ABNORMAL LOW (ref 39.0–52.0)
Hemoglobin: 12.6 g/dL — ABNORMAL LOW (ref 13.0–17.0)
MCH: 31.2 pg (ref 26.0–34.0)
MCHC: 34.8 g/dL (ref 30.0–36.0)
MCV: 89.6 fL (ref 78.0–100.0)
PLATELETS: 123 10*3/uL — AB (ref 150–400)
RBC: 4.04 MIL/uL — AB (ref 4.22–5.81)
RDW: 14.7 % (ref 11.5–15.5)
WBC: 4.2 10*3/uL (ref 4.0–10.5)

## 2013-09-06 LAB — AMMONIA: AMMONIA: 30 umol/L (ref 11–60)

## 2013-09-06 LAB — PROTIME-INR
INR: 0.96 (ref 0.00–1.49)
PROTHROMBIN TIME: 12.6 s (ref 11.6–15.2)

## 2013-09-06 MED ORDER — ENOXAPARIN SODIUM 40 MG/0.4ML ~~LOC~~ SOLN
40.0000 mg | SUBCUTANEOUS | Status: DC
  Administered 2013-09-06 – 2013-09-09 (×4): 40 mg via SUBCUTANEOUS
  Filled 2013-09-06 (×6): qty 0.4

## 2013-09-06 MED ORDER — ENSURE COMPLETE PO LIQD
237.0000 mL | Freq: Two times a day (BID) | ORAL | Status: DC
  Administered 2013-09-06 – 2013-09-10 (×9): 237 mL via ORAL

## 2013-09-06 NOTE — Progress Notes (Addendum)
INITIAL NUTRITION ASSESSMENT  DOCUMENTATION CODES Per approved criteria  -Severe malnutrition in the context of chronic illness -Underweight   INTERVENTION:  Strawberry Ensure Complete po BID, each supplement provides 350 kcal and 13 grams of protein RD to follow for nutrition care plan  NUTRITION DIAGNOSIS: Inadequate oral intake related to poor appetite, ETOH withdrawal as evidenced by PO intake 20%  Goal: Pt to meet >/= 90% of their estimated nutrition needs   Monitor:  PO & supplemental intake, weight, labs, I/O's  Reason for Assessment: Malnutrition Screening Tool Report  63 y.o. male  Admitting Dx: hallucinations, tremors   ASSESSMENT: Pt with known ETOH abuse presented w/ worsening delerium and tremors. In ED his VSS and labs were unremarkable; etiology likely ETOH withdrawal.   Patient somewhat confused upon RD visit; sitter at bedside; per sitter, pt only consumed ~ 20% of breakfast this AM; pt reports a 15 lb weight loss in the past 3 weeks; RD questions accuracy; pt more than likely not meeting protein needs given ETOH abuse; + muscle and fat wasting; pt would like Strawberry Ensure Complete -- RD to order.  Nutrition Focused Physical Exam:  Subcutaneous Fat:  Orbital Region: N/A Upper Arm Region: moderate depletion Thoracic and Lumbar Region: N/A  Muscle:  Temple Region: moderate depletion Clavicle Bone Region: severe depletion Clavicle and Acromion Bone Region: severe depletion Scapular Bone Region: N/A Dorsal Hand: N/A Patellar Region: N/A Anterior Thigh Region: N/A Posterior Calf Region: N/A  Edema: none  Patient meets criteria for severe malnutrition in the context of chronic illness as evidenced by < 75% intake of estimated energy requirement for > 1 month and moderate-severe muscle & subcutaneous fat loss.  Height: Ht Readings from Last 1 Encounters:  09/05/13 6\' 3"  (1.905 m)    Weight: Wt Readings from Last 1 Encounters:  09/05/13 143 lb  15.4 oz (65.3 kg)    Ideal Body Weight: 196 lb  % Ideal Body Weight: 73%  Wt Readings from Last 10 Encounters:  09/05/13 143 lb 15.4 oz (65.3 kg)    Usual Body Weight: unknown  % Usual Body Weight: ---  BMI:  Body mass index is 17.99 kg/(m^2).  Estimated Nutritional Needs: Kcal: 2000-2200 Protein: 100-110 gm Fluid: 2.0-2.2 L  Skin: Intact  Diet Order: General  EDUCATION NEEDS: -No education needs identified at this time   Intake/Output Summary (Last 24 hours) at 09/06/13 1224 Last data filed at 09/06/13 1135  Gross per 24 hour  Intake   1655 ml  Output   3600 ml  Net  -1945 ml   Labs:   Recent Labs Lab 09/04/13 2221 09/05/13 0541 09/06/13 0307  NA 134* 133* 135*  K 4.4 3.8 3.7  CL 95* 98 97  CO2 20 19 20   BUN 11 10 7   CREATININE 0.90 0.92 0.84  CALCIUM 9.0 8.0* 8.2*  GLUCOSE 62* 56* 79    CBG (last 3)   Recent Labs  09/04/13 2119  GLUCAP 73    Scheduled Meds: . aspirin EC  81 mg Oral Daily  . clindamycin (CLEOCIN) IV  600 mg Intravenous 3 times per day  . folic acid  1 mg Oral Daily  . thiamine  100 mg Oral Daily    Continuous Infusions: . sodium chloride 75 mL/hr at 09/06/13 1054    History reviewed. No pertinent past medical history.  History reviewed. No pertinent past surgical history.  Maureen ChattersKatie Findley Vi, RD, LDN Pager #: [phone] After-Hours Pager #: [phone]

## 2013-09-06 NOTE — Progress Notes (Signed)
[place] TEAM 1 - Stepdown/ICU TEAM Progress Note  ATA PECHA ZOX:[id number] DOB: 05-Nov-1950 DOA: 09/04/2013 PCP: No PCP Per Patient  Admit HPI / Brief Narrative: 63yo male with known etoh abuse and no etoh in 24 hours per ED report (pt not making any sense and family not available) who presented w/ worsening delerium and tremors. He was referring to horses, guns, etc.   In ED his VSS and labs were unremarkable. He was admitted for tx of etoh withdrawal.   HPI/Subjective: Pt is more alert today, but remains quite confused and intermittently combative.  Is mildly tremulous.    Assessment/Plan:  Acute alcohol withdrawal Cont w/ CIWA protocol and close monitoring in the SDU  Mild hyponatremia Na is improving - likely due to EtOH abuse   Mild thrombocytopenia Plts are improving   EtOH abuse/dependence  coags are normal - no evidence of severe liver dysfxn at present  Dental abscesses  On empiric abx coverage  Code Status: FULL Family Communication: no family present at time of exam Disposition Plan: SDU   Consultants: none  Procedures: none  Antibiotics: Clindamycin 4/3 >>  DVT prophylaxis: lovenox  Objective: Blood pressure 140/89, pulse 65, temperature 97.6 F (36.4 C), temperature source Oral, resp. rate 16, height 6\' 3"  (1.905 m), weight 65.3 kg (143 lb 15.4 oz), SpO2 96.00%.  Intake/Output Summary (Last 24 hours) at 09/06/13 1051 Last data filed at 09/06/13 0700  Gross per 24 hour  Intake   1175 ml  Output   3100 ml  Net  -1925 ml   Exam: General: No acute respiratory distress Lungs: Clear to auscultation bilaterally without wheezes or crackles Cardiovascular: Regular rate and rhythm without murmur gallop or rub normal S1 and S2 Abdomen: Nontender, nondistended, soft, bowel sounds positive, no rebound, no ascites, no appreciable mass Extremities: No significant cyanosis, clubbing, or edema bilateral lower extremities  Data Reviewed: Basic  Metabolic Panel:  Recent Labs Lab 09/04/13 0840 09/04/13 2221 09/05/13 0541 09/06/13 0307  NA 136* 134* 133* 135*  K 4.2 4.4 3.8 3.7  CL 94* 95* 98 97  CO2 22 20 19 20   GLUCOSE 81 62* 56* 79  BUN 10 11 10 7   CREATININE 0.89 0.90 0.92 0.84  CALCIUM 9.0 9.0 8.0* 8.2*   Liver Function Tests:  Recent Labs Lab 09/04/13 0843 09/05/13 0541 09/06/13 0307  AST 93* 100* 99*  ALT 51 43 48  ALKPHOS 102 74 72  BILITOT 2.3* 1.7* 1.3*  PROT 7.5 6.1 5.9*  ALBUMIN 3.5 2.9* 2.7*    Recent Labs Lab 09/04/13 0843  LIPASE 18    Recent Labs Lab 09/06/13 0307  AMMONIA 30   CBC:  Recent Labs Lab 09/04/13 0840 09/04/13 2221 09/05/13 0541 09/06/13 0307  WBC 6.5 6.8 5.0 4.2  NEUTROABS  --  5.5  --   --   HGB 15.0 14.1 13.1 12.6*  HCT 42.6 39.8 38.0* 36.2*  MCV 90.3 90.0 91.1 89.6  PLT 139* 120* 104* 123*   CBG:  Recent Labs Lab 09/04/13 2119  GLUCAP 73    Recent Results (from the past 240 hour(s))  MRSA PCR SCREENING     Status: None   Collection Time    09/05/13  2:38 AM      Result Value Ref Range Status   MRSA by PCR NEGATIVE  NEGATIVE Final   Comment:            The GeneXpert MRSA Assay (FDA     approved for  NASAL specimens     only), is one component of a     comprehensive MRSA colonization     surveillance program. It is not     intended to diagnose MRSA     infection nor to guide or     monitor treatment for     MRSA infections.     Studies:  Recent x-ray studies have been reviewed in detail by the Attending Physician  Scheduled Meds:  Scheduled Meds: . aspirin EC  81 mg Oral Daily  . clindamycin (CLEOCIN) IV  600 mg Intravenous 3 times per day  . folic acid  1 mg Oral Daily  . thiamine  100 mg Oral Daily    Time spent on care of this patient: 35 mins   [facility] AtlantaMCCLUNG,Akua Blethen T  [facility] Office  [phone] Pager - Text Page per Loretha StaplerAmion as per below:  On-Call/Text Page:      [email or web address]      password [email or web address]  If 7PM-7AM, please  contact night-coverage [email or web address] Password [email or web address] 09/06/2013, 10:51 AM   LOS: 2 days

## 2013-09-07 LAB — COMPREHENSIVE METABOLIC PANEL
ALBUMIN: 2.8 g/dL — AB (ref 3.5–5.2)
ALT: 49 U/L (ref 0–53)
AST: 78 U/L — AB (ref 0–37)
Alkaline Phosphatase: 73 U/L (ref 39–117)
BILIRUBIN TOTAL: 1.2 mg/dL (ref 0.3–1.2)
BUN: 8 mg/dL (ref 6–23)
CO2: 20 meq/L (ref 19–32)
CREATININE: 0.87 mg/dL (ref 0.50–1.35)
Calcium: 8.2 mg/dL — ABNORMAL LOW (ref 8.4–10.5)
Chloride: 94 mEq/L — ABNORMAL LOW (ref 96–112)
GFR calc Af Amer: >90 mL/min (ref >90)
Glucose, Bld: 113 mg/dL — ABNORMAL HIGH (ref 70–99)
Potassium: 3.7 mEq/L (ref 3.7–5.3)
SODIUM: 132 meq/L — AB (ref 137–147)
Total Protein: 6 g/dL (ref 6.0–8.3)

## 2013-09-07 LAB — CBC
HCT: 36.9 % — ABNORMAL LOW (ref 39.0–52.0)
Hemoglobin: 13.1 g/dL (ref 13.0–17.0)
MCH: 31.5 pg (ref 26.0–34.0)
MCHC: 35.5 g/dL (ref 30.0–36.0)
MCV: 88.7 fL (ref 78.0–100.0)
Platelets: 129 10*3/uL — ABNORMAL LOW (ref 150–400)
RBC: 4.16 MIL/uL — AB (ref 4.22–5.81)
RDW: 14.7 % (ref 11.5–15.5)
WBC: 6.4 10*3/uL (ref 4.0–10.5)

## 2013-09-07 MED ORDER — LORAZEPAM 2 MG/ML IJ SOLN
2.0000 mg | INTRAMUSCULAR | Status: DC | PRN
Start: 2013-09-07 — End: 2013-09-07

## 2013-09-07 MED ORDER — LORAZEPAM 2 MG/ML IJ SOLN: 2.0000 mg | INTRAMUSCULAR | Status: DC | PRN

## 2013-09-07 MED ORDER — CLINDAMYCIN HCL 300 MG PO CAPS
300.0000 mg | ORAL_CAPSULE | Freq: Three times a day (TID) | ORAL | Status: DC
  Administered 2013-09-07 – 2013-09-10 (×10): 300 mg via ORAL
  Filled 2013-09-07 (×12): qty 1

## 2013-09-07 MED ORDER — IBUPROFEN 400 MG PO TABS
400.0000 mg | ORAL_TABLET | Freq: Once | ORAL | Status: AC
  Administered 2013-09-07: 400 mg via ORAL
  Filled 2013-09-07: qty 1

## 2013-09-07 NOTE — Progress Notes (Signed)
NURSING PROGRESS NOTE  Alfred [name] [id number] Transfer Data: 09/07/2013 8:00 PM Attending Provider: Lonia BloodJeffrey T McClung, MD PCP:No PCP Per Patient Code Status: FUll  Alfred [name] is a 63 y.o. male patient transferred from [address] -No acute distress noted.  -No complaints of shortness of breath.  -No complaints of chest pain.     Blood pressure 144/94, pulse 91, temperature 97.5 F (36.4 C), temperature source Oral, resp. rate 22, height 6\' 1"  (1.854 m), weight 67.132 kg (148 lb), SpO2 93.00%.   IV Fluids:  IV in place, occlusive dsg intact without redness, IV cath forearm left, condition patent normal saline.   Allergies:  Review of patient's allergies indicates no known allergies.  Past Medical History:   has no past medical history on file.  Past Surgical History:   has no past surgical history on file.  Social History:   reports that he has been smoking Cigarettes.  He has been smoking about 1.00 pack per day. He does not have any smokeless tobacco history on file. He reports that he drinks alcohol. He reports that he does not use illicit drugs.  Skin: Intact  Patient/Family orientated to room. Information packet given to patient/family. Admission inpatient armband information verified with patient/family to include name and date of birth and placed on patient arm. Side rails up x 2, fall assessment and education completed with patient/family.  Call light within reach.  Will continue to evaluate and treat per MD orders.

## 2013-09-07 NOTE — Progress Notes (Signed)
[place] TEAM 1 - Stepdown/ICU TEAM Progress Note  Alfred [name] RUE:[id number] DOB: 1950/09/24 DOA: 09/04/2013 PCP: No PCP Per Patient  Admit HPI / Brief Narrative: 63yo male with known Etoh abuse and no Etoh in 24 hours prior to presentation per ED report (pt not making any sense and family not available) who presented w/ worsening delerium and tremors. He was referring to horses, guns, etc.   In ED his VSS and labs were unremarkable. He was admitted for tx of Etoh withdrawal.   HPI/Subjective: The patient remains confused but does appear to be slowly improving.  Today he can at least tell me he is in the hospital, though he thinks he is [facility].  He does not appear uncomfortable.  He complains simply of "hurting all over."    Assessment/Plan:  Acute alcohol withdrawal Cont w/ CIWA protocol - appears to be stabilized enough for transfer to medical bed - check B12 for the sake of completeness - continue supplemental thiamine and folate  Mild hyponatremia Sodium level is fluctuating but is not dangerously abnormal - this is likely due to his chronic alcohol abuse - follow trend intermittently  Mild thrombocytopenia Plts are improving in the absence of alcohol  EtOH abuse/dependence  coags are normal - no evidence of severe liver dysfxn at present - patient is not yet alert enough to counsel on need for abstinence, nonetheless I continue to do so daily - we will need to stress this point when/if his mental status clears further  Dental abscesses  On empiric abx coverage - no evidence of complicating factors at present  Code Status: FULL Family Communication: no family present at time of exam Disposition Plan: Transfer to medical bed  Consultants: none  Procedures: none  Antibiotics: Clindamycin 4/3 >>  DVT prophylaxis: lovenox  Objective: Blood pressure 128/85, pulse 82, temperature 98.4 F (36.9 C), temperature source Oral, resp. rate 18, height 6\' 3"   (1.905 m), weight 65.3 kg (143 lb 15.4 oz), SpO2 98.00%.  Intake/Output Summary (Last 24 hours) at 09/07/13 0942 Last data filed at 09/07/13 0900  Gross per 24 hour  Intake   2830 ml  Output   1625 ml  Net   1205 ml   Exam: General: No acute respiratory distress - alert but confused Lungs: Clear to auscultation bilaterally without wheezes or crackles Cardiovascular: Regular rate and rhythm without murmur gallop or rub  Abdomen: Nontender, nondistended, soft, bowel sounds positive, no rebound, no ascites, no appreciable mass Extremities: No significant cyanosis, clubbing, or edema bilateral lower extremities  Data Reviewed: Basic Metabolic Panel:  Recent Labs Lab 09/04/13 0840 09/04/13 2221 09/05/13 0541 09/06/13 0307 09/07/13 0244  NA 136* 134* 133* 135* 132*  K 4.2 4.4 3.8 3.7 3.7  CL 94* 95* 98 97 94*  CO2 22 20 19 20 20   GLUCOSE 81 62* 56* 79 113*  BUN 10 11 10 7 8   CREATININE 0.89 0.90 0.92 0.84 0.87  CALCIUM 9.0 9.0 8.0* 8.2* 8.2*   Liver Function Tests:  Recent Labs Lab 09/04/13 0843 09/05/13 0541 09/06/13 0307 09/07/13 0244  AST 93* 100* 99* 78*  ALT 51 43 48 49  ALKPHOS 102 74 72 73  BILITOT 2.3* 1.7* 1.3* 1.2  PROT 7.5 6.1 5.9* 6.0  ALBUMIN 3.5 2.9* 2.7* 2.8*    Recent Labs Lab 09/04/13 0843  LIPASE 18    Recent Labs Lab 09/06/13 0307  AMMONIA 30   CBC:  Recent Labs Lab 09/04/13 0840 09/04/13 2221  09/05/13 0541 09/06/13 0307 09/07/13 0244  WBC 6.5 6.8 5.0 4.2 6.4  NEUTROABS  --  5.5  --   --   --   HGB 15.0 14.1 13.1 12.6* 13.1  HCT 42.6 39.8 38.0* 36.2* 36.9*  MCV 90.3 90.0 91.1 89.6 88.7  PLT 139* 120* 104* 123* 129*   CBG:  Recent Labs Lab 09/04/13 2119  GLUCAP 73    Recent Results (from the past 240 hour(s))  MRSA PCR SCREENING     Status: None   Collection Time    09/05/13  2:38 AM      Result Value Ref Range Status   MRSA by PCR NEGATIVE  NEGATIVE Final   Comment:            The GeneXpert MRSA Assay (FDA      approved for NASAL specimens     only), is one component of a     comprehensive MRSA colonization     surveillance program. It is not     intended to diagnose MRSA     infection nor to guide or     monitor treatment for     MRSA infections.     Studies:  Recent x-ray studies have been reviewed in detail by the Attending Physician  Scheduled Meds:  Scheduled Meds: . aspirin EC  81 mg Oral Daily  . clindamycin (CLEOCIN) IV  600 mg Intravenous 3 times per day  . enoxaparin (LOVENOX) injection  40 mg Subcutaneous Q24H  . feeding supplement (ENSURE COMPLETE)  237 mL Oral BID BM  . folic acid  1 mg Oral Daily  . thiamine  100 mg Oral Daily    Time spent on care of this patient: 35 mins   [facility] T  [facility] Office  [phone] Pager - Text Page per Loretha Stapler as per below:  On-Call/Text Page:      [email or web address]      password [email or web address]  If 7PM-7AM, please contact night-coverage [email or web address] Password [email or web address] 09/07/2013, 9:42 AM   LOS: 3 days

## 2013-09-08 ENCOUNTER — Inpatient Hospital Stay (HOSPITAL_COMMUNITY): Payer: Medicaid Other

## 2013-09-08 DIAGNOSIS — E43 Unspecified severe protein-calorie malnutrition: Secondary | ICD-10-CM

## 2013-09-08 LAB — BASIC METABOLIC PANEL
BUN: 10 mg/dL (ref 6–23)
CALCIUM: 8.4 mg/dL (ref 8.4–10.5)
CO2: 22 mEq/L (ref 19–32)
CREATININE: 0.84 mg/dL (ref 0.50–1.35)
Chloride: 97 mEq/L (ref 96–112)
GFR calc Af Amer: >90 mL/min (ref >90)
GLUCOSE: 80 mg/dL (ref 70–99)
Potassium: 4 mEq/L (ref 3.7–5.3)
Sodium: 134 mEq/L — ABNORMAL LOW (ref 137–147)

## 2013-09-08 LAB — VITAMIN B12: Vitamin B-12: 832 pg/mL (ref 211–911)

## 2013-09-08 MED ORDER — IBUPROFEN 600 MG PO TABS
600.0000 mg | ORAL_TABLET | Freq: Three times a day (TID) | ORAL | Status: DC | PRN
  Administered 2013-09-08 – 2013-09-09 (×2): 600 mg via ORAL
  Filled 2013-09-08 (×2): qty 1

## 2013-09-08 NOTE — Progress Notes (Signed)
Progress Note  Alfred [name] NGE:[id number] DOB: 1951-01-21 DOA: 09/04/2013 PCP: No PCP Per Patient  Admit HPI / Brief Narrative: 63yo male with known Etoh abuse and no Etoh in 24 hours prior to presentation per ED report (pt not making any sense and family not available) who presented w/ worsening delerium and tremors. He was referring to horses, guns, etc.  In ED his VSS and labs were unremarkable. He was admitted for tx of Etoh withdrawal.   HPI/Subjective: Improving, still slightly confused. complaining about left knee pain.   Assessment/Plan:  Acute alcohol withdrawal -On CIWA protocol, transferred yesterday from stepdown. -Normal B12 level, off of restraints today.  Abdominal pain -Likely patient has alcoholic hepatitis. -AST/ALT ratio consistent with alcoholic hepatitis, patient abdominal pain is improving. -Currently tolerating diet without difficulties.  Mild hyponatremia -Sodium level is fluctuating but is not dangerously abnormal - this is likely due to his chronic alcohol abuse - follow trend intermittently  Mild thrombocytopenia -Plts are improving in the absence of alcohol  EtOH abuse/dependence  -coags are normal - no evidence of severe liver dysfxn at present - patient is not yet alert enough to counsel on need for abstinence, nonetheless I continue to do so daily - we will need to stress this point when/if his mental status clears further  Dental abscesses  -On empiric abx coverage - no evidence of complicating factors at present.  Left knee pain -Left knee pain with swelling, recent history of falls. -CT scan of the left knee.  Code Status: FULL Family Communication: no family present at time of exam Disposition Plan: Transfer to medical bed  Consultants: none  Procedures: none  Antibiotics: Clindamycin 4/3 >>  DVT prophylaxis: lovenox  Objective: Blood pressure 131/84, pulse 87, temperature 97.4 F (36.3 C), temperature source Oral, resp.  rate 20, height 6\' 1"  (1.854 m), weight 67.132 kg (148 lb), SpO2 96.00%.  Intake/Output Summary (Last 24 hours) at 09/08/13 1525 Last data filed at 09/08/13 1357  Gross per 24 hour  Intake     50 ml  Output    300 ml  Net   -250 ml   Exam: General: No acute respiratory distress - alert but confused Lungs: Clear to auscultation bilaterally without wheezes or crackles Cardiovascular: Regular rate and rhythm without murmur gallop or rub  Abdomen: Nontender, nondistended, soft, bowel sounds positive, no rebound, no ascites, no appreciable mass Extremities: No significant cyanosis, clubbing, or edema bilateral lower extremities  Data Reviewed: Basic Metabolic Panel:  Recent Labs Lab 09/04/13 2221 09/05/13 0541 09/06/13 0307 09/07/13 0244 09/08/13 0715  NA 134* 133* 135* 132* 134*  K 4.4 3.8 3.7 3.7 4.0  CL 95* 98 97 94* 97  CO2 20 19 20 20 22   GLUCOSE 62* 56* 79 113* 80  BUN 11 10 7 8 10   CREATININE 0.90 0.92 0.84 0.87 0.84  CALCIUM 9.0 8.0* 8.2* 8.2* 8.4   Liver Function Tests:  Recent Labs Lab 09/04/13 0843 09/05/13 0541 09/06/13 0307 09/07/13 0244  AST 93* 100* 99* 78*  ALT 51 43 48 49  ALKPHOS 102 74 72 73  BILITOT 2.3* 1.7* 1.3* 1.2  PROT 7.5 6.1 5.9* 6.0  ALBUMIN 3.5 2.9* 2.7* 2.8*    Recent Labs Lab 09/04/13 0843  LIPASE 18    Recent Labs Lab 09/06/13 0307  AMMONIA 30   CBC:  Recent Labs Lab 09/04/13 0840 09/04/13 2221 09/05/13 0541 09/06/13 0307 09/07/13 0244  WBC 6.5 6.8 5.0 4.2 6.4  NEUTROABS  --  5.5  --   --   --   HGB 15.0 14.1 13.1 12.6* 13.1  HCT 42.6 39.8 38.0* 36.2* 36.9*  MCV 90.3 90.0 91.1 89.6 88.7  PLT 139* 120* 104* 123* 129*   CBG:  Recent Labs Lab 09/04/13 2119  GLUCAP 73    Recent Results (from the past 240 hour(s))  MRSA PCR SCREENING     Status: None   Collection Time    09/05/13  2:38 AM      Result Value Ref Range Status   MRSA by PCR NEGATIVE  NEGATIVE Final   Comment:            The GeneXpert MRSA  Assay (FDA     approved for NASAL specimens     only), is one component of a     comprehensive MRSA colonization     surveillance program. It is not     intended to diagnose MRSA     infection nor to guide or     monitor treatment for     MRSA infections.     Studies:  Recent x-ray studies have been reviewed in detail by the Attending Physician  Scheduled Meds:  Scheduled Meds: . aspirin EC  81 mg Oral Daily  . clindamycin  300 mg Oral 3 times per day  . enoxaparin (LOVENOX) injection  40 mg Subcutaneous Q24H  . feeding supplement (ENSURE COMPLETE)  237 mL Oral BID BM  . folic acid  1 mg Oral Daily  . thiamine  100 mg Oral Daily    Time spent on care of this patient: 35 mins   [facility] LLCELMAHI,Orlanda Lemmerman A  [facility] Office  [phone] Pager - Text Page per Loretha StaplerAmion as per below:  On-Call/Text Page:      [email or web address]      password [email or web address]  If 7PM-7AM, please contact night-coverage [email or web address] Password [email or web address] 09/08/2013, 3:25 PM   LOS: 4 days

## 2013-09-09 LAB — COMPREHENSIVE METABOLIC PANEL
ALK PHOS: 57 U/L (ref 39–117)
ALT: 42 U/L (ref 0–53)
AST: 54 U/L — AB (ref 0–37)
Albumin: 2.4 g/dL — ABNORMAL LOW (ref 3.5–5.2)
BILIRUBIN TOTAL: 1.2 mg/dL (ref 0.3–1.2)
BUN: 10 mg/dL (ref 6–23)
CHLORIDE: 100 meq/L (ref 96–112)
CO2: 20 meq/L (ref 19–32)
Calcium: 8.3 mg/dL — ABNORMAL LOW (ref 8.4–10.5)
Creatinine, Ser: 0.79 mg/dL (ref 0.50–1.35)
GFR calc Af Amer: >90 mL/min (ref >90)
Glucose, Bld: 83 mg/dL (ref 70–99)
POTASSIUM: 4 meq/L (ref 3.7–5.3)
Sodium: 134 mEq/L — ABNORMAL LOW (ref 137–147)
Total Protein: 5.8 g/dL — ABNORMAL LOW (ref 6.0–8.3)

## 2013-09-09 LAB — CBC
HEMATOCRIT: 36 % — AB (ref 39.0–52.0)
Hemoglobin: 12.7 g/dL — ABNORMAL LOW (ref 13.0–17.0)
MCH: 31.7 pg (ref 26.0–34.0)
MCHC: 35.3 g/dL (ref 30.0–36.0)
MCV: 89.8 fL (ref 78.0–100.0)
Platelets: 166 10*3/uL (ref 150–400)
RBC: 4.01 MIL/uL — AB (ref 4.22–5.81)
RDW: 14.4 % (ref 11.5–15.5)
WBC: 5 10*3/uL (ref 4.0–10.5)

## 2013-09-09 MED ORDER — LIDOCAINE-EPINEPHRINE 1 %-1:100000 IJ SOLN
10.0000 mL | Freq: Once | INTRAMUSCULAR | Status: DC
  Filled 2013-09-09: qty 10

## 2013-09-09 MED ORDER — METHYLPREDNISOLONE ACETATE 40 MG/ML IJ SUSP
80.0000 mg | Freq: Once | INTRAMUSCULAR | Status: AC
  Administered 2013-09-09: 80 mg via INTRA_ARTICULAR
  Filled 2013-09-09: qty 2

## 2013-09-09 NOTE — Consult Note (Signed)
Reason for Consult: Left knee pan and swelling, "loose body identified on CT scan" Referring Physician:  Arthor CaptainElmahi, MD  Alfred [name] is an 63 y.o. male.  HPI: 63 yo male with known arthritis admitted for other reasons but reports difficulty getting around due to his knee pain/stiffness.  [facility]. Reports previous 3 scopes on left knee, recent cortisone injections with some relief No recent fever chills or night sweats  History reviewed. No pertinent past medical history.  History reviewed. No pertinent past surgical history.  History reviewed. No pertinent family history.  Social History:  reports that he has been smoking Cigarettes.  He has been smoking about 1.00 pack per day. He does not have any smokeless tobacco history on file. He reports that he drinks alcohol. He reports that he does not use illicit drugs.  Allergies: No Known Allergies  Medications:  I have reviewed the patient's current medications. Scheduled: . aspirin EC  81 mg Oral Daily  . clindamycin  300 mg Oral 3 times per day  . enoxaparin (LOVENOX) injection  40 mg Subcutaneous Q24H  . feeding supplement (ENSURE COMPLETE)  237 mL Oral BID BM  . folic acid  1 mg Oral Daily  . lidocaine-EPINEPHrine  10 mL Other Once  . thiamine  100 mg Oral Daily    Results for orders placed during the hospital encounter of 09/04/13 (from the past 24 hour(s))  CBC     Status: Abnormal   Collection Time    09/09/13  6:45 AM      Result Value Ref Range   WBC 5.0  4.0 - 10.5 K/uL   RBC 4.01 (*) 4.22 - 5.81 MIL/uL   Hemoglobin 12.7 (*) 13.0 - 17.0 g/dL   HCT 16.136.0 (*) 09.639.0 - 04.552.0 %   MCV 89.8  78.0 - 100.0 fL   MCH 31.7  26.0 - 34.0 pg   MCHC 35.3  30.0 - 36.0 g/dL   RDW 40.914.4  81.111.5 - 91.415.5 %   Platelets 166  150 - 400 K/uL  COMPREHENSIVE METABOLIC PANEL     Status: Abnormal   Collection Time    09/09/13  6:45 AM      Result Value  Ref Range   Sodium 134 (*) 137 - 147 mEq/L   Potassium 4.0  3.7 - 5.3 mEq/L   Chloride 100  96 - 112 mEq/L   CO2 20  19 - 32 mEq/L   Glucose, Bld 83  70 - 99 mg/dL   BUN 10  6 - 23 mg/dL   Creatinine, Ser 7.820.79  0.50 - 1.35 mg/dL   Calcium 8.3 (*) 8.4 - 10.5 mg/dL   Total Protein 5.8 (*) 6.0 - 8.3 g/dL   Albumin 2.4 (*) 3.5 - 5.2 g/dL   AST 54 (*) 0 - 37 U/L   ALT 42  0 - 53 U/L   Alkaline Phosphatase 57  39 - 117 U/L   Total Bilirubin 1.2  0.3 - 1.2 mg/dL   GFR calc non Af Amer >90  >90 mL/min   GFR calc Af Amer >90  >90 mL/min    X-ray: CT scan left knee: EXAM:  CT OF THE LEFT KNEE WITHOUT CONTRAST  TECHNIQUE:  Multidetector CT imaging was performed according to the standard  protocol. Multiplanar CT image reconstructions were also generated.  COMPARISON: None.  FINDINGS:  There is moderate  decreased bone mineralization. Examination  demonstrates a large joint effusion. There is moderate  tricompartmental osteoarthritic change worse over the lateral  compartment. There is no acute fracture or dislocation. There is a 1  cm loose body over the anterior midline of the knee joint. Remainder  the exam is unremarkable.  IMPRESSION:  No acute fracture dislocation. Large joint effusion.  Moderate tricompartmental osteoarthritis. 1 cm loose body over the  anterior midline of the knee joint.   ROS Per medical admission Pertinent no recent fever chills Reports recent issues with gait due to knee pain and stiffness  Blood pressure 139/86, pulse 75, temperature 98.4 F (36.9 C), temperature source Oral, resp. rate 18, height 6\' 1"  (1.854 m), weight 67.132 kg (148 lb), SpO2 95.00%.  Physical Exam Thin male in no acute distress Left knee, large effusion with some warmth Valgus left knee Stiff with ROM  Assessment/Plan: Left knee advanced OA and pain associated with large knee effusion  Aspirated left knee tonight removing 200cc of clear non-inflammatory based joint  fluid Then injected his knee with 8-mg of depomedrol and 5cc of lidocaine.  He tolerated it well Orthopaedic follow up as needed  Ice and NSAIDs would help to moderate symptoms  Kire Ferg D 09/09/2013, 9:00 PM
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
## 2013-09-09 NOTE — Clinical Social Work Placement (Signed)
Clinical Social Work Department BRIEF PSYCHOSOCIAL ASSESSMENT 09/09/2013  Patient:  Alfred [name], Alfred [name]     Account Number:  [id number]     Admit date:  09/04/2013  Clinical Social Worker:  Lovey Newcomer  Date/Time:  09/09/2013 03:00 PM  Referred by:  Physician  Date Referred:  09/09/2013 Referred for  SNF Placement  Substance Abuse   Other Referral:   Interview type:  Patient Other interview type:   Patient alert and oriented at time of assessment.    PSYCHOSOCIAL DATA Living Status:  FRIEND(S) Admitted from facility:   Level of care:   Primary support name:  Earlene Primary support relationship to patient:  CHILD, ADULT Degree of support available:   Support is fair.    CURRENT CONCERNS Current Concerns  Post-Acute Placement   Other Concerns:    SOCIAL WORK ASSESSMENT / PLAN CSW met with patient at bedside and explained that PT has recommended SNF for patient. Patient states that he is agreeable to SNF placement at discharge and understands that his options will be limited due to [facility] insurance. Patient states that he is from home with his "friend" and his friend's two kids. Patient states that his daughter Lenice Llamas is his main support. Patient does not report any previous SNF experience. CSW explained that patient would need to be agreeable to being at SNF for at least 30 days or his Medicaid would not pay the facility for his stay. Patient verbalized understanding and is agreeable to this.    CSW inquired about patient's ETOH abuse. Patient states that he has been drinking consistently for about 15 years. Patient currently drinks 3 40oz beers a day. CSW explained to patient this is actually 10 alcoholic beverages and not the "3" he self reported. Patient understands that this is an excessive amount of alcohol to drink each day. Patient has a history of polysubstance abuse which includes crack and marijuana. Patient states that he has not smoke crack in the  last two years. CSW completed SBIRT with patient and provided AA meeting schedule and list of treatment facilities to patient. Patient was appreciatie of resources.   Assessment/plan status:  Psychosocial Support/Ongoing Assessment of Needs Other assessment/ plan:   Complete Fl2, Fax, Pasrr   Information/referral to community resources:   CSW contact information, SNF list, and SA resources provided to patient.    PATIENT'S/FAMILY'S RESPONSE TO PLAN OF CARE: Patient is agreeable to SNF placement at discharge and understands that the facility would expect him to stay at the facility for at least 30 days. Patient seems indifferent about SNF placement and but does seem optimistic that he can lessen or discontinue his alcohol use. CSW will follow up with bed offers.      Liz Beach MSW, [place], [place], [phone]
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
## 2013-09-09 NOTE — Progress Notes (Signed)
Progress Note  Alfred [name] WUJ:[id number] DOB: [birth year] DOA: 09/04/2013 PCP: No PCP Per Patient  Admit HPI / Brief Narrative: 63yo male with known Etoh abuse and no Etoh in 24 hours prior to presentation per ED report (pt not making any sense and family not available) who presented w/ worsening delerium and tremors. He was referring to horses, guns, etc.  In ED his VSS and labs were unremarkable. He was admitted for tx of Etoh withdrawal.   HPI/Subjective: Feels better 09/09/2013, complaining of worsening Left Knee pain.  Was having trouble with Physical Therapy. Unable to stand.  Assessment/Plan:  Acute alcohol withdrawal -On CIWA protocol ,  Mental status improved today,  Tremors have decreased since 09/08/2013  -Normal B12 level, off of restraints 09/08/2013. CIWA score 09/06/2013.   Abdominal pain -Likely patient has alcoholic hepatitis. -AST/ALT ratio consistent with alcoholic hepatitis, patient abdominal pain is improving no complaints on 09/09/2013. -Currently tolerating diet without difficulties.  Mild hyponatremia -Sodium level is fluctuating,  this is likely due to his chronic alcohol abuse - follow trend intermittently  Mild thrombocytopenia -Platelets returned to acceptable levels -166 on 09/09/2013  EtOH abuse/dependence  -Coags are normal - no evidence of severe liver dysfxn at present - Patient appears alert today and will educate patient on alcohol abstinence,   Dental abscesses  -On empiric abx coverage - no evidence of complicating factors at present.  Left knee pain -Left knee pain with swelling, recent history of falls. -CT scan indicated effusion, osteoarthritis, and a 1 cm floating body,  Will consult orthopedic for further treatment options.  Code Status: FULL Family Communication: no family present at time of exam Disposition Plan: Transfer to medical bed  Consultants: Orthopedics   Procedures:   Antibiotics: Clindamycin 4/3 >>  DVT  prophylaxis: lovenox  Objective: Blood pressure 136/88, pulse 75, temperature 97.6 F (36.4 C), temperature source Oral, resp. rate 18, height 6\' 1"  (1.854 m), weight 67.132 kg (148 lb), SpO2 95.00%.  Intake/Output Summary (Last 24 hours) at 09/09/13 1043 Last data filed at 09/09/13 0900  Gross per 24 hour  Intake    360 ml  Output   1450 ml  Net  -1090 ml   CIWA 4/42015 - 3 -very  Mild withdraw  Exam: General: No acute respiratory distress - alert , appears uncomfortable Lungs: Clear to auscultation bilaterally without wheezes or crackles Cardiovascular: Regular rate and rhythm without murmur gallop or rub  Abdomen: Nontender, nondistended, soft, bowel sounds positive, no rebound, no ascites, no appreciable mass Extremities: Left knee,  Mild effusion, no erythema, no heat, pain with movement. No significant cyanosis, clubbing, or edema bilateral lower extremities Psych:  Alert x3,     Data Reviewed: Basic Metabolic Panel:  Recent Labs Lab 09/05/13 0541 09/06/13 0307 09/07/13 0244 09/08/13 0715 09/09/13 0645  NA 133* 135* 132* 134* 134*  K 3.8 3.7 3.7 4.0 4.0  CL 98 97 94* 97 100  CO2 19 20 20 22 20   GLUCOSE 56* 79 113* 80 83  BUN 10 7 8 10 10   CREATININE 0.92 0.84 0.87 0.84 0.79  CALCIUM 8.0* 8.2* 8.2* 8.4 8.3*   Liver Function Tests:  Recent Labs Lab 09/04/13 0843 09/05/13 0541 09/06/13 0307 09/07/13 0244 09/09/13 0645  AST 93* 100* 99* 78* 54*  ALT 51 43 48 49 42  ALKPHOS 102 74 72 73 57  BILITOT 2.3* 1.7* 1.3* 1.2 1.2  PROT 7.5 6.1 5.9* 6.0 5.8*  ALBUMIN 3.5 2.9* 2.7* 2.8* 2.4*  Recent Labs Lab 09/04/13 0843  LIPASE 18    Recent Labs Lab 09/06/13 0307  AMMONIA 30   CBC:  Recent Labs Lab 09/04/13 2221 09/05/13 0541 09/06/13 0307 09/07/13 0244 09/09/13 0645  WBC 6.8 5.0 4.2 6.4 5.0  NEUTROABS 5.5  --   --   --   --   HGB 14.1 13.1 12.6* 13.1 12.7*  HCT 39.8 38.0* 36.2* 36.9* 36.0*  MCV 90.0 91.1 89.6 88.7 89.8  PLT 120* 104*  123* 129* 166   CBG:  Recent Labs Lab 09/04/13 2119  GLUCAP 73    Recent Results (from the past 240 hour(s))  MRSA PCR SCREENING     Status: None   Collection Time    09/05/13  2:38 AM      Result Value Ref Range Status   MRSA by PCR NEGATIVE  NEGATIVE Final   Comment:            The GeneXpert MRSA Assay (FDA     approved for NASAL specimens     only), is one component of a     comprehensive MRSA colonization     surveillance program. It is not     intended to diagnose MRSA     infection nor to guide or     monitor treatment for     MRSA infections.     Studies: Ct Knee Left Wo Contrast  09/09/2013   CLINICAL DATA:  Left knee pain and swelling post fall.  EXAM: CT OF THE LEFT KNEE WITHOUT CONTRAST  TECHNIQUE: Multidetector CT imaging was performed according to the standard protocol. Multiplanar CT image reconstructions were also generated.  COMPARISON:  None.  FINDINGS: There is moderate decreased bone mineralization. Examination demonstrates a large joint effusion. There is moderate tricompartmental osteoarthritic change worse over the lateral compartment. There is no acute fracture or dislocation. There is a 1 cm loose body over the anterior midline of the knee joint. Remainder the exam is unremarkable.  IMPRESSION: No acute fracture dislocation.  Large joint effusion.  Moderate tricompartmental osteoarthritis. 1 cm loose body over the anterior midline of the knee joint.   Electronically Signed   By: Elberta Fortisaniel  Boyle M.D.   On: 09/09/2013 02:28    Scheduled Meds:  Scheduled Meds: . aspirin EC  81 mg Oral Daily  . clindamycin  300 mg Oral 3 times per day  . enoxaparin (LOVENOX) injection  40 mg Subcutaneous Q24H  . feeding supplement (ENSURE COMPLETE)  237 mL Oral BID BM  . folic acid  1 mg Oral Daily  . thiamine  100 mg Oral Daily    Time spent on care of this patient: 35 mins   Lisabeth DevoidBenda, Gary Alan PA-S  [facility] Office  [phone] Pager - Text Page per Loretha StaplerAmion  as per below:  On-Call/Text Page:      [email or web address]      password [email or web address]  If 7PM-7AM, please contact night-coverage [email or web address] Password [email or web address] 09/09/2013, 10:43 AM   LOS: 5 days     Addendum  Patient seen and examined, chart and data base reviewed.  I agree with the above assessment and plan.  For full details please see Mrs. Basilia JumboBenda, Gary Alan PA-S note.  The above note is reviewed and amended by me.   Clint LippsMutaz A Ofelia Podolski, MD [facility] Pager: [phone] 09/09/2013, 12:07 PM

## 2013-09-10 DIAGNOSIS — F10931 Alcohol use, unspecified with withdrawal delirium: Secondary | ICD-10-CM | POA: Diagnosis present

## 2013-09-10 DIAGNOSIS — F1010 Alcohol abuse, uncomplicated: Secondary | ICD-10-CM

## 2013-09-10 DIAGNOSIS — F10231 Alcohol dependence with withdrawal delirium: Secondary | ICD-10-CM | POA: Diagnosis present

## 2013-09-10 DIAGNOSIS — M25462 Effusion, left knee: Secondary | ICD-10-CM

## 2013-09-10 DIAGNOSIS — K7010 Alcoholic hepatitis without ascites: Secondary | ICD-10-CM | POA: Diagnosis present

## 2013-09-10 DIAGNOSIS — K047 Periapical abscess without sinus: Secondary | ICD-10-CM

## 2013-09-10 DIAGNOSIS — R4182 Altered mental status, unspecified: Secondary | ICD-10-CM

## 2013-09-10 DIAGNOSIS — E440 Moderate protein-calorie malnutrition: Secondary | ICD-10-CM | POA: Diagnosis present

## 2013-09-10 HISTORY — DX: Periapical abscess without sinus: K04.7

## 2013-09-10 HISTORY — DX: Alcohol use, unspecified with withdrawal delirium: F10.931

## 2013-09-10 HISTORY — DX: Alcohol dependence with withdrawal delirium: F10.231

## 2013-09-10 HISTORY — DX: Altered mental status, unspecified: R41.82

## 2013-09-10 HISTORY — DX: Effusion, left knee: M25.462

## 2013-09-10 MED ORDER — CLINDAMYCIN HCL 300 MG PO CAPS: 300.0000 mg | ORAL_CAPSULE | Freq: Three times a day (TID) | ORAL | Status: AC

## 2013-09-10 MED ORDER — THIAMINE HCL 100 MG PO TABS: 100.0000 mg | ORAL_TABLET | Freq: Every day | ORAL | Status: AC

## 2013-09-10 MED ORDER — IBUPROFEN 600 MG PO TABS: 600.0000 mg | ORAL_TABLET | Freq: Three times a day (TID) | ORAL | Status: DC | PRN

## 2013-09-10 NOTE — Discharge Summary (Deleted)
Physician Discharge Summary  Alfred [name] ZOX:[id number] DOB: 06-30-1950 DOA: 09/04/2013  PCP: No PCP Per Patient  Admit date: 09/04/2013 Discharge date: 09/10/2013  Time spent: 35 minutes  Recommendations for Outpatient Follow-up:  1. Follow up about alcohol and substance abuse.  Jomarie Longs provided a list of of treatment faciliteis and a list of local AA meetings.  2. Patient will be going to a SNF.  Will need to follow up with the MD there  Discharge Diagnoses:  Active Problems:   DTs (delirium tremens)   Protein-calorie malnutrition, severe   Discharge Condition: stable  Diet recommendation: normal diet, no alcohol  Filed Weights   09/05/13 0200 09/07/13 1819  Weight: 65.3 kg (143 lb 15.4 oz) 67.132 kg (148 lb)    History of present illness:  63yo male with known Etoh abuse and no Etoh in 24 hours prior to presentation per ED report (pt not making any sense and family not available) who presented w/ worsening delerium and tremors. He was referring to horses, guns, etc.  In ED his VSS and labs were unremarkable. He was admitted for tx of Etoh withdrawal.   Hospital Course:  Acute alcohol withdrawal  -On CIWA protocol , Mental status improved today, Tremors have decreased since 09/08/2013  -Normal B12 level, off of restraints 09/08/2013. CIWA score 09/06/2013.  -09/10/13 - tremors scored on 1 today for CIWA, No agitation, No anxiety.  No physical symptons of withdraw are present at disharge Abdominal pain  Patient likely patient has alcoholic hepatitis.  -AST/ALT ratio consistent with alcoholic hepatitis, patient abdominal pain is improving no complaints on 09/09/2013.  -Currently tolerating diet without difficulties.  -09/10/13 No new complaints-Patient eating breakfast  -resolved Mild hyponatremia  -Sodium level is fluctuating, this is likely due to his chronic alcohol abuse will improve with alcohol avoidance and proper diet Mild thrombocytopenia  -Platelets low on 09/05/13 at 104,    09/06/13 platelet count 123  returned to acceptable levels -166 on 09/09/2013 -resolved EtOH abuse/dependence  -Coags are normal - no evidence of severe liver dysfunction at hospital stay - Patient appears alert 09/10/13 and patient was educated by Evlyn Courier on 09/09/13 patient on alcohol abstinence,  Patient plans on entering a SNF.  Dental abscesses  -On empiric abx coverage (clindamycin) - no evidence of complicating factors at present. Will continue antibiotics once disharged  Left knee pain  -Left knee pain with swelling, recent history of falls.  -CT scan indicated effusion, osteoarthritis, and a 1 cm floating body, consulted orthopedic for further treatment options.  -Orthopedics evaluated left knee on 09/09/13 and aspirated 200cc of fluid and then injected knee with 8mg  depomedrol and 5 cc of lidocaine. Patient report much improvement of pain and mobility.  Orthopedics recommend follow up if need.   Procedures:  09/09/13 - Dr. Charlann Boxer, Lysle Morales aspirated left knee tonight removing 200cc of clear non-inflammatory based joint fluid  Then injected his knee with 8-mg of depomedrol and 5cc of lidocaine. He tolerated it well.   Consultations:  Follow up with orthopedic as need  Discharge Exam: Filed Vitals:   09/10/13 0455  BP: 121/80  Pulse: 74  Temp: 98.6 F (37 C)  Resp: 18    General: No acute respiratory distress - alert , appears uncomfortable  Lungs: Clear to auscultation bilaterally without wheezes or crackles  Cardiovascular: Regular rate and rhythm without murmur gallop or rub  Abdomen: Nontender, nondistended, soft, bowel sounds positive, no rebound, no ascites, no appreciable mass  Extremities: Left knee, Minimal  effusion, no erythema, no heat,no pain with movement, no crepitus. No significant cyanosis, clubbing, or edema bilateral lower extremities. Psych: Alert x3,    Discharge Instructions You were cared for by a hospitalist during your hospital stay. If you have any  questions about your discharge medications or the care you received while you were in the hospital after you are discharged, you can call the unit and asked to speak with the hospitalist on call if the hospitalist that took care of you is not available. Once you are discharged, your primary care physician will handle any further medical issues. Please note that NO REFILLS for any discharge medications will be authorized once you are discharged, as it is imperative that you return to your primary care physician (or establish a relationship with a primary care physician if you do not have one) for your aftercare needs so that they can reassess your need for medications and monitor your lab values.     Medication List    ASK your doctor about these medications       aspirin EC 81 MG tablet  Take 81 mg by mouth daily.       No Known Allergies    The results of significant diagnostics from this hospitalization (including imaging, microbiology, ancillary and laboratory) are listed below for reference.    Significant Diagnostic Studies: Dg Chest 2 View  09/04/2013   CLINICAL DATA:  Status post fall; altered mental status.  EXAM: CHEST  2 VIEW  COMPARISON:  Chest radiograph performed earlier today at 9:05 a.m.  FINDINGS: The lungs are well-aerated. Minimal left-sided atelectasis is noted. There is no evidence of pleural effusion or pneumothorax.  The heart is normal in size; the mediastinal contour is within normal limits. No acute osseous abnormalities are seen.  IMPRESSION: Minimal left-sided atelectasis noted; lungs otherwise clear.   Electronically Signed   By: Roanna RaiderJeffery  Chang M.D.   On: 09/04/2013 22:12   Dg Chest 2 View  09/04/2013   CLINICAL DATA:  Chest pain.  Shortness of breath.  EXAM: CHEST  2 VIEW  COMPARISON:  05/20/2013  FINDINGS: Heart size is normal. Mediastinal shadows are normal. The lungs are hyperinflated suggesting emphysema. No evidence of infiltrate, mass, effusion or collapse.  Bony density related to the distal first ribs again noted.  IMPRESSION: No active cardiopulmonary disease.  Probable emphysema.   Electronically Signed   By: Paulina FusiMark  Shogry M.D.   On: 09/04/2013 09:18   Ct Head Wo Contrast  09/04/2013   CLINICAL DATA:  Status post fall; bilateral temporal headache, nasal pain and laceration at the chin. Concern for neck injury.  EXAM: CT HEAD WITHOUT CONTRAST  CT MAXILLOFACIAL WITHOUT CONTRAST  CT CERVICAL SPINE WITHOUT CONTRAST  TECHNIQUE: Multidetector CT imaging of the head, cervical spine, and maxillofacial structures were performed using the standard protocol without intravenous contrast. Multiplanar CT image reconstructions of the cervical spine and maxillofacial structures were also generated.  COMPARISON:  None.  FINDINGS: CT HEAD FINDINGS  There is no evidence of acute infarction, mass lesion, or intra- or extra-axial hemorrhage on CT.  Scattered periventricular and subcortical white matter change likely reflects small vessel ischemic microangiopathy. Mild increased density along the falx cerebri is thought to reflect calcification; no definite hemorrhage is seen.  The posterior fossa, including the cerebellum, brainstem and fourth ventricle, is within normal limits. The third and lateral ventricles, and basal ganglia are unremarkable in appearance. The cerebral hemispheres are symmetric in appearance, with normal gray-white differentiation. No  mass effect or midline shift is seen.  There is no evidence of fracture; visualized osseous structures are unremarkable in appearance. The orbits are within normal limits. Mucosal thickening is noted at the left maxillary sinus. The remaining paranasal sinuses and mastoid air cells are well-aerated. No significant soft tissue abnormalities are seen.  CT MAXILLOFACIAL FINDINGS  There is mild flattening of the left side of the nasal bone, which could reflect an acute fracture, though it could also be chronic in nature. There is no  additional evidence for acute fracture. The maxilla and mandible appear intact. The nasal bone is unremarkable in appearance. A periapical abscess is noted at the root of the left second maxillary premolar, which is also largely absent. This demonstrates cortical breakthrough, without significant overlying soft tissue inflammation. There is also partial absence of the left lateral maxillary incisor, and a small periapical abscess with regard to the right first mandibular molar.  The orbits are intact bilaterally. There is mucosal thickening within the left maxillary sinus. The remaining visualized paranasal sinuses and mastoid air cells are well-aerated.  No significant soft tissue abnormalities are seen. The parapharyngeal fat planes are preserved. The nasopharynx, oropharynx and hypopharynx are unremarkable in appearance. The visualized portions of the valleculae and piriform sinuses are grossly unremarkable.  The parotid and submandibular glands are within normal limits. No cervical lymphadenopathy is seen.  CT CERVICAL SPINE FINDINGS  There is no evidence of fracture or subluxation. Vertebral bodies demonstrate normal height and alignment. Multilevel disc space narrowing is noted along the cervical spine, particularly at C5-C6, with associated anterior and posterior disc osteophyte complexes and underlying facet disease. Prevertebral soft tissues are within normal limits.  The thyroid gland is unremarkable in appearance. Emphysema is noted at the upper lung lobes. No significant soft tissue abnormalities are seen.  IMPRESSION: 1. No evidence of traumatic intracranial injury or fracture. 2. No evidence of fracture or subluxation along the cervical spine. 3. Mild flattening of the left side of the nasal bone could reflect an acute fracture, though it could also be chronic in nature. 4. Periapical abscess at the root of the left second maxillary premolar, with partial absence of the tooth. This demonstrates  cortical breakthrough, without significant overlying soft tissue inflammation. Partial absence of the left lateral maxillary incisor, and small periapical abscess with regard to the right first mandibular molar. 5. Mucosal thickening within the left maxillary sinus. 6. Scattered small vessel ischemic microangiopathy. 7. Mild degenerative change along the cervical spine. 8. Emphysema at the upper lung lobes.   Electronically Signed   By: Roanna Raider M.D.   On: 09/04/2013 22:38   Ct Cervical Spine Wo Contrast  09/04/2013   CLINICAL DATA:  Status post fall; bilateral temporal headache, nasal pain and laceration at the chin. Concern for neck injury.  EXAM: CT HEAD WITHOUT CONTRAST  CT MAXILLOFACIAL WITHOUT CONTRAST  CT CERVICAL SPINE WITHOUT CONTRAST  TECHNIQUE: Multidetector CT imaging of the head, cervical spine, and maxillofacial structures were performed using the standard protocol without intravenous contrast. Multiplanar CT image reconstructions of the cervical spine and maxillofacial structures were also generated.  COMPARISON:  None.  FINDINGS: CT HEAD FINDINGS  There is no evidence of acute infarction, mass lesion, or intra- or extra-axial hemorrhage on CT.  Scattered periventricular and subcortical white matter change likely reflects small vessel ischemic microangiopathy. Mild increased density along the falx cerebri is thought to reflect calcification; no definite hemorrhage is seen.  The posterior fossa, including the cerebellum, brainstem  and fourth ventricle, is within normal limits. The third and lateral ventricles, and basal ganglia are unremarkable in appearance. The cerebral hemispheres are symmetric in appearance, with normal gray-white differentiation. No mass effect or midline shift is seen.  There is no evidence of fracture; visualized osseous structures are unremarkable in appearance. The orbits are within normal limits. Mucosal thickening is noted at the left maxillary sinus. The remaining  paranasal sinuses and mastoid air cells are well-aerated. No significant soft tissue abnormalities are seen.  CT MAXILLOFACIAL FINDINGS  There is mild flattening of the left side of the nasal bone, which could reflect an acute fracture, though it could also be chronic in nature. There is no additional evidence for acute fracture. The maxilla and mandible appear intact. The nasal bone is unremarkable in appearance. A periapical abscess is noted at the root of the left second maxillary premolar, which is also largely absent. This demonstrates cortical breakthrough, without significant overlying soft tissue inflammation. There is also partial absence of the left lateral maxillary incisor, and a small periapical abscess with regard to the right first mandibular molar.  The orbits are intact bilaterally. There is mucosal thickening within the left maxillary sinus. The remaining visualized paranasal sinuses and mastoid air cells are well-aerated.  No significant soft tissue abnormalities are seen. The parapharyngeal fat planes are preserved. The nasopharynx, oropharynx and hypopharynx are unremarkable in appearance. The visualized portions of the valleculae and piriform sinuses are grossly unremarkable.  The parotid and submandibular glands are within normal limits. No cervical lymphadenopathy is seen.  CT CERVICAL SPINE FINDINGS  There is no evidence of fracture or subluxation. Vertebral bodies demonstrate normal height and alignment. Multilevel disc space narrowing is noted along the cervical spine, particularly at C5-C6, with associated anterior and posterior disc osteophyte complexes and underlying facet disease. Prevertebral soft tissues are within normal limits.  The thyroid gland is unremarkable in appearance. Emphysema is noted at the upper lung lobes. No significant soft tissue abnormalities are seen.  IMPRESSION: 1. No evidence of traumatic intracranial injury or fracture. 2. No evidence of fracture or  subluxation along the cervical spine. 3. Mild flattening of the left side of the nasal bone could reflect an acute fracture, though it could also be chronic in nature. 4. Periapical abscess at the root of the left second maxillary premolar, with partial absence of the tooth. This demonstrates cortical breakthrough, without significant overlying soft tissue inflammation. Partial absence of the left lateral maxillary incisor, and small periapical abscess with regard to the right first mandibular molar. 5. Mucosal thickening within the left maxillary sinus. 6. Scattered small vessel ischemic microangiopathy. 7. Mild degenerative change along the cervical spine. 8. Emphysema at the upper lung lobes.   Electronically Signed   By: Roanna Raider M.D.   On: 09/04/2013 22:38   Ct Knee Left Wo Contrast  09/09/2013   CLINICAL DATA:  Left knee pain and swelling post fall.  EXAM: CT OF THE LEFT KNEE WITHOUT CONTRAST  TECHNIQUE: Multidetector CT imaging was performed according to the standard protocol. Multiplanar CT image reconstructions were also generated.  COMPARISON:  None.  FINDINGS: There is moderate decreased bone mineralization. Examination demonstrates a large joint effusion. There is moderate tricompartmental osteoarthritic change worse over the lateral compartment. There is no acute fracture or dislocation. There is a 1 cm loose body over the anterior midline of the knee joint. Remainder the exam is unremarkable.  IMPRESSION: No acute fracture dislocation.  Large joint effusion.  Moderate tricompartmental  osteoarthritis. 1 cm loose body over the anterior midline of the knee joint.   Electronically Signed   By: Elberta Fortis M.D.   On: 09/09/2013 02:28   Ct Abdomen Pelvis W Contrast  09/04/2013   CLINICAL DATA:  Chest and epigastric pain.  Acute worsening.  EXAM: CT ABDOMEN AND PELVIS WITH CONTRAST  TECHNIQUE: Multidetector CT imaging of the abdomen and pelvis was performed using the standard protocol following  bolus administration of intravenous contrast.  CONTRAST:  OMNIPAQUE IOHEXOL 300 MG/ML  SOLN  COMPARISON:  Ultrasound studies 2008  FINDINGS: Lung bases show mild scarring.  No consolidation.  No effusion.  The liver does not show any focal lesions. No calcified gallstones. The spleen is normal. The pancreas is normal. The adrenal glands are normal. The kidneys are normal. No cysts, mass, stone or hydronephrosis. The aorta and IVC are unremarkable. There is mild atherosclerosis of the aorta. Bladder, prostate gland and seminal vesicles appear unremarkable. There is an inguinal hernia on the right containing small intestine. No sign of strangulation or obstruction. The appendix is normal. No acute bowel pathology is visible. Ordinary mild degenerative changes affect the spine.  IMPRESSION: No acute finding. No cause of the pain is identified. The patient does have an inguinal hernia on the right containing small intestine but there is no evidence of obstruction or strangulation based on these images.   Electronically Signed   By: Paulina Fusi M.D.   On: 09/04/2013 12:56   Ct Maxillofacial Wo Cm  09/04/2013   CLINICAL DATA:  Status post fall; bilateral temporal headache, nasal pain and laceration at the chin. Concern for neck injury.  EXAM: CT HEAD WITHOUT CONTRAST  CT MAXILLOFACIAL WITHOUT CONTRAST  CT CERVICAL SPINE WITHOUT CONTRAST  TECHNIQUE: Multidetector CT imaging of the head, cervical spine, and maxillofacial structures were performed using the standard protocol without intravenous contrast. Multiplanar CT image reconstructions of the cervical spine and maxillofacial structures were also generated.  COMPARISON:  None.  FINDINGS: CT HEAD FINDINGS  There is no evidence of acute infarction, mass lesion, or intra- or extra-axial hemorrhage on CT.  Scattered periventricular and subcortical white matter change likely reflects small vessel ischemic microangiopathy. Mild increased density along the falx cerebri  is thought to reflect calcification; no definite hemorrhage is seen.  The posterior fossa, including the cerebellum, brainstem and fourth ventricle, is within normal limits. The third and lateral ventricles, and basal ganglia are unremarkable in appearance. The cerebral hemispheres are symmetric in appearance, with normal gray-white differentiation. No mass effect or midline shift is seen.  There is no evidence of fracture; visualized osseous structures are unremarkable in appearance. The orbits are within normal limits. Mucosal thickening is noted at the left maxillary sinus. The remaining paranasal sinuses and mastoid air cells are well-aerated. No significant soft tissue abnormalities are seen.  CT MAXILLOFACIAL FINDINGS  There is mild flattening of the left side of the nasal bone, which could reflect an acute fracture, though it could also be chronic in nature. There is no additional evidence for acute fracture. The maxilla and mandible appear intact. The nasal bone is unremarkable in appearance. A periapical abscess is noted at the root of the left second maxillary premolar, which is also largely absent. This demonstrates cortical breakthrough, without significant overlying soft tissue inflammation. There is also partial absence of the left lateral maxillary incisor, and a small periapical abscess with regard to the right first mandibular molar.  The orbits are intact bilaterally. There is  mucosal thickening within the left maxillary sinus. The remaining visualized paranasal sinuses and mastoid air cells are well-aerated.  No significant soft tissue abnormalities are seen. The parapharyngeal fat planes are preserved. The nasopharynx, oropharynx and hypopharynx are unremarkable in appearance. The visualized portions of the valleculae and piriform sinuses are grossly unremarkable.  The parotid and submandibular glands are within normal limits. No cervical lymphadenopathy is seen.  CT CERVICAL SPINE FINDINGS   There is no evidence of fracture or subluxation. Vertebral bodies demonstrate normal height and alignment. Multilevel disc space narrowing is noted along the cervical spine, particularly at C5-C6, with associated anterior and posterior disc osteophyte complexes and underlying facet disease. Prevertebral soft tissues are within normal limits.  The thyroid gland is unremarkable in appearance. Emphysema is noted at the upper lung lobes. No significant soft tissue abnormalities are seen.  IMPRESSION: 1. No evidence of traumatic intracranial injury or fracture. 2. No evidence of fracture or subluxation along the cervical spine. 3. Mild flattening of the left side of the nasal bone could reflect an acute fracture, though it could also be chronic in nature. 4. Periapical abscess at the root of the left second maxillary premolar, with partial absence of the tooth. This demonstrates cortical breakthrough, without significant overlying soft tissue inflammation. Partial absence of the left lateral maxillary incisor, and small periapical abscess with regard to the right first mandibular molar. 5. Mucosal thickening within the left maxillary sinus. 6. Scattered small vessel ischemic microangiopathy. 7. Mild degenerative change along the cervical spine. 8. Emphysema at the upper lung lobes.   Electronically Signed   By: Roanna Raider M.D.   On: 09/04/2013 22:38    Microbiology: Recent Results (from the past 240 hour(s))  MRSA PCR SCREENING     Status: None   Collection Time    09/05/13  2:38 AM      Result Value Ref Range Status   MRSA by PCR NEGATIVE  NEGATIVE Final   Comment:            The GeneXpert MRSA Assay (FDA     approved for NASAL specimens     only), is one component of a     comprehensive MRSA colonization     surveillance program. It is not     intended to diagnose MRSA     infection nor to guide or     monitor treatment for     MRSA infections.     Labs: Basic Metabolic Panel:  Recent  Labs Lab 09/05/13 0541 09/06/13 0307 09/07/13 0244 09/08/13 0715 09/09/13 0645  NA 133* 135* 132* 134* 134*  K 3.8 3.7 3.7 4.0 4.0  CL 98 97 94* 97 100  CO2 19 20 20 22 20   GLUCOSE 56* 79 113* 80 83  BUN 10 7 8 10 10   CREATININE 0.92 0.84 0.87 0.84 0.79  CALCIUM 8.0* 8.2* 8.2* 8.4 8.3*   Liver Function Tests:  Recent Labs Lab 09/04/13 0843 09/05/13 0541 09/06/13 0307 09/07/13 0244 09/09/13 0645  AST 93* 100* 99* 78* 54*  ALT 51 43 48 49 42  ALKPHOS 102 74 72 73 57  BILITOT 2.3* 1.7* 1.3* 1.2 1.2  PROT 7.5 6.1 5.9* 6.0 5.8*  ALBUMIN 3.5 2.9* 2.7* 2.8* 2.4*    Recent Labs Lab 09/04/13 0843  LIPASE 18    Recent Labs Lab 09/06/13 0307  AMMONIA 30   CBC:  Recent Labs Lab 09/04/13 2221 09/05/13 0541 09/06/13 0307 09/07/13 0244 09/09/13 0645  WBC 6.8  5.0 4.2 6.4 5.0  NEUTROABS 5.5  --   --   --   --   HGB 14.1 13.1 12.6* 13.1 12.7*  HCT 39.8 38.0* 36.2* 36.9* 36.0*  MCV 90.0 91.1 89.6 88.7 89.8  PLT 120* 104* 123* 129* 166   Cardiac Enzymes: No results found for this basename: CKTOTAL, CKMB, CKMBINDEX, TROPONINI,  in the last 168 hours BNP: BNP (last 3 results)  Recent Labs  09/04/13 0858  PROBNP 439.0*   CBG:  Recent Labs Lab 09/04/13 2119  GLUCAP 73       Signed:  Basilia Jumbo PA-S  [facility] 09/10/2013, 10:47 AM

## 2013-09-10 NOTE — Discharge Summary (Addendum)
Physician Discharge Summary  Alfred [name] FAO:[id number] DOB: Feb 28, 1951 DOA: 09/04/2013  PCP: No PCP Per Patient  Admit date: 09/04/2013 Discharge date: 09/10/2013  Time spent: 45 minutes  Recommendations for Outpatient Follow-up:  1. Follow up about alcohol and substance abuse. Jomarie LongsJoseph provided a list of of treatment faciliteis and a list of local AA meetings.  2. Initially was supposed to go to SNF, but he will be discharged home.  Discharge Diagnoses:  Principal Problem:   Delirium tremens Active Problems:   Protein-calorie malnutrition, severe   Mental status change   Knee effusion, left   Dental abscess   Alcohol abuse   Alcoholic hepatitis   Discharge Condition: stable   Diet recommendation: normal diet, no alcohol   Filed Weights   09/05/13 0200 09/07/13 1819  Weight: 65.3 kg (143 lb 15.4 oz) 67.132 kg (148 lb)    History of present illness:  63yo male with known Etoh abuse and no Etoh in 24 hours prior to presentation per ED report (pt not making any sense and family not available) who presented w/ worsening delerium and tremors. He was referring to horses, guns, etc.  In ED his VSS and labs were unremarkable. He was admitted for tx of Etoh withdrawal.    Hospital Course:   Acute alcohol withdrawal  -On CIWA protocol , Mental status improved today, Tremors have decreased since 09/08/2013  -Normal B12 level, off of restraints 09/08/2013. CIWA score 09/06/2013.  -09/10/13 - tremors scored on 1 today for CIWA, No agitation, No anxiety. [facility] -None was indicated the time of discharge.  Abdominal pain  -Patient likely patient has alcoholic hepatitis.  -AST/ALT ratio consistent with alcoholic hepatitis, patient abdominal pain is improving no complaints on 09/09/2013.  -Currently tolerating diet without difficulties.  - This is resolved at discharge.  Mild hyponatremia  -Sodium level is fluctuating, this is likely due to  his chronic alcohol abuse will improve with alcohol avoidance and proper diet   Mild thrombocytopenia  -Platelets low on 09/05/13 at 104,  09/06/13 platelet count 123  returned to acceptable levels -166 on 09/09/2013 -resolved   EtOH abuse/dependence  -Coags are normal - no evidence of severe liver dysfunction at hospital stay - Patient appears alert 09/10/13 and patient was educated by Evlyn CourierJoseph Cambell on 09/09/13 patient on alcohol abstinence, Patient plans on entering a SNF.   Dental abscesses  -On empiric abx coverage (clindamycin) - no evidence of complicating factors at present. Will continue antibiotics once disharged   Left knee osteoarthritis/effusion -Left knee pain with swelling, recent history of falls.  -CT scan indicated effusion, osteoarthritis, and a 1 cm floating body, consulted orthopedic for further treatment options.  -Orthopedics evaluated left knee on 09/09/13 and aspirated 200cc of fluid and then injected knee with 8mg  depomedrol and 5 cc of lidocaine.  -Patient report much improvement of pain and mobility. Orthopedics recommend follow up if need.  Disposition -Supposed to go to SNF but they requested guarantee letter. -the Hospital administration not felt comfortable with his history of alcohol abuse that he will stay for 30 days. - The letter of guarantee Declined by the hospital, pt is stable medically for DC, pt himself OK with going home    Procedures: 09/09/13 - Dr. Charlann Boxerlin, Lysle MoralesMathew aspirated left knee tonight removing 200cc of clear non-inflammatory based joint fluid  Then injected his knee with 8-mg of depomedrol and 5cc of lidocaine. He tolerated it well.     Consultations:  Orthopedic  Discharge Exam: Filed Vitals:   09/10/13 0455  BP: 121/80  Pulse: 74  Temp: 98.6 F (37 C)  Resp: 18   General: No acute respiratory distress - alert , appears uncomfortable  Lungs: Clear to auscultation bilaterally without wheezes or crackles  Cardiovascular: Regular rate and  rhythm without murmur gallop or rub  Abdomen: Nontender, nondistended, soft, bowel sounds positive, no rebound, no ascites, no appreciable mass  Extremities: Left knee, Minimal effusion, no erythema, no heat,no pain with movement, no crepitus. No significant cyanosis, clubbing, or edema bilateral lower extremities. Psych: Alert x3,   Discharge Instructions You were cared for by a hospitalist during your hospital stay. If you have any questions about your discharge medications or the care you received while you were in the hospital after you are discharged, you can call the unit and asked to speak with the hospitalist on call if the hospitalist that took care of you is not available. Once you are discharged, your primary care physician will handle any further medical issues. Please note that NO REFILLS for any discharge medications will be authorized once you are discharged, as it is imperative that you return to your primary care physician (or establish a relationship with a primary care physician if you do not have one) for your aftercare needs so that they can reassess your need for medications and monitor your lab values.  Discharge Orders   Future Orders Complete By Expires   Diet - low sodium heart healthy  As directed    Increase activity slowly  As directed        Medication List         aspirin EC 81 MG tablet  Take 81 mg by mouth daily.     clindamycin 300 MG capsule  Commonly known as:  CLEOCIN  Take 1 capsule (300 mg total) by mouth 3 (three) times daily.     ibuprofen 600 MG tablet  Commonly known as:  ADVIL,MOTRIN  Take 1 tablet (600 mg total) by mouth 3 (three) times daily as needed for headache.     thiamine 100 MG tablet  Take 1 tablet (100 mg total) by mouth daily.       No Known Allergies     Follow-up Information   Call Orthopedics. (As needed, If symptoms worsen)        The results of significant diagnostics from this hospitalization (including imaging,  microbiology, ancillary and laboratory) are listed below for reference.    Significant Diagnostic Studies: Dg Chest 2 View  09/04/2013   CLINICAL DATA:  Status post fall; altered mental status.  EXAM: CHEST  2 VIEW  COMPARISON:  Chest radiograph performed earlier today at 9:05 a.m.  FINDINGS: The lungs are well-aerated. Minimal left-sided atelectasis is noted. There is no evidence of pleural effusion or pneumothorax.  The heart is normal in size; the mediastinal contour is within normal limits. No acute osseous abnormalities are seen.  IMPRESSION: Minimal left-sided atelectasis noted; lungs otherwise clear.   Electronically Signed   By: Roanna Raider M.D.   On: 09/04/2013 22:12   Dg Chest 2 View  09/04/2013   CLINICAL DATA:  Chest pain.  Shortness of breath.  EXAM: CHEST  2 VIEW  COMPARISON:  05/20/2013  FINDINGS: Heart size is normal. Mediastinal shadows are normal. The lungs are hyperinflated suggesting emphysema. No evidence of infiltrate, mass, effusion or collapse. Bony density related to the distal first ribs again noted.  IMPRESSION: No active cardiopulmonary disease.  Probable emphysema.   Electronically Signed   By: Paulina Fusi M.D.   On: 09/04/2013 09:18   Ct Head Wo Contrast  09/04/2013   CLINICAL DATA:  Status post fall; bilateral temporal headache, nasal pain and laceration at the chin. Concern for neck injury.  EXAM: CT HEAD WITHOUT CONTRAST  CT MAXILLOFACIAL WITHOUT CONTRAST  CT CERVICAL SPINE WITHOUT CONTRAST  TECHNIQUE: Multidetector CT imaging of the head, cervical spine, and maxillofacial structures were performed using the standard protocol without intravenous contrast. Multiplanar CT image reconstructions of the cervical spine and maxillofacial structures were also generated.  COMPARISON:  None.  FINDINGS: CT HEAD FINDINGS  There is no evidence of acute infarction, mass lesion, or intra- or extra-axial hemorrhage on CT.  Scattered periventricular and subcortical white matter change  likely reflects small vessel ischemic microangiopathy. Mild increased density along the falx cerebri is thought to reflect calcification; no definite hemorrhage is seen.  The posterior fossa, including the cerebellum, brainstem and fourth ventricle, is within normal limits. The third and lateral ventricles, and basal ganglia are unremarkable in appearance. The cerebral hemispheres are symmetric in appearance, with normal gray-white differentiation. No mass effect or midline shift is seen.  There is no evidence of fracture; visualized osseous structures are unremarkable in appearance. The orbits are within normal limits. Mucosal thickening is noted at the left maxillary sinus. The remaining paranasal sinuses and mastoid air cells are well-aerated. No significant soft tissue abnormalities are seen.  CT MAXILLOFACIAL FINDINGS  There is mild flattening of the left side of the nasal bone, which could reflect an acute fracture, though it could also be chronic in nature. There is no additional evidence for acute fracture. The maxilla and mandible appear intact. The nasal bone is unremarkable in appearance. A periapical abscess is noted at the root of the left second maxillary premolar, which is also largely absent. This demonstrates cortical breakthrough, without significant overlying soft tissue inflammation. There is also partial absence of the left lateral maxillary incisor, and a small periapical abscess with regard to the right first mandibular molar.  The orbits are intact bilaterally. There is mucosal thickening within the left maxillary sinus. The remaining visualized paranasal sinuses and mastoid air cells are well-aerated.  No significant soft tissue abnormalities are seen. The parapharyngeal fat planes are preserved. The nasopharynx, oropharynx and hypopharynx are unremarkable in appearance. The visualized portions of the valleculae and piriform sinuses are grossly unremarkable.  The parotid and submandibular  glands are within normal limits. No cervical lymphadenopathy is seen.  CT CERVICAL SPINE FINDINGS  There is no evidence of fracture or subluxation. Vertebral bodies demonstrate normal height and alignment. Multilevel disc space narrowing is noted along the cervical spine, particularly at C5-C6, with associated anterior and posterior disc osteophyte complexes and underlying facet disease. Prevertebral soft tissues are within normal limits.  The thyroid gland is unremarkable in appearance. Emphysema is noted at the upper lung lobes. No significant soft tissue abnormalities are seen.  IMPRESSION: 1. No evidence of traumatic intracranial injury or fracture. 2. No evidence of fracture or subluxation along the cervical spine. 3. Mild flattening of the left side of the nasal bone could reflect an acute fracture, though it could also be chronic in nature. 4. Periapical abscess at the root of the left second maxillary premolar, with partial absence of the tooth. This demonstrates cortical breakthrough, without significant overlying soft tissue inflammation. Partial absence of the left lateral maxillary incisor, and small periapical abscess with regard to the right  first mandibular molar. 5. Mucosal thickening within the left maxillary sinus. 6. Scattered small vessel ischemic microangiopathy. 7. Mild degenerative change along the cervical spine. 8. Emphysema at the upper lung lobes.   Electronically Signed   By: Roanna Raider M.D.   On: 09/04/2013 22:38   Ct Cervical Spine Wo Contrast  09/04/2013   CLINICAL DATA:  Status post fall; bilateral temporal headache, nasal pain and laceration at the chin. Concern for neck injury.  EXAM: CT HEAD WITHOUT CONTRAST  CT MAXILLOFACIAL WITHOUT CONTRAST  CT CERVICAL SPINE WITHOUT CONTRAST  TECHNIQUE: Multidetector CT imaging of the head, cervical spine, and maxillofacial structures were performed using the standard protocol without intravenous contrast. Multiplanar CT image  reconstructions of the cervical spine and maxillofacial structures were also generated.  COMPARISON:  None.  FINDINGS: CT HEAD FINDINGS  There is no evidence of acute infarction, mass lesion, or intra- or extra-axial hemorrhage on CT.  Scattered periventricular and subcortical white matter change likely reflects small vessel ischemic microangiopathy. Mild increased density along the falx cerebri is thought to reflect calcification; no definite hemorrhage is seen.  The posterior fossa, including the cerebellum, brainstem and fourth ventricle, is within normal limits. The third and lateral ventricles, and basal ganglia are unremarkable in appearance. The cerebral hemispheres are symmetric in appearance, with normal gray-white differentiation. No mass effect or midline shift is seen.  There is no evidence of fracture; visualized osseous structures are unremarkable in appearance. The orbits are within normal limits. Mucosal thickening is noted at the left maxillary sinus. The remaining paranasal sinuses and mastoid air cells are well-aerated. No significant soft tissue abnormalities are seen.  CT MAXILLOFACIAL FINDINGS  There is mild flattening of the left side of the nasal bone, which could reflect an acute fracture, though it could also be chronic in nature. There is no additional evidence for acute fracture. The maxilla and mandible appear intact. The nasal bone is unremarkable in appearance. A periapical abscess is noted at the root of the left second maxillary premolar, which is also largely absent. This demonstrates cortical breakthrough, without significant overlying soft tissue inflammation. There is also partial absence of the left lateral maxillary incisor, and a small periapical abscess with regard to the right first mandibular molar.  The orbits are intact bilaterally. There is mucosal thickening within the left maxillary sinus. The remaining visualized paranasal sinuses and mastoid air cells are  well-aerated.  No significant soft tissue abnormalities are seen. The parapharyngeal fat planes are preserved. The nasopharynx, oropharynx and hypopharynx are unremarkable in appearance. The visualized portions of the valleculae and piriform sinuses are grossly unremarkable.  The parotid and submandibular glands are within normal limits. No cervical lymphadenopathy is seen.  CT CERVICAL SPINE FINDINGS  There is no evidence of fracture or subluxation. Vertebral bodies demonstrate normal height and alignment. Multilevel disc space narrowing is noted along the cervical spine, particularly at C5-C6, with associated anterior and posterior disc osteophyte complexes and underlying facet disease. Prevertebral soft tissues are within normal limits.  The thyroid gland is unremarkable in appearance. Emphysema is noted at the upper lung lobes. No significant soft tissue abnormalities are seen.  IMPRESSION: 1. No evidence of traumatic intracranial injury or fracture. 2. No evidence of fracture or subluxation along the cervical spine. 3. Mild flattening of the left side of the nasal bone could reflect an acute fracture, though it could also be chronic in nature. 4. Periapical abscess at the root of the left second maxillary premolar, with partial  absence of the tooth. This demonstrates cortical breakthrough, without significant overlying soft tissue inflammation. Partial absence of the left lateral maxillary incisor, and small periapical abscess with regard to the right first mandibular molar. 5. Mucosal thickening within the left maxillary sinus. 6. Scattered small vessel ischemic microangiopathy. 7. Mild degenerative change along the cervical spine. 8. Emphysema at the upper lung lobes.   Electronically Signed   By: Roanna Raider M.D.   On: 09/04/2013 22:38   Ct Knee Left Wo Contrast  09/09/2013   CLINICAL DATA:  Left knee pain and swelling post fall.  EXAM: CT OF THE LEFT KNEE WITHOUT CONTRAST  TECHNIQUE: Multidetector CT  imaging was performed according to the standard protocol. Multiplanar CT image reconstructions were also generated.  COMPARISON:  None.  FINDINGS: There is moderate decreased bone mineralization. Examination demonstrates a large joint effusion. There is moderate tricompartmental osteoarthritic change worse over the lateral compartment. There is no acute fracture or dislocation. There is a 1 cm loose body over the anterior midline of the knee joint. Remainder the exam is unremarkable.  IMPRESSION: No acute fracture dislocation.  Large joint effusion.  Moderate tricompartmental osteoarthritis. 1 cm loose body over the anterior midline of the knee joint.   Electronically Signed   By: Elberta Fortis M.D.   On: 09/09/2013 02:28   Ct Abdomen Pelvis W Contrast  09/04/2013   CLINICAL DATA:  Chest and epigastric pain.  Acute worsening.  EXAM: CT ABDOMEN AND PELVIS WITH CONTRAST  TECHNIQUE: Multidetector CT imaging of the abdomen and pelvis was performed using the standard protocol following bolus administration of intravenous contrast.  CONTRAST:  OMNIPAQUE IOHEXOL 300 MG/ML  SOLN  COMPARISON:  Ultrasound studies 2008  FINDINGS: Lung bases show mild scarring.  No consolidation.  No effusion.  The liver does not show any focal lesions. No calcified gallstones. The spleen is normal. The pancreas is normal. The adrenal glands are normal. The kidneys are normal. No cysts, mass, stone or hydronephrosis. The aorta and IVC are unremarkable. There is mild atherosclerosis of the aorta. Bladder, prostate gland and seminal vesicles appear unremarkable. There is an inguinal hernia on the right containing small intestine. No sign of strangulation or obstruction. The appendix is normal. No acute bowel pathology is visible. Ordinary mild degenerative changes affect the spine.  IMPRESSION: No acute finding. No cause of the pain is identified. The patient does have an inguinal hernia on the right containing small intestine but there  is no evidence of obstruction or strangulation based on these images.   Electronically Signed   By: Paulina Fusi M.D.   On: 09/04/2013 12:56   Ct Maxillofacial Wo Cm  09/04/2013   CLINICAL DATA:  Status post fall; bilateral temporal headache, nasal pain and laceration at the chin. Concern for neck injury.  EXAM: CT HEAD WITHOUT CONTRAST  CT MAXILLOFACIAL WITHOUT CONTRAST  CT CERVICAL SPINE WITHOUT CONTRAST  TECHNIQUE: Multidetector CT imaging of the head, cervical spine, and maxillofacial structures were performed using the standard protocol without intravenous contrast. Multiplanar CT image reconstructions of the cervical spine and maxillofacial structures were also generated.  COMPARISON:  None.  FINDINGS: CT HEAD FINDINGS  There is no evidence of acute infarction, mass lesion, or intra- or extra-axial hemorrhage on CT.  Scattered periventricular and subcortical white matter change likely reflects small vessel ischemic microangiopathy. Mild increased density along the falx cerebri is thought to reflect calcification; no definite hemorrhage is seen.  The posterior fossa, including the cerebellum, brainstem  and fourth ventricle, is within normal limits. The third and lateral ventricles, and basal ganglia are unremarkable in appearance. The cerebral hemispheres are symmetric in appearance, with normal gray-white differentiation. No mass effect or midline shift is seen.  There is no evidence of fracture; visualized osseous structures are unremarkable in appearance. The orbits are within normal limits. Mucosal thickening is noted at the left maxillary sinus. The remaining paranasal sinuses and mastoid air cells are well-aerated. No significant soft tissue abnormalities are seen.  CT MAXILLOFACIAL FINDINGS  There is mild flattening of the left side of the nasal bone, which could reflect an acute fracture, though it could also be chronic in nature. There is no additional evidence for acute fracture. The maxilla and  mandible appear intact. The nasal bone is unremarkable in appearance. A periapical abscess is noted at the root of the left second maxillary premolar, which is also largely absent. This demonstrates cortical breakthrough, without significant overlying soft tissue inflammation. There is also partial absence of the left lateral maxillary incisor, and a small periapical abscess with regard to the right first mandibular molar.  The orbits are intact bilaterally. There is mucosal thickening within the left maxillary sinus. The remaining visualized paranasal sinuses and mastoid air cells are well-aerated.  No significant soft tissue abnormalities are seen. The parapharyngeal fat planes are preserved. The nasopharynx, oropharynx and hypopharynx are unremarkable in appearance. The visualized portions of the valleculae and piriform sinuses are grossly unremarkable.  The parotid and submandibular glands are within normal limits. No cervical lymphadenopathy is seen.  CT CERVICAL SPINE FINDINGS  There is no evidence of fracture or subluxation. Vertebral bodies demonstrate normal height and alignment. Multilevel disc space narrowing is noted along the cervical spine, particularly at C5-C6, with associated anterior and posterior disc osteophyte complexes and underlying facet disease. Prevertebral soft tissues are within normal limits.  The thyroid gland is unremarkable in appearance. Emphysema is noted at the upper lung lobes. No significant soft tissue abnormalities are seen.  IMPRESSION: 1. No evidence of traumatic intracranial injury or fracture. 2. No evidence of fracture or subluxation along the cervical spine. 3. Mild flattening of the left side of the nasal bone could reflect an acute fracture, though it could also be chronic in nature. 4. Periapical abscess at the root of the left second maxillary premolar, with partial absence of the tooth. This demonstrates cortical breakthrough, without significant overlying soft  tissue inflammation. Partial absence of the left lateral maxillary incisor, and small periapical abscess with regard to the right first mandibular molar. 5. Mucosal thickening within the left maxillary sinus. 6. Scattered small vessel ischemic microangiopathy. 7. Mild degenerative change along the cervical spine. 8. Emphysema at the upper lung lobes.   Electronically Signed   By: Roanna Raider M.D.   On: 09/04/2013 22:38    Microbiology: Recent Results (from the past 240 hour(s))  MRSA PCR SCREENING     Status: None   Collection Time    09/05/13  2:38 AM      Result Value Ref Range Status   MRSA by PCR NEGATIVE  NEGATIVE Final   Comment:            The GeneXpert MRSA Assay (FDA     approved for NASAL specimens     only), is one component of a     comprehensive MRSA colonization     surveillance program. It is not     intended to diagnose MRSA     infection nor  to guide or     monitor treatment for     MRSA infections.     Labs: Basic Metabolic Panel:  Recent Labs Lab 09/05/13 0541 09/06/13 0307 09/07/13 0244 09/08/13 0715 09/09/13 0645  NA 133* 135* 132* 134* 134*  K 3.8 3.7 3.7 4.0 4.0  CL 98 97 94* 97 100  CO2 19 20 20 22 20   GLUCOSE 56* 79 113* 80 83  BUN 10 7 8 10 10   CREATININE 0.92 0.84 0.87 0.84 0.79  CALCIUM 8.0* 8.2* 8.2* 8.4 8.3*   Liver Function Tests:  Recent Labs Lab 09/04/13 0843 09/05/13 0541 09/06/13 0307 09/07/13 0244 09/09/13 0645  AST 93* 100* 99* 78* 54*  ALT 51 43 48 49 42  ALKPHOS 102 74 72 73 57  BILITOT 2.3* 1.7* 1.3* 1.2 1.2  PROT 7.5 6.1 5.9* 6.0 5.8*  ALBUMIN 3.5 2.9* 2.7* 2.8* 2.4*    Recent Labs Lab 09/04/13 0843  LIPASE 18    Recent Labs Lab 09/06/13 0307  AMMONIA 30   CBC:  Recent Labs Lab 09/04/13 2221 09/05/13 0541 09/06/13 0307 09/07/13 0244 09/09/13 0645  WBC 6.8 5.0 4.2 6.4 5.0  NEUTROABS 5.5  --   --   --   --   HGB 14.1 13.1 12.6* 13.1 12.7*  HCT 39.8 38.0* 36.2* 36.9* 36.0*  MCV 90.0 91.1 89.6  88.7 89.8  PLT 120* 104* 123* 129* 166   Cardiac Enzymes: No results found for this basename: CKTOTAL, CKMB, CKMBINDEX, TROPONINI,  in the last 168 hours BNP: BNP (last 3 results)  Recent Labs  09/04/13 0858  PROBNP 439.0*   CBG:  Recent Labs Lab 09/04/13 2119  GLUCAP 73       Signed:  Basilia Jumbo PA-S  [facility] 09/10/2013, 12:21 PM    Addendum  Patient seen and examined, chart and data base reviewed.  I agree with the above assessment and plan.  For full details please see Mrs. Johnn Hai PA-S note.  I reviewed an amended the above note as needed.   Clint Lipps, MD [facility] Pager: [phone] 09/10/2013, 1:54 PM

## 2013-09-10 NOTE — Clinical Social Work Note (Signed)
Unable to find patient a SNF bed that would accept him with Medicaid. Patient ready for DC today. Patient states that he believes he can manage at home and will have support of his roommate who is at bedside. Patient will be transported home by his roommate. RNCM notified of need for any HH, DME, outpatient PT if available for patient. MD notified notified of change in patient's disposition. CSW signing off at this time.   Roddie McBryant Takeyah Wieman MSW, [place] Point[place], [place], [phone]

## 2013-09-10 NOTE — Discharge Instructions (Signed)
Alcohol Intoxication Alcohol intoxication occurs when the amount of alcohol that a person has consumed impairs his or her ability to mentally and physically function. Alcohol directly impairs the normal chemical activity of the brain. Drinking large amounts of alcohol can lead to changes in mental function and behavior, and it can cause many physical effects that can be harmful.  Alcohol intoxication can range in severity from mild to very severe. Various factors can affect the level of intoxication that occurs, such as the person's age, gender, weight, frequency of alcohol consumption, and the presence of other medical conditions (such as diabetes, seizures, or heart conditions). Dangerous levels of alcohol intoxication may occur when people drink large amounts of alcohol in a short period (binge drinking). Alcohol can also be especially dangerous when combined with certain prescription medicines or "recreational" drugs. SIGNS AND SYMPTOMS Some common signs and symptoms of mild alcohol intoxication include:  Loss of coordination.  Changes in mood and behavior.  Impaired judgment.  Slurred speech. As alcohol intoxication progresses to more severe levels, other signs and symptoms will appear. These may include:  Vomiting.  Confusion and impaired memory.  Slowed breathing.  Seizures.  Loss of consciousness. DIAGNOSIS  Your health care provider will take a medical history and perform a physical exam. You will be asked about the amount and type of alcohol you have consumed. Blood tests will be done to measure the concentration of alcohol in your blood. In many places, your blood alcohol level must be lower than 80 mg/dL (1.19%0.08%) to legally drive. However, many dangerous effects of alcohol can occur at much lower levels.  TREATMENT  People with alcohol intoxication often do not require treatment. Most of the effects of alcohol intoxication are temporary, and they go away as the alcohol naturally  leaves the body. Your health care provider will monitor your condition until you are stable enough to go home. Fluids are sometimes given through an IV access tube to help prevent dehydration.  HOME CARE INSTRUCTIONS  Do not drive after drinking alcohol.  Stay hydrated. Drink enough water and fluids to keep your urine clear or pale yellow. Avoid caffeine.   Only take over-the-counter or prescription medicines as directed by your health care provider.  SEEK MEDICAL CARE IF:   You have persistent vomiting.   You do not feel better after a few days.  You have frequent alcohol intoxication. Your health care provider can help determine if you should see a substance use treatment counselor. SEEK IMMEDIATE MEDICAL CARE IF:   You become shaky or tremble when you try to stop drinking.   You shake uncontrollably (seizure).   You throw up (vomit) blood. This may be bright red or may look like black coffee grounds.   You have blood in your stool. This may be bright red or may appear as a black, tarry, bad smelling stool.   You become lightheaded or faint.  MAKE SURE YOU:   Understand these instructions.  Will watch your condition.  Will get help right away if you are not doing well or get worse. Document Released: 03/01/2005 Document Revised: 01/22/2013 Document Reviewed: 10/25/2012 [facility] Patient Information 2014 [place], [place].  Alcohol Intoxication Alcohol intoxication occurs when you drink enough alcohol that it affects your ability to function. It can be mild or very severe. Drinking a lot of alcohol in a short time is called binge drinking. This can be very harmful. Drinking alcohol can also be more dangerous if you are taking medicines or  other drugs. Some of the effects caused by alcohol may include:  Loss of coordination.  Changes in mood and behavior.  Unclear thinking.  Trouble talking (slurred speech).  Throwing up (vomiting).  Confusion.  Slowed  breathing.  Twitching and shaking (seizures).  Loss of consciousness. HOME CARE  Do not drive after drinking alcohol.  Drink enough water and fluids to keep your pee (urine) clear or pale yellow. Avoid caffeine.  Only take medicine as told by your doctor. GET HELP IF:  You throw up (vomit) many times.  You do not feel better after a few days.  You frequently have alcohol intoxication. Your doctor can help decide if you should see a substance use treatment counselor. GET HELP RIGHT AWAY IF:  You become shaky when you stop drinking.  You have twitching and shaking.  You throw up blood. It may look bright red or like coffee grounds.  You notice blood in your poop (bowel movements).  You become lightheaded or pass out (faint). MAKE SURE YOU:   Understand these instructions.  Will watch your condition.  Will get help right away if you are not doing well or get worse. Document Released: 11/08/2007 Document Revised: 01/22/2013 Document Reviewed: 10/25/2012 [facility] Patient Information 2014 [place], [place].

## 2013-09-10 NOTE — Progress Notes (Signed)
Patient was discharged home by MD order; discharged instructions  review and give to patient and family with care notes and prescriptions; IV DIC; skin intact; patient will be escorted to the car by nurse tech via wheelchair.

## 2014-12-12 ENCOUNTER — Emergency Department (HOSPITAL_COMMUNITY): Payer: Medicaid Other

## 2014-12-12 ENCOUNTER — Inpatient Hospital Stay (HOSPITAL_COMMUNITY)
Admission: EM | Admit: 2014-12-12 | Discharge: 2014-12-26 | DRG: 329 | Disposition: A | Discharge status: Home / Self Care | Payer: Medicaid Other | Attending: Internal Medicine | Admitting: Internal Medicine

## 2014-12-12 ENCOUNTER — Encounter (HOSPITAL_COMMUNITY): Admission: EM | Disposition: A | Discharge status: Home / Self Care | Payer: Self-pay | Source: Home / Self Care

## 2014-12-12 ENCOUNTER — Emergency Department (HOSPITAL_COMMUNITY): Payer: Medicaid Other | Admitting: Certified Registered"

## 2014-12-12 ENCOUNTER — Encounter (HOSPITAL_COMMUNITY): Payer: Self-pay | Admitting: Emergency Medicine

## 2014-12-12 DIAGNOSIS — K4090 Unilateral inguinal hernia, without obstruction or gangrene, not specified as recurrent: Secondary | ICD-10-CM

## 2014-12-12 DIAGNOSIS — Z720 Tobacco use: Secondary | ICD-10-CM | POA: Diagnosis present

## 2014-12-12 DIAGNOSIS — R1011 Right upper quadrant pain: Secondary | ICD-10-CM

## 2014-12-12 DIAGNOSIS — K7030 Alcoholic cirrhosis of liver without ascites: Secondary | ICD-10-CM | POA: Diagnosis present

## 2014-12-12 DIAGNOSIS — Z23 Encounter for immunization: Secondary | ICD-10-CM

## 2014-12-12 DIAGNOSIS — E440 Moderate protein-calorie malnutrition: Secondary | ICD-10-CM | POA: Diagnosis present

## 2014-12-12 DIAGNOSIS — K7031 Alcoholic cirrhosis of liver with ascites: Secondary | ICD-10-CM | POA: Diagnosis present

## 2014-12-12 DIAGNOSIS — R030 Elevated blood-pressure reading, without diagnosis of hypertension: Secondary | ICD-10-CM

## 2014-12-12 DIAGNOSIS — Z4659 Encounter for fitting and adjustment of other gastrointestinal appliance and device: Secondary | ICD-10-CM

## 2014-12-12 DIAGNOSIS — K265 Chronic or unspecified duodenal ulcer with perforation: Principal | ICD-10-CM | POA: Diagnosis present

## 2014-12-12 DIAGNOSIS — B1920 Unspecified viral hepatitis C without hepatic coma: Secondary | ICD-10-CM | POA: Insufficient documentation

## 2014-12-12 DIAGNOSIS — M2342 Loose body in knee, left knee: Secondary | ICD-10-CM | POA: Diagnosis present

## 2014-12-12 DIAGNOSIS — F1010 Alcohol abuse, uncomplicated: Secondary | ICD-10-CM | POA: Diagnosis present

## 2014-12-12 DIAGNOSIS — R7989 Other specified abnormal findings of blood chemistry: Secondary | ICD-10-CM | POA: Insufficient documentation

## 2014-12-12 DIAGNOSIS — Z681 Body mass index (BMI) 19 or less, adult: Secondary | ICD-10-CM

## 2014-12-12 DIAGNOSIS — F17210 Nicotine dependence, cigarettes, uncomplicated: Secondary | ICD-10-CM | POA: Diagnosis present

## 2014-12-12 DIAGNOSIS — Z800 Family history of malignant neoplasm of digestive organs: Secondary | ICD-10-CM

## 2014-12-12 DIAGNOSIS — K7581 Nonalcoholic steatohepatitis (NASH): Secondary | ICD-10-CM | POA: Diagnosis present

## 2014-12-12 DIAGNOSIS — Z5320 Procedure and treatment not carried out because of patient's decision for unspecified reasons: Secondary | ICD-10-CM

## 2014-12-12 DIAGNOSIS — D696 Thrombocytopenia, unspecified: Secondary | ICD-10-CM | POA: Diagnosis not present

## 2014-12-12 DIAGNOSIS — F1020 Alcohol dependence, uncomplicated: Secondary | ICD-10-CM | POA: Diagnosis present

## 2014-12-12 DIAGNOSIS — M1712 Unilateral primary osteoarthritis, left knee: Secondary | ICD-10-CM | POA: Diagnosis present

## 2014-12-12 DIAGNOSIS — E162 Hypoglycemia, unspecified: Secondary | ICD-10-CM | POA: Diagnosis not present

## 2014-12-12 DIAGNOSIS — Y838 Other surgical procedures as the cause of abnormal reaction of the patient, or of later complication, without mention of misadventure at the time of the procedure: Secondary | ICD-10-CM | POA: Diagnosis not present

## 2014-12-12 DIAGNOSIS — R52 Pain, unspecified: Secondary | ICD-10-CM

## 2014-12-12 DIAGNOSIS — R945 Abnormal results of liver function studies: Secondary | ICD-10-CM

## 2014-12-12 DIAGNOSIS — F10231 Alcohol dependence with withdrawal delirium: Secondary | ICD-10-CM | POA: Diagnosis not present

## 2014-12-12 DIAGNOSIS — D649 Anemia, unspecified: Secondary | ICD-10-CM | POA: Diagnosis present

## 2014-12-12 DIAGNOSIS — Z791 Long term (current) use of non-steroidal anti-inflammatories (NSAID): Secondary | ICD-10-CM

## 2014-12-12 DIAGNOSIS — M25462 Effusion, left knee: Secondary | ICD-10-CM

## 2014-12-12 DIAGNOSIS — R748 Abnormal levels of other serum enzymes: Secondary | ICD-10-CM

## 2014-12-12 DIAGNOSIS — D72829 Elevated white blood cell count, unspecified: Secondary | ICD-10-CM

## 2014-12-12 DIAGNOSIS — K668 Other specified disorders of peritoneum: Secondary | ICD-10-CM

## 2014-12-12 DIAGNOSIS — Z0189 Encounter for other specified special examinations: Secondary | ICD-10-CM

## 2014-12-12 DIAGNOSIS — K650 Generalized (acute) peritonitis: Secondary | ICD-10-CM | POA: Diagnosis present

## 2014-12-12 DIAGNOSIS — K567 Ileus, unspecified: Secondary | ICD-10-CM | POA: Diagnosis not present

## 2014-12-12 DIAGNOSIS — T85638A Leakage of other specified internal prosthetic devices, implants and grafts, initial encounter: Secondary | ICD-10-CM | POA: Diagnosis not present

## 2014-12-12 DIAGNOSIS — IMO0001 Reserved for inherently not codable concepts without codable children: Secondary | ICD-10-CM

## 2014-12-12 DIAGNOSIS — E876 Hypokalemia: Secondary | ICD-10-CM | POA: Diagnosis not present

## 2014-12-12 DIAGNOSIS — R1010 Upper abdominal pain, unspecified: Secondary | ICD-10-CM

## 2014-12-12 HISTORY — DX: Periapical abscess without sinus: K04.7

## 2014-12-12 HISTORY — PX: LAPAROSCOPIC ABDOMINAL EXPLORATION: SHX6249

## 2014-12-12 HISTORY — DX: Altered mental status, unspecified: R41.82

## 2014-12-12 HISTORY — DX: Effusion, left knee: M25.462

## 2014-12-12 HISTORY — DX: Alcohol dependence with withdrawal delirium: F10.231

## 2014-12-12 LAB — CBC WITH DIFFERENTIAL/PLATELET
BASOS ABS: 0 10*3/uL (ref 0.0–0.1)
Basophils Relative: 1 % (ref 0–1)
EOS PCT: 0 % (ref 0–5)
Eosinophils Absolute: 0 10*3/uL (ref 0.0–0.7)
HCT: 37.6 % — ABNORMAL LOW (ref 39.0–52.0)
Hemoglobin: 13.2 g/dL (ref 13.0–17.0)
LYMPHS PCT: 23 % (ref 12–46)
Lymphs Abs: 0.7 10*3/uL (ref 0.7–4.0)
MCH: 30.7 pg (ref 26.0–34.0)
MCHC: 35.1 g/dL (ref 30.0–36.0)
MCV: 87.4 fL (ref 78.0–100.0)
MONOS PCT: 3 % (ref 3–12)
Monocytes Absolute: 0.1 10*3/uL (ref 0.1–1.0)
NEUTROS PCT: 73 % (ref 43–77)
Neutro Abs: 2.4 10*3/uL (ref 1.7–7.7)
PLATELETS: 117 10*3/uL — AB (ref 150–400)
RBC: 4.3 MIL/uL (ref 4.22–5.81)
RDW: 15 % (ref 11.5–15.5)
WBC: 3.2 10*3/uL — ABNORMAL LOW (ref 4.0–10.5)

## 2014-12-12 LAB — I-STAT CG4 LACTIC ACID, ED: Lactic Acid, Venous: 2.55 mmol/L (ref 0.5–2.0)

## 2014-12-12 LAB — COMPREHENSIVE METABOLIC PANEL
ALT: 241 U/L — AB (ref 17–63)
AST: 396 U/L — ABNORMAL HIGH (ref 15–41)
Albumin: 3 g/dL — ABNORMAL LOW (ref 3.5–5.0)
Alkaline Phosphatase: 132 U/L — ABNORMAL HIGH (ref 38–126)
Anion gap: 11 (ref 5–15)
BUN: 8 mg/dL (ref 6–20)
CO2: 19 mmol/L — ABNORMAL LOW (ref 22–32)
Calcium: 8.2 mg/dL — ABNORMAL LOW (ref 8.9–10.3)
Chloride: 101 mmol/L (ref 101–111)
Creatinine, Ser: 0.64 mg/dL (ref 0.61–1.24)
GFR calc non Af Amer: >60 mL/min (ref >60)
Glucose, Bld: 87 mg/dL (ref 65–99)
POTASSIUM: 3.6 mmol/L (ref 3.5–5.1)
SODIUM: 131 mmol/L — AB (ref 135–145)
TOTAL PROTEIN: 6.4 g/dL — AB (ref 6.5–8.1)
Total Bilirubin: 3.2 mg/dL — ABNORMAL HIGH (ref 0.3–1.2)

## 2014-12-12 LAB — AMMONIA: AMMONIA: 31 umol/L (ref 9–35)

## 2014-12-12 LAB — LIPASE, BLOOD: Lipase: 36 U/L (ref 22–51)

## 2014-12-12 LAB — PROTIME-INR
INR: 1 (ref 0.00–1.49)
Prothrombin Time: 13.4 seconds (ref 11.6–15.2)

## 2014-12-12 LAB — ETHANOL: Alcohol, Ethyl (B): 90 mg/dL — ABNORMAL HIGH (ref <5)

## 2014-12-12 SURGERY — EXPLORATION, ABDOMEN, LAPAROSCOPIC
Anesthesia: General

## 2014-12-12 MED ORDER — METOPROLOL TARTRATE 1 MG/ML IV SOLN
5.0000 mg | Freq: Four times a day (QID) | INTRAVENOUS | Status: DC | PRN
  Filled 2014-12-12 (×2): qty 5

## 2014-12-12 MED ORDER — PROMETHAZINE HCL 25 MG/ML IJ SOLN: 6.2500 mg | INTRAMUSCULAR | Status: DC | PRN

## 2014-12-12 MED ORDER — ROCURONIUM BROMIDE 100 MG/10ML IV SOLN
INTRAVENOUS | Status: AC
  Filled 2014-12-12: qty 1

## 2014-12-12 MED ORDER — LIDOCAINE HCL (CARDIAC) 20 MG/ML IV SOLN
INTRAVENOUS | Status: AC
  Filled 2014-12-12: qty 5

## 2014-12-12 MED ORDER — PHENYLEPHRINE HCL 10 MG/ML IJ SOLN
INTRAMUSCULAR | Status: DC | PRN
  Administered 2014-12-12 (×3): 80 ug via INTRAVENOUS

## 2014-12-12 MED ORDER — MIDAZOLAM HCL 5 MG/5ML IJ SOLN
INTRAMUSCULAR | Status: DC | PRN
  Administered 2014-12-12 (×2): 1 mg via INTRAVENOUS

## 2014-12-12 MED ORDER — BUPIVACAINE-EPINEPHRINE 0.25% -1:200000 IJ SOLN
INTRAMUSCULAR | Status: AC
  Filled 2014-12-12: qty 3

## 2014-12-12 MED ORDER — SODIUM CHLORIDE 0.9 % IV SOLN
8.0000 mg | Freq: Four times a day (QID) | INTRAVENOUS | Status: DC | PRN
  Filled 2014-12-12: qty 4

## 2014-12-12 MED ORDER — PIPERACILLIN-TAZOBACTAM 3.375 G IVPB 30 MIN
3.3750 g | Freq: Once | INTRAVENOUS | Status: AC
  Administered 2014-12-12: 3.375 g via INTRAVENOUS
  Filled 2014-12-12: qty 50

## 2014-12-12 MED ORDER — PANTOPRAZOLE SODIUM 40 MG IV SOLR: 40.0000 mg | Freq: Two times a day (BID) | INTRAVENOUS | Status: DC

## 2014-12-12 MED ORDER — ROCURONIUM BROMIDE 100 MG/10ML IV SOLN
INTRAVENOUS | Status: DC | PRN
  Administered 2014-12-12: 20 mg via INTRAVENOUS

## 2014-12-12 MED ORDER — METHOCARBAMOL 1000 MG/10ML IJ SOLN
1000.0000 mg | Freq: Four times a day (QID) | INTRAVENOUS | Status: DC | PRN
  Filled 2014-12-12: qty 10

## 2014-12-12 MED ORDER — IOHEXOL 300 MG/ML  SOLN
100.0000 mL | Freq: Once | INTRAMUSCULAR | Status: AC | PRN
Start: 2014-12-12 — End: 2014-12-12
  Administered 2014-12-12: 100 mL via INTRAVENOUS

## 2014-12-12 MED ORDER — ACETAMINOPHEN 650 MG RE SUPP: 650.0000 mg | Freq: Four times a day (QID) | RECTAL | Status: DC | PRN

## 2014-12-12 MED ORDER — BISACODYL 10 MG RE SUPP: 10.0000 mg | Freq: Two times a day (BID) | RECTAL | Status: DC | PRN

## 2014-12-12 MED ORDER — MIDAZOLAM HCL 2 MG/2ML IJ SOLN
INTRAMUSCULAR | Status: AC
  Filled 2014-12-12: qty 2

## 2014-12-12 MED ORDER — MAGIC MOUTHWASH
15.0000 mL | Freq: Four times a day (QID) | ORAL | Status: DC | PRN
  Filled 2014-12-12: qty 15

## 2014-12-12 MED ORDER — SODIUM CHLORIDE 0.9 % IV BOLUS (SEPSIS)
1000.0000 mL | Freq: Once | INTRAVENOUS | Status: AC
  Administered 2014-12-12: 1000 mL via INTRAVENOUS

## 2014-12-12 MED ORDER — LIDOCAINE HCL 2 % EX GEL
CUTANEOUS | Status: AC
  Administered 2014-12-12: 22:00:00
  Filled 2014-12-12: qty 10

## 2014-12-12 MED ORDER — SODIUM CHLORIDE 0.9 % IV BOLUS (SEPSIS)
2000.0000 mL | Freq: Once | INTRAVENOUS | Status: DC
Start: 2014-12-12 — End: 2014-12-12

## 2014-12-12 MED ORDER — MENTHOL 3 MG MT LOZG: 1.0000 | LOZENGE | OROMUCOSAL | Status: DC | PRN

## 2014-12-12 MED ORDER — ALUM & MAG HYDROXIDE-SIMETH 200-200-20 MG/5ML PO SUSP: 30.0000 mL | Freq: Four times a day (QID) | ORAL | Status: DC | PRN

## 2014-12-12 MED ORDER — GENTAMICIN SULFATE 40 MG/ML IJ SOLN
INTRAMUSCULAR | Status: DC
  Filled 2014-12-12: qty 6

## 2014-12-12 MED ORDER — LORAZEPAM 2 MG/ML IJ SOLN: 0.0000 mg | Freq: Two times a day (BID) | INTRAMUSCULAR | Status: DC

## 2014-12-12 MED ORDER — ONDANSETRON HCL 4 MG/2ML IJ SOLN: 4.0000 mg | Freq: Four times a day (QID) | INTRAMUSCULAR | Status: DC | PRN

## 2014-12-12 MED ORDER — HYDROMORPHONE HCL 1 MG/ML IJ SOLN
1.0000 mg | Freq: Once | INTRAMUSCULAR | Status: AC
  Administered 2014-12-12: 1 mg via INTRAVENOUS
  Filled 2014-12-12: qty 1

## 2014-12-12 MED ORDER — PROPOFOL 10 MG/ML IV BOLUS
INTRAVENOUS | Status: AC
  Filled 2014-12-12: qty 20

## 2014-12-12 MED ORDER — PANTOPRAZOLE SODIUM 40 MG IV SOLR
40.0000 mg | Freq: Once | INTRAVENOUS | Status: AC
  Administered 2014-12-12: 40 mg via INTRAVENOUS
  Filled 2014-12-12: qty 40

## 2014-12-12 MED ORDER — FENTANYL CITRATE (PF) 100 MCG/2ML IJ SOLN
INTRAMUSCULAR | Status: DC | PRN
  Administered 2014-12-12 – 2014-12-13 (×5): 50 ug via INTRAVENOUS

## 2014-12-12 MED ORDER — PHENOL 1.4 % MT LIQD
2.0000 | OROMUCOSAL | Status: DC | PRN
  Filled 2014-12-12 (×2): qty 177

## 2014-12-12 MED ORDER — DIPHENHYDRAMINE HCL 50 MG/ML IJ SOLN
12.5000 mg | Freq: Four times a day (QID) | INTRAMUSCULAR | Status: DC | PRN
  Administered 2014-12-16: 12.5 mg via INTRAVENOUS
  Administered 2014-12-17: 25 mg via INTRAVENOUS
  Administered 2014-12-18: 12.5 mg via INTRAVENOUS
  Filled 2014-12-12 (×3): qty 1

## 2014-12-12 MED ORDER — HYDROMORPHONE HCL 1 MG/ML IJ SOLN
0.5000 mg | INTRAMUSCULAR | Status: DC | PRN
  Administered 2014-12-13 – 2014-12-16 (×11): 1 mg via INTRAVENOUS
  Administered 2014-12-17: 0.5 mg via INTRAVENOUS
  Administered 2014-12-18 – 2014-12-20 (×8): 1 mg via INTRAVENOUS
  Filled 2014-12-12 (×20): qty 1

## 2014-12-12 MED ORDER — LORAZEPAM 2 MG/ML IJ SOLN
0.0000 mg | Freq: Four times a day (QID) | INTRAMUSCULAR | Status: AC
  Administered 2014-12-13: 1 mg via INTRAVENOUS
  Filled 2014-12-12: qty 1

## 2014-12-12 MED ORDER — LIP MEDEX EX OINT
1.0000 "application " | TOPICAL_OINTMENT | Freq: Two times a day (BID) | CUTANEOUS | Status: DC
  Administered 2014-12-13 – 2014-12-26 (×27): 1 via TOPICAL
  Filled 2014-12-12 (×2): qty 7

## 2014-12-12 MED ORDER — HYDROMORPHONE HCL 2 MG/ML IJ SOLN
2.0000 mg | Freq: Once | INTRAMUSCULAR | Status: AC
  Administered 2014-12-12: 2 mg via INTRAMUSCULAR
  Filled 2014-12-12: qty 1

## 2014-12-12 MED ORDER — SUCCINYLCHOLINE CHLORIDE 20 MG/ML IJ SOLN
INTRAMUSCULAR | Status: DC | PRN
  Administered 2014-12-12: 100 mg via INTRAVENOUS

## 2014-12-12 MED ORDER — LACTATED RINGERS IV BOLUS (SEPSIS): 1000.0000 mL | Freq: Three times a day (TID) | INTRAVENOUS | Status: AC | PRN

## 2014-12-12 MED ORDER — SODIUM CHLORIDE 0.9 % IV SOLN
Freq: Once | INTRAVENOUS | Status: AC
  Administered 2014-12-12 (×2): via INTRAVENOUS

## 2014-12-12 MED ORDER — FENTANYL CITRATE (PF) 250 MCG/5ML IJ SOLN
INTRAMUSCULAR | Status: AC
  Filled 2014-12-12: qty 5

## 2014-12-12 MED ORDER — PROPOFOL 10 MG/ML IV BOLUS
INTRAVENOUS | Status: DC | PRN
  Administered 2014-12-12: 160 mg via INTRAVENOUS

## 2014-12-12 SURGICAL SUPPLY — 74 items
APPLIER CLIP 5 13 M/L LIGAMAX5 (MISCELLANEOUS)
APPLIER CLIP ROT 10 11.4 M/L (STAPLE)
APR CLP MED LRG 11.4X10 (STAPLE)
APR CLP MED LRG 5 ANG JAW (MISCELLANEOUS)
CABLE HIGH FREQUENCY MONO STRZ (ELECTRODE) ×3 IMPLANT
CATH KIT ON-Q SILVERSOAK 7.5 (CATHETERS) IMPLANT
CATH KIT ON-Q SILVERSOAK 7.5IN (CATHETERS) IMPLANT
CELLS DAT CNTRL 66122 CELL SVR (MISCELLANEOUS) IMPLANT
CHLORAPREP W/TINT 26ML (MISCELLANEOUS) ×3 IMPLANT
CLIP APPLIE 5 13 M/L LIGAMAX5 (MISCELLANEOUS) IMPLANT
CLIP APPLIE ROT 10 11.4 M/L (STAPLE) IMPLANT
COUNTER NEEDLE 20 DBL MAG RED (NEEDLE) ×3 IMPLANT
DECANTER SPIKE VIAL GLASS SM (MISCELLANEOUS) ×3 IMPLANT
DRAIN CHANNEL 19F RND (DRAIN) IMPLANT
DRAPE LAPAROSCOPIC ABDOMINAL (DRAPES) ×3 IMPLANT
DRAPE SURG IRRIG POUCH 19X23 (DRAPES) ×3 IMPLANT
DRSG OPSITE POSTOP 4X10 (GAUZE/BANDAGES/DRESSINGS) IMPLANT
DRSG OPSITE POSTOP 4X6 (GAUZE/BANDAGES/DRESSINGS) IMPLANT
DRSG OPSITE POSTOP 4X8 (GAUZE/BANDAGES/DRESSINGS) IMPLANT
DRSG TEGADERM 2-3/8X2-3/4 SM (GAUZE/BANDAGES/DRESSINGS) ×6 IMPLANT
DRSG TEGADERM 4X4.75 (GAUZE/BANDAGES/DRESSINGS) ×2 IMPLANT
DRSG TELFA 3X8 NADH (GAUZE/BANDAGES/DRESSINGS) ×3 IMPLANT
ELECT PENCIL ROCKER SW 15FT (MISCELLANEOUS) ×2 IMPLANT
ELECT REM PT RETURN 15FT ADLT (MISCELLANEOUS) ×3 IMPLANT
ENDOLOOP SUT PDS II  0 18 (SUTURE)
ENDOLOOP SUT PDS II 0 18 (SUTURE) IMPLANT
EVACUATOR SILICONE 100CC (DRAIN) IMPLANT
GAUZE SPONGE 2X2 8PLY STRL LF (GAUZE/BANDAGES/DRESSINGS) ×1 IMPLANT
GAUZE SPONGE 4X4 12PLY STRL (GAUZE/BANDAGES/DRESSINGS) ×3 IMPLANT
GLOVE ECLIPSE 8.0 STRL XLNG CF (GLOVE) ×6 IMPLANT
GLOVE INDICATOR 8.0 STRL GRN (GLOVE) ×6 IMPLANT
GOWN STRL REUS W/TWL XL LVL3 (GOWN DISPOSABLE) ×12 IMPLANT
LEGGING LITHOTOMY PAIR STRL (DRAPES) IMPLANT
LUBRICANT JELLY K Y 4OZ (MISCELLANEOUS) IMPLANT
PACK COLON (CUSTOM PROCEDURE TRAY) ×3 IMPLANT
PAD DRESSING TELFA 3X8 NADH (GAUZE/BANDAGES/DRESSINGS) IMPLANT
PEN SKIN MARKING BROAD (MISCELLANEOUS) ×3 IMPLANT
PORT LAP GEL ALEXIS MED 5-9CM (MISCELLANEOUS) IMPLANT
RETRACTOR WND ALEXIS 18 MED (MISCELLANEOUS) IMPLANT
RTRCTR WOUND ALEXIS 18CM MED (MISCELLANEOUS)
SCISSORS LAP 5X35 DISP (ENDOMECHANICALS) ×3 IMPLANT
SEALER TISSUE G2 STRG ARTC 35C (ENDOMECHANICALS) IMPLANT
SET IRRIG TUBING LAPAROSCOPIC (IRRIGATION / IRRIGATOR) ×3 IMPLANT
SLEEVE XCEL OPT CAN 5 100 (ENDOMECHANICALS) ×6 IMPLANT
SPONGE GAUZE 2X2 STER 10/PKG (GAUZE/BANDAGES/DRESSINGS) ×2
SPONGE LAP 18X18 X RAY DECT (DISPOSABLE) ×3 IMPLANT
STAPLER VISISTAT 35W (STAPLE) IMPLANT
SUCTION POOLE TIP (SUCTIONS) ×1 IMPLANT
SUT MNCRL AB 4-0 PS2 18 (SUTURE) ×3 IMPLANT
SUT PDS AB 1 CTX 36 (SUTURE) IMPLANT
SUT PDS AB 1 TP1 96 (SUTURE) IMPLANT
SUT PROLENE 0 CT 2 (SUTURE) IMPLANT
SUT PROLENE 2 0 SH DA (SUTURE) IMPLANT
SUT SILK 2 0 (SUTURE) ×3
SUT SILK 2 0 SH CR/8 (SUTURE) ×1 IMPLANT
SUT SILK 2-0 18XBRD TIE 12 (SUTURE) ×1 IMPLANT
SUT SILK 3 0 (SUTURE) ×3
SUT SILK 3 0 SH CR/8 (SUTURE) ×1 IMPLANT
SUT SILK 3-0 18XBRD TIE 12 (SUTURE) ×1 IMPLANT
SUT V-LOC BARB 180 2/0GR6 GS22 (SUTURE) ×6
SUT VICRYL 0 UR6 27IN ABS (SUTURE) IMPLANT
SUTURE V-LC BRB 180 2/0GR6GS22 (SUTURE) IMPLANT
SYS LAPSCP GELPORT 120MM (MISCELLANEOUS)
SYSTEM LAPSCP GELPORT 120MM (MISCELLANEOUS) IMPLANT
TAPE UMBILICAL COTTON 1/8X30 (MISCELLANEOUS) IMPLANT
TOWEL OR 17X26 10 PK STRL BLUE (TOWEL DISPOSABLE) IMPLANT
TOWEL OR NON WOVEN STRL DISP B (DISPOSABLE) IMPLANT
TRAY FOLEY W/METER SILVER 14FR (SET/KITS/TRAYS/PACK) ×3 IMPLANT
TROCAR BLADELESS OPT 5 100 (ENDOMECHANICALS) ×3 IMPLANT
TROCAR XCEL NON-BLD 11X100MML (ENDOMECHANICALS) ×3 IMPLANT
TUBING CONNECTING 10 (TUBING) IMPLANT
TUBING CONNECTING 10' (TUBING)
TUBING FILTER THERMOFLATOR (ELECTROSURGICAL) ×3 IMPLANT
TUNNELER SHEATH ON-Q 16GX12 DP (PAIN MANAGEMENT) IMPLANT

## 2014-12-12 NOTE — Anesthesia Preprocedure Evaluation (Addendum)
Anesthesia Evaluation  Patient identified by MRN, date of birth, ID band Patient awake    Reviewed: Allergy & Precautions, H&P , NPO status , Patient's Chart, lab work & pertinent test results, reviewed documented beta blocker date and time   Airway Mallampati: II  TM Distance: >3 FB Neck ROM: full    Dental  (+) Poor Dentition, Missing, Dental Advisory Given   Pulmonary Current Smoker,  breath sounds clear to auscultation  Pulmonary exam normal       Cardiovascular Exercise Tolerance: Good negative cardio ROS Normal cardiovascular examRhythm:regular Rate:Normal     Neuro/Psych negative neurological ROS  negative psych ROS   GI/Hepatic negative GI ROS, (+) Cirrhosis -    substance abuse  alcohol use, Hepatitis -, UnspecifiedFree air   Endo/Other  negative endocrine ROS  Renal/GU negative Renal ROS  negative genitourinary   Musculoskeletal   Abdominal   Peds  Hematology negative hematology ROS (+)   Anesthesia Other Findings   Reproductive/Obstetrics negative OB ROS                             Anesthesia Physical Anesthesia Plan  ASA: IV and emergent  Anesthesia Plan: General   Post-op Pain Management:    Induction: Intravenous, Rapid sequence and Cricoid pressure planned  Airway Management Planned: Oral ETT  Additional Equipment:   Intra-op Plan:   Post-operative Plan: Possible Post-op intubation/ventilation  Informed Consent: I have reviewed the patients History and Physical, chart, labs and discussed the procedure including the risks, benefits and alternatives for the proposed anesthesia with the patient or authorized representative who has indicated his/her understanding and acceptance.   Dental Advisory Given  Plan Discussed with: Surgeon  Anesthesia Plan Comments:         Anesthesia Quick Evaluation

## 2014-12-12 NOTE — H&P (Signed)
[facility]  [address] [place], [place] [address] Phone: [phone] FAX: [place]  1951-01-29 [id number]  CARE TEAM:  PCP: No PCP Per Patient  Outpatient Care Team: Patient Care Team: No Pcp Per Patient as PCP - General (General Practice)  Inpatient Treatment Team: Treatment Team: Attending Provider: Ernestina Patches, MD; Registered Nurse: Jonell Cluck, RN; Technician: Lynder Parents Ratchford, NT  This patient is a 64 y.o.male who presents today for surgical evaluation at the request of Dr Tawnya Crook.   Reason for evaluation: Abd pain ? cholecystitis  Smoking drinking male with worsening 2 day history of upper abdominal pain.  Decreased appetite.  Nauseated but not during up.  Drinks a fifth of liquor and at least a sixpack of beer a day.  Smokes about a pack of cigarettes a day.  Has been admitted in the past for alcohol toxicity and delirium tremens.  He does not have a doctor.  His father had colon cancer.  He has never had a colonoscopy.  Cousin worsening pain, he can emergency room.  Upper abdominal pain concerning.  Ultrasound showed some mild gallbladder wall thickening.  No evidence of cholecystitis.  Liver function tests all elevated.  Concern for cholecystitis.  Surgical consultation requested.  I saw the patient he was tachycardic 100-110's.  Complaining of epigastric pain.  Normally eats anything he wants.  No episodes of biliary colic.  He has had some nausea but no vomiting.  He does occasionally take ibuprofen and aspirin.  Does not recall these had an ulcer.  Never had endoscopy.  Has not tried any antacid medications.  Normally has a BM every day.  No bouts of constipation or diarrhea.  No sick contacts nor any recent travel history.    No hematochezia.  No melanoma.  No bright red blood per rectum.  No history of hemorrhoidal bleeding.  No history of coffee-ground emesis or hematemesis.  Does not  recall being told he has gastric varices.  There is no obvious records from [place], [facility], [place], [place].   There is no one here with him.  I believe somewhat estranged from his sister but sometimes contacts her.  History reviewed. No pertinent past medical history.  History reviewed. No pertinent past surgical history.  History   Social History  . Marital Status: Divorced    Spouse Name: N/A  . Number of Children: N/A  . Years of Education: N/A   Occupational History  . Not on file.   Social History Main Topics  . Smoking status: Current Every Day Smoker -- 1.00 packs/day    Types: Cigarettes  . Smokeless tobacco: Not on file  . Alcohol Use: Yes     Comment: 6 pack a day/heavy  . Drug Use: No  . Sexual Activity: Not Currently   Other Topics Concern  . Not on file   Social History Narrative    History reviewed. No pertinent family history.  No current facility-administered medications for this encounter.   Current Outpatient Prescriptions  Medication Sig Dispense Refill  . ibuprofen (ADVIL,MOTRIN) 200 MG tablet Take 800 mg by mouth every 6 (six) hours as needed for headache, mild pain or moderate pain.    Marland Kitchen ibuprofen (ADVIL,MOTRIN) 600 MG tablet Take 1 tablet (600 mg total) by mouth 3 (three) times daily as needed for headache. (Patient not taking: Reported on 12/12/2014) 30 tablet 0  . thiamine 100 MG tablet Take  1 tablet (100 mg total) by mouth daily. (Patient not taking: Reported on 12/12/2014) 30 tablet 1     No Known Allergies  ROS: Constitutional:  No fevers, chills, sweats.  Weight stable Eyes:  No vision changes, No discharge HENT:  No sore throats, nasal drainage Lymph: No neck swelling, No bruising easily Pulmonary:  No cough, productive sputum CV: No orthopnea, PND  No exertional chest/neck/shoulder/arm pain. GI:  No personal nor family history of inflammatory bowel disease, irritable bowel syndrome, allergy such as Celiac Sprue, dietary/dairy problems,  colitis, ulcers nor gastritis.  No recent sick contacts/gastroenteritis.  No travel outside the country.  No changes in diet. Father had colon cancer Renal: No UTIs, No hematuria Genital:  No drainage, bleeding, masses Musculoskeletal: No severe joint pain.  Good ROM major joints Skin:  No sores or lesions.  No rashes Heme/Lymph:  No easy bleeding.  No swollen lymph nodes Neuro: No focal weakness/numbness.  No seizures Psych: No suicidal ideation.  No hallucinations  BP 117/74 mmHg  Pulse 95  Temp(Src) 98.1 F (36.7 C) (Oral)  Resp 24  SpO2 100%  Physical Exam: General: Pt awake/alert/oriented x4 in mild/moderate acute distress Eyes: PERRL, normal EOM. Sclera jaundiced Neuro: CN II-XII intact w/o focal sensory/motor deficits. Lymph: No head/neck/groin lymphadenopathy Psych:  No delerium/psychosis/paranoia HENT: Normocephalic, Mucus membranes moist.  No thrush Neck: Supple, No tracheal deviation Chest: No pain.  Good respiratory excursion. CV:  Pulses intact.  Regular rhythm.  HR 110s Abdomen: Firm  Mildly distended.  Tenderness to palpation in epigastric region with guarding and suspicious peritonitis.  No major pain and right upper quadrant.  No Murphy sign.  No diastases.  No umbilical hernia. Genital: Normal external male genitalia.  Large right inguinal hernia down to scrotum with soft small bowel.  Reducible.  No definite left Ing hernia.  Epididymis and testes normal.  No meatal blood Ext:  SCDs BLE.  No significant edema.  No cyanosis Skin: No petechiae / purpurea.  No major sores Musculoskeletal: No severe joint pain.  Good ROM major joints   Results:   Labs: Results for orders placed or performed during the hospital encounter of 12/12/14 (from the past 48 hour(s))  CBC with Differential/Platelet     Status: Abnormal   Collection Time: 12/12/14  4:21 PM  Result Value Ref Range   WBC 3.2 (L) 4.0 - 10.5 K/uL   RBC 4.30 4.22 - 5.81 MIL/uL   Hemoglobin 13.2 13.0 - 17.0  g/dL   HCT 37.6 (L) 39.0 - 52.0 %   MCV 87.4 78.0 - 100.0 fL   MCH 30.7 26.0 - 34.0 pg   MCHC 35.1 30.0 - 36.0 g/dL   RDW 15.0 11.5 - 15.5 %   Platelets 117 (L) 150 - 400 K/uL    Comment: SPECIMEN CHECKED FOR CLOTS REPEATED TO VERIFY PLATELET COUNT CONFIRMED BY SMEAR    Neutrophils Relative % 73 43 - 77 %   Lymphocytes Relative 23 12 - 46 %   Monocytes Relative 3 3 - 12 %   Eosinophils Relative 0 0 - 5 %   Basophils Relative 1 0 - 1 %   Neutro Abs 2.4 1.7 - 7.7 K/uL   Lymphs Abs 0.7 0.7 - 4.0 K/uL   Monocytes Absolute 0.1 0.1 - 1.0 K/uL   Eosinophils Absolute 0.0 0.0 - 0.7 K/uL   Basophils Absolute 0.0 0.0 - 0.1 K/uL   RBC Morphology TARGET CELLS   Comprehensive metabolic panel     Status:  Abnormal   Collection Time: 12-21-14  4:21 PM  Result Value Ref Range   Sodium 131 (L) 135 - 145 mmol/L   Potassium 3.6 3.5 - 5.1 mmol/L   Chloride 101 101 - 111 mmol/L   CO2 19 (L) 22 - 32 mmol/L   Glucose, Bld 87 65 - 99 mg/dL   BUN 8 6 - 20 mg/dL   Creatinine, Ser 0.64 0.61 - 1.24 mg/dL   Calcium 8.2 (L) 8.9 - 10.3 mg/dL   Total Protein 6.4 (L) 6.5 - 8.1 g/dL   Albumin 3.0 (L) 3.5 - 5.0 g/dL   AST 396 (H) 15 - 41 U/L   ALT 241 (H) 17 - 63 U/L   Alkaline Phosphatase 132 (H) 38 - 126 U/L   Total Bilirubin 3.2 (H) 0.3 - 1.2 mg/dL   GFR calc non Af Amer >60 >60 mL/min   GFR calc Af Amer >60 >60 mL/min    Comment: (NOTE) The eGFR has been calculated using the CKD EPI equation. This calculation has not been validated in all clinical situations. eGFR's persistently <60 mL/min signify possible Chronic Kidney Disease.    Anion gap 11 5 - 15  Lipase, blood     Status: None   Collection Time: 12/21/14  4:21 PM  Result Value Ref Range   Lipase 36 22 - 51 U/L  Ethanol     Status: Abnormal   Collection Time: 12-21-14  4:21 PM  Result Value Ref Range   Alcohol, Ethyl (B) 90 (H) <5 mg/dL    Comment:        LOWEST DETECTABLE LIMIT FOR SERUM ALCOHOL IS 5 mg/dL FOR MEDICAL PURPOSES ONLY    I-Stat CG4 Lactic Acid, ED     Status: Abnormal   Collection Time: 2014/12/21  4:29 PM  Result Value Ref Range   Lactic Acid, Venous 2.55 (HH) 0.5 - 2.0 mmol/L   Comment NOTIFIED PHYSICIAN     Imaging / Studies: US Abdomen Limited Ruq  2014/12/21   CLINICAL DATA:  Acute onset of right upper quadrant abdominal pain. Initial encounter.  EXAM: US ABDOMEN LIMITED - RIGHT UPPER QUADRANT  COMPARISON:  CT of the abdomen and pelvis performed 09/04/2013  FINDINGS: Gallbladder:  Mild gallbladder wall thickening is noted. This may reflect mild chronic inflammation. No stones are identified. No pericholecystic fluid is seen. No ultrasonographic Murphy's sign is elicited.  Common bile duct:  Diameter: 0.3 cm, within normal limits in caliber.  Liver:  No focal lesion identified. Diffusely nodular contour raises concern for hepatic cirrhosis, better characterized than on the prior study. Mildly increased parenchymal echogenicity could reflect mild fatty infiltration.  IMPRESSION: 1. Mild gallbladder wall thickening may reflect mild chronic inflammation. No stones seen. No evidence for obstruction or acute cholecystitis. 2. Diffusely nodular hepatic contour raises concern for hepatic cirrhosis, better characterized than on the prior study. Mild fatty infiltration within the liver.   Electronically Signed   By: Garald Balding M.D.   On: 12/21/2014 18:23    Medications / Allergies: per chart  Antibiotics: Anti-infectives    None      Assessment  Alfred [name]  64 y.o. male       Problem List:  Principal Problem:   Pneumoperitoneum Active Problems:   Protein-calorie malnutrition, moderate   Alcohol abuse   Cirrhosis, alcoholic   Tobacco abuse   Abdominal pain.  Not consistent with biliary colic or cholecystitis.  Pneumoperitoneum.  Most and upper abdomen with epigastric pain.  Suspicious for  perforated ulcer given history of alcohol, tobacco, non-steroidal use.  Other possibilities include  diverticular perforation or other bowel perforation  Jaundice and elevated liver function test.  Suspicious for cirrhosis on ultrasound and possibly on CT scan.  Not obviously severe  Plan:  My initial examination did not seem consistent with cholecystitis.  He had some mild gallbladder wall thickening but in the setting of probable cirrhosis, no gallbladder is normal.  Initially ordered a HIDA scan until the x-rays came back.  I had to help  my partner do emergency surgery at the other hospital.  Xrays with diagnosis of free air.  CT scan suspicious in the upper abdomen.  Reviewed with radiology.  They hesitate to make a source Dx, but to me he has chronically inflamed antrum suspicious for chronic ulcer that is probably now perforated.  I only see a few areas of gas on the descending colon.  Nothing down in the pelvis or sigmoid colon that would argue towards a perforated diverticulitis .  He possibly could have a descending colon diverticulitis but that is atypical and usually is in the sigmoid.  No definite obvious bulky tumor although limited since not a fully contrasted CT in the interest of time.  I strongly recommend the patient get up abdominal exploration.  We will try and do diagnostic laparoscopy with a laparoscopic omental Phillip Heal patch if the ulcer truly is found.  Low threshold to convert to open if he is enough gland stable or diagnosis cannot be easily obtained.  Her biopsy.  Possible cholecystectomy.  Possible bowel resection.  The anatomy & physiology of the digestive tract was discussed.  The pathophysiology of perforation was discussed.  Differential diagnosis such as perforated ulcer or colon, etc was discussed.   Natural history risks without surgery such as death was discussed.  I recommended abdominal exploration to diagnose & treat the source of the problem.  Laparoscopic & open techniques were discussed.   Risks such as bleeding, infection, abscess, leak, reoperation, bowel  resection, possible ostomy, hernia, heart attack, death, and other risks were discussed.   The risks of no intervention will lead to serious problems including death.   I expressed a good likelihood that surgery will address the problem.    Goals of post-operative recovery were discussed as well.  We will work to minimize complications although risks in an emergent setting are high.   Questions were answered.  The patient expressed understanding & wishes to proceed with surgery.       Most likely he will need an ICU bed postop.  Low threshold to have critical care involved.  Woodlike medicines input given the probable circumflex cirrhosis or at least chronic hepatitis.  I again strongly recommend the patient he quit smoking and stop drinking as he is shortening his life spanning and causing further problems.  Proton pump inhibitor.  IV antibiotics.  NGT   Watch out for alcohol withdrawal.  -VTE prophylaxis- SCDs, etc -mobilize as tolerated to help recovery    Adin Hector, M.D., F.A.C.S. Gastrointestinal and Minimally Invasive Surgery [facility] [address], [place], [place] [address] [phone] Main / Paging   12/12/2014  Note: Portions of this report may have been transcribed using voice recognition software. Every effort was made to ensure accuracy; however, inadvertent computerized transcription errors may be present.   Any transcriptional errors that result from this process are unintentional.

## 2014-12-12 NOTE — ED Notes (Signed)
Notified Dr. Micheline Mazeocherty of Lactic 2.55

## 2014-12-12 NOTE — Consult Note (Signed)
[facility]  [address] [place], [place] [address] Phone: [phone] FAX: [place]  Nov 10, 1950 [id number]  CARE TEAM:  PCP: No PCP Per Patient  Outpatient Care Team: Patient Care Team: No Pcp Per Patient as PCP - General (General Practice)  Inpatient Treatment Team: Treatment Team: Attending Provider: Ernestina Patches, MD; Registered Nurse: Jonell Cluck, RN; Technician: Lynder Parents Ratchford, NT  This patient is a 64 y.o.male who presents today for surgical evaluation at the request of Dr Tawnya Crook.   Reason for evaluation: Abd pain ? cholecystitis  Smoking drinking male with worsening 2 day history of upper abdominal pain.  Decreased appetite.  Nauseated but not during up.  Drinks a fifth of liquor and at least a sixpack of beer a day.  Smokes about a pack of cigarettes a day.  Has been admitted in the past for alcohol toxicity and delirium tremens.  He does not have a doctor.  His father had colon cancer.  He has never had a colonoscopy.  Cousin worsening pain, he can emergency room.  Upper abdominal pain concerning.  Ultrasound showed some mild gallbladder wall thickening.  No evidence of cholecystitis.  Liver function tests all elevated.  Concern for cholecystitis.  Surgical consultation requested.  I saw the patient he was tachycardic 100-110's.  Complaining of epigastric pain.  Normally eats anything he wants.  No episodes of biliary colic.  He has had some nausea but no vomiting.  He does occasionally take ibuprofen and aspirin.  Does not recall these had an ulcer.  Never had endoscopy.  Has not tried any antacid medications.  Normally has a BM every day.  No bouts of constipation or diarrhea.  No sick contacts nor any recent travel history.    No hematochezia.  No melanoma.  No bright red blood per rectum.  No history of hemorrhoidal bleeding.  No history of coffee-ground emesis or hematemesis.  Does not  recall being told he has gastric varices.  There is no obvious records from [place], [facility], [place], [place].   There is no one here with him.  I believe somewhat estranged from his sister but sometimes contacts her.  History reviewed. No pertinent past medical history.  History reviewed. No pertinent past surgical history.  History   Social History  . Marital Status: Divorced    Spouse Name: N/A  . Number of Children: N/A  . Years of Education: N/A   Occupational History  . Not on file.   Social History Main Topics  . Smoking status: Current Every Day Smoker -- 1.00 packs/day    Types: Cigarettes  . Smokeless tobacco: Not on file  . Alcohol Use: Yes     Comment: 6 pack a day/heavy  . Drug Use: No  . Sexual Activity: Not Currently   Other Topics Concern  . Not on file   Social History Narrative    History reviewed. No pertinent family history.  No current facility-administered medications for this encounter.   Current Outpatient Prescriptions  Medication Sig Dispense Refill  . ibuprofen (ADVIL,MOTRIN) 200 MG tablet Take 800 mg by mouth every 6 (six) hours as needed for headache, mild pain or moderate pain.    Marland Kitchen ibuprofen (ADVIL,MOTRIN) 600 MG tablet Take 1 tablet (600 mg total) by mouth 3 (three) times daily as needed for headache. (Patient not taking: Reported on 12/12/2014) 30 tablet 0  . thiamine 100 MG tablet Take  1 tablet (100 mg total) by mouth daily. (Patient not taking: Reported on 12/12/2014) 30 tablet 1     No Known Allergies  ROS: Constitutional:  No fevers, chills, sweats.  Weight stable Eyes:  No vision changes, No discharge HENT:  No sore throats, nasal drainage Lymph: No neck swelling, No bruising easily Pulmonary:  No cough, productive sputum CV: No orthopnea, PND  No exertional chest/neck/shoulder/arm pain. GI:  No personal nor family history of inflammatory bowel disease, irritable bowel syndrome, allergy such as Celiac Sprue, dietary/dairy problems,  colitis, ulcers nor gastritis.  No recent sick contacts/gastroenteritis.  No travel outside the country.  No changes in diet. Father had colon cancer Renal: No UTIs, No hematuria Genital:  No drainage, bleeding, masses Musculoskeletal: No severe joint pain.  Good ROM major joints Skin:  No sores or lesions.  No rashes Heme/Lymph:  No easy bleeding.  No swollen lymph nodes Neuro: No focal weakness/numbness.  No seizures Psych: No suicidal ideation.  No hallucinations  BP 117/74 mmHg  Pulse 95  Temp(Src) 98.1 F (36.7 C) (Oral)  Resp 24  SpO2 100%  Physical Exam: General: Pt awake/alert/oriented x4 in mild/moderate acute distress Eyes: PERRL, normal EOM. Sclera jaundiced Neuro: CN II-XII intact w/o focal sensory/motor deficits. Lymph: No head/neck/groin lymphadenopathy Psych:  No delerium/psychosis/paranoia HENT: Normocephalic, Mucus membranes moist.  No thrush Neck: Supple, No tracheal deviation Chest: No pain.  Good respiratory excursion. CV:  Pulses intact.  Regular rhythm.  HR 110s Abdomen: Firm  Mildly distended.  Tenderness to palpation in epigastric region with guarding and suspicious peritonitis.  No major pain and right upper quadrant.  No Murphy sign.  No diastases.  No umbilical hernia. Genital: Normal external male genitalia.  Large right inguinal hernia down to scrotum with soft small bowel.  Reducible.  No definite left Ing hernia.  Epididymis and testes normal.  No meatal blood Ext:  SCDs BLE.  No significant edema.  No cyanosis Skin: No petechiae / purpurea.  No major sores Musculoskeletal: No severe joint pain.  Good ROM major joints   Results:   Labs: Results for orders placed or performed during the hospital encounter of 12/12/14 (from the past 48 hour(s))  CBC with Differential/Platelet     Status: Abnormal   Collection Time: 12/12/14  4:21 PM  Result Value Ref Range   WBC 3.2 (L) 4.0 - 10.5 K/uL   RBC 4.30 4.22 - 5.81 MIL/uL   Hemoglobin 13.2 13.0 - 17.0  g/dL   HCT 37.6 (L) 39.0 - 52.0 %   MCV 87.4 78.0 - 100.0 fL   MCH 30.7 26.0 - 34.0 pg   MCHC 35.1 30.0 - 36.0 g/dL   RDW 15.0 11.5 - 15.5 %   Platelets 117 (L) 150 - 400 K/uL    Comment: SPECIMEN CHECKED FOR CLOTS REPEATED TO VERIFY PLATELET COUNT CONFIRMED BY SMEAR    Neutrophils Relative % 73 43 - 77 %   Lymphocytes Relative 23 12 - 46 %   Monocytes Relative 3 3 - 12 %   Eosinophils Relative 0 0 - 5 %   Basophils Relative 1 0 - 1 %   Neutro Abs 2.4 1.7 - 7.7 K/uL   Lymphs Abs 0.7 0.7 - 4.0 K/uL   Monocytes Absolute 0.1 0.1 - 1.0 K/uL   Eosinophils Absolute 0.0 0.0 - 0.7 K/uL   Basophils Absolute 0.0 0.0 - 0.1 K/uL   RBC Morphology TARGET CELLS   Comprehensive metabolic panel     Status:  Abnormal   Collection Time: 12-26-14  4:21 PM  Result Value Ref Range   Sodium 131 (L) 135 - 145 mmol/L   Potassium 3.6 3.5 - 5.1 mmol/L   Chloride 101 101 - 111 mmol/L   CO2 19 (L) 22 - 32 mmol/L   Glucose, Bld 87 65 - 99 mg/dL   BUN 8 6 - 20 mg/dL   Creatinine, Ser 0.64 0.61 - 1.24 mg/dL   Calcium 8.2 (L) 8.9 - 10.3 mg/dL   Total Protein 6.4 (L) 6.5 - 8.1 g/dL   Albumin 3.0 (L) 3.5 - 5.0 g/dL   AST 396 (H) 15 - 41 U/L   ALT 241 (H) 17 - 63 U/L   Alkaline Phosphatase 132 (H) 38 - 126 U/L   Total Bilirubin 3.2 (H) 0.3 - 1.2 mg/dL   GFR calc non Af Amer >60 >60 mL/min   GFR calc Af Amer >60 >60 mL/min    Comment: (NOTE) The eGFR has been calculated using the CKD EPI equation. This calculation has not been validated in all clinical situations. eGFR's persistently <60 mL/min signify possible Chronic Kidney Disease.    Anion gap 11 5 - 15  Lipase, blood     Status: None   Collection Time: 12/26/2014  4:21 PM  Result Value Ref Range   Lipase 36 22 - 51 U/L  Ethanol     Status: Abnormal   Collection Time: December 26, 2014  4:21 PM  Result Value Ref Range   Alcohol, Ethyl (B) 90 (H) <5 mg/dL    Comment:        LOWEST DETECTABLE LIMIT FOR SERUM ALCOHOL IS 5 mg/dL FOR MEDICAL PURPOSES ONLY    I-Stat CG4 Lactic Acid, ED     Status: Abnormal   Collection Time: 26-Dec-2014  4:29 PM  Result Value Ref Range   Lactic Acid, Venous 2.55 (HH) 0.5 - 2.0 mmol/L   Comment NOTIFIED PHYSICIAN     Imaging / Studies: US Abdomen Limited Ruq  12-26-14   CLINICAL DATA:  Acute onset of right upper quadrant abdominal pain. Initial encounter.  EXAM: US ABDOMEN LIMITED - RIGHT UPPER QUADRANT  COMPARISON:  CT of the abdomen and pelvis performed 09/04/2013  FINDINGS: Gallbladder:  Mild gallbladder wall thickening is noted. This may reflect mild chronic inflammation. No stones are identified. No pericholecystic fluid is seen. No ultrasonographic Murphy's sign is elicited.  Common bile duct:  Diameter: 0.3 cm, within normal limits in caliber.  Liver:  No focal lesion identified. Diffusely nodular contour raises concern for hepatic cirrhosis, better characterized than on the prior study. Mildly increased parenchymal echogenicity could reflect mild fatty infiltration.  IMPRESSION: 1. Mild gallbladder wall thickening may reflect mild chronic inflammation. No stones seen. No evidence for obstruction or acute cholecystitis. 2. Diffusely nodular hepatic contour raises concern for hepatic cirrhosis, better characterized than on the prior study. Mild fatty infiltration within the liver.   Electronically Signed   By: Garald Balding M.D.   On: 2014/12/26 18:23    Medications / Allergies: per chart  Antibiotics: Anti-infectives    None      Assessment  Alfred [name]  64 y.o. male       Problem List:  Principal Problem:   Pneumoperitoneum Active Problems:   Protein-calorie malnutrition, moderate   Alcohol abuse   Cirrhosis, alcoholic   Tobacco abuse   Abdominal pain.  Not consistent with biliary colic or cholecystitis.  Pneumoperitoneum.  Most and upper abdomen with epigastric pain.  Suspicious for  perforated ulcer given history of alcohol, tobacco, non-steroidal use.  Other possibilities include  diverticular perforation or other bowel perforation  Jaundice and elevated liver function test.  Suspicious for cirrhosis on ultrasound and possibly on CT scan.  Not obviously severe  Plan:  My initial examination did not seem consistent with cholecystitis.  He had some mild gallbladder wall thickening but in the setting of probable cirrhosis, no gallbladder is normal.  Initially ordered a HIDA scan until the x-rays came back.  I had to help  my partner do emergency surgery at the other hospital.  Xrays with diagnosis of free air.  CT scan suspicious in the upper abdomen.  Reviewed with radiology.  They hesitate to make a source Dx, but to me he has chronically inflamed antrum suspicious for chronic ulcer that is probably now perforated.  I only see a few areas of gas on the descending colon.  Nothing down in the pelvis or sigmoid colon that would argue towards a perforated diverticulitis .  He possibly could have a descending colon diverticulitis but that is atypical and usually is in the sigmoid.  No definite obvious bulky tumor although limited since not a fully contrasted CT in the interest of time.  I strongly recommend the patient get up abdominal exploration.  We will try and do diagnostic laparoscopy with a laparoscopic omental Phillip Heal patch if the ulcer truly is found.  Low threshold to convert to open if he is enough gland stable or diagnosis cannot be easily obtained.  Her biopsy.  Possible cholecystectomy.  Possible bowel resection.  The anatomy & physiology of the digestive tract was discussed.  The pathophysiology of perforation was discussed.  Differential diagnosis such as perforated ulcer or colon, etc was discussed.   Natural history risks without surgery such as death was discussed.  I recommended abdominal exploration to diagnose & treat the source of the problem.  Laparoscopic & open techniques were discussed.   Risks such as bleeding, infection, abscess, leak, reoperation, bowel  resection, possible ostomy, hernia, heart attack, death, and other risks were discussed.   The risks of no intervention will lead to serious problems including death.   I expressed a good likelihood that surgery will address the problem.    Goals of post-operative recovery were discussed as well.  We will work to minimize complications although risks in an emergent setting are high.   Questions were answered.  The patient expressed understanding & wishes to proceed with surgery.       Most likely he will need an ICU bed postop.  Low threshold to have critical care involved.  Woodlike medicines input given the probable circumflex cirrhosis or at least chronic hepatitis.  I again strongly recommend the patient he quit smoking and stop drinking as he is shortening his life spanning and causing further problems.  Proton pump inhibitor.  IV antibiotics.  NGT   Watch out for alcohol withdrawal.  -VTE prophylaxis- SCDs, etc -mobilize as tolerated to help recovery    Adin Hector, M.D., F.A.C.S. Gastrointestinal and Minimally Invasive Surgery [facility] [address], [place], [place] [address] [phone] Main / Paging   12/12/2014  Note: Portions of this report may have been transcribed using voice recognition software. Every effort was made to ensure accuracy; however, inadvertent computerized transcription errors may be present.   Any transcriptional errors that result from this process are unintentional.

## 2014-12-12 NOTE — ED Notes (Signed)
Pt. On monitor. 

## 2014-12-12 NOTE — ED Provider Notes (Addendum)
CSN: [id number]     Arrival date & time 12/12/14  1542 History   First MD Initiated Contact with Patient 12/12/14 1552     Chief Complaint  Patient presents with  . Abdominal Pain     (Consider location/radiation/quality/duration/timing/severity/associated sxs/prior Treatment) Patient is a 64 y.o. male presenting with abdominal pain. The history is provided by the patient. No language interpreter was used.  Abdominal Pain Pain location:  RUQ Pain quality: aching   Pain radiates to:  Does not radiate Pain severity:  Severe Onset quality:  Gradual Duration:  2 days Timing:  Constant Progression:  Worsening Chronicity:  New Context: alcohol use   Relieved by:  Nothing Worsened by:  Nothing tried Ineffective treatments:  None tried Associated symptoms: anorexia, cough and shortness of breath   Associated symptoms: no chest pain, no constipation, no diarrhea, no dysuria, no fatigue, no fever, no nausea and no vomiting   Risk factors: has not had multiple surgeries, no NSAID use and not obese     Past Medical History  Diagnosis Date  . Delirium tremens 09/10/2013  . Dental abscess 09/10/2013  . Knee effusion, left 09/10/2013  . Mental status change 09/10/2013   History reviewed. No pertinent past surgical history. History reviewed. No pertinent family history. History  Substance Use Topics  . Smoking status: Current Every Day Smoker -- 1.00 packs/day    Types: Cigarettes  . Smokeless tobacco: Not on file  . Alcohol Use: Yes     Comment: 6 pack a day/heavy    Review of Systems  Constitutional: Negative for fever, activity change, appetite change and fatigue.  HENT: Negative for congestion, facial swelling, rhinorrhea and trouble swallowing.   Eyes: Negative for photophobia and pain.  Respiratory: Positive for cough and shortness of breath. Negative for chest tightness.   Cardiovascular: Negative for chest pain and leg swelling.  Gastrointestinal: Positive for abdominal pain and  anorexia. Negative for nausea, vomiting, diarrhea and constipation.  Endocrine: Negative for polydipsia and polyuria.  Genitourinary: Negative for dysuria, urgency, decreased urine volume and difficulty urinating.  Musculoskeletal: Negative for back pain and gait problem.  Skin: Negative for color change, rash and wound.  Allergic/Immunologic: Negative for immunocompromised state.  Neurological: Negative for dizziness, facial asymmetry, speech difficulty, weakness, numbness and headaches.  Psychiatric/Behavioral: Negative for confusion, decreased concentration and agitation.      Allergies  Review of patient's allergies indicates no known allergies.  Home Medications   Prior to Admission medications   Medication Sig Start Date End Date Taking? Authorizing Provider  ibuprofen (ADVIL,MOTRIN) 200 MG tablet Take 800 mg by mouth every 6 (six) hours as needed for headache, mild pain or moderate pain.   Yes Historical Provider, MD  ibuprofen (ADVIL,MOTRIN) 600 MG tablet Take 1 tablet (600 mg total) by mouth 3 (three) times daily as needed for headache. Patient not taking: Reported on 12/12/2014 09/10/13   Clydia Llano, MD  thiamine 100 MG tablet Take 1 tablet (100 mg total) by mouth daily. Patient not taking: Reported on 12/12/2014 09/10/13   Clydia Llano, MD   BP 132/90 mmHg  Pulse 108  Temp(Src) 98.1 F (36.7 C) (Oral)  Resp 20  SpO2 100% Physical Exam  Constitutional: He is oriented to person, place, and time. He appears well-developed and well-nourished. No distress.  HENT:  Head: Normocephalic and atraumatic.  Mouth/Throat: No oropharyngeal exudate.  Eyes: Pupils are equal, round, and reactive to light.  Neck: Normal range of motion. Neck supple.  Cardiovascular: Normal rate,  regular rhythm and normal heart sounds.  Exam reveals no gallop and no friction rub.   No murmur heard. Pulmonary/Chest: Effort normal and breath sounds normal. No respiratory distress. He has no wheezes. He has no  rales.  Abdominal: Soft. Bowel sounds are normal. He exhibits no distension and no mass. There is tenderness in the right upper quadrant. There is guarding. There is no rigidity and no rebound.  Generalized tenderness, worse in the right upper quadrant  Genitourinary:     Musculoskeletal: Normal range of motion. He exhibits no edema or tenderness.  Neurological: He is alert and oriented to person, place, and time.  Skin: Skin is warm and dry.  Psychiatric: He has a normal mood and affect.    ED Course  Procedures (including critical care time) Labs Review Labs Reviewed  CBC WITH DIFFERENTIAL/PLATELET - Abnormal; Notable for the following:    WBC 3.2 (*)    HCT 37.6 (*)    Platelets 117 (*)    All other components within normal limits  COMPREHENSIVE METABOLIC PANEL - Abnormal; Notable for the following:    Sodium 131 (*)    CO2 19 (*)    Calcium 8.2 (*)    Total Protein 6.4 (*)    Albumin 3.0 (*)    AST 396 (*)    ALT 241 (*)    Alkaline Phosphatase 132 (*)    Total Bilirubin 3.2 (*)    All other components within normal limits  ETHANOL - Abnormal; Notable for the following:    Alcohol, Ethyl (B) 90 (*)    All other components within normal limits  I-STAT CG4 LACTIC ACID, ED - Abnormal; Notable for the following:    Lactic Acid, Venous 2.55 (*)    All other components within normal limits  LIPASE, BLOOD  PROTIME-INR  AMMONIA  COMPREHENSIVE METABOLIC PANEL  LIPASE, BLOOD  CBC    Imaging Review Ct Abdomen Pelvis W Contrast  12/12/2014   CLINICAL DATA:  Acute onset of generalized abdominal pain. Rebound tenderness at the right upper quadrant. Initial encounter.  EXAM: CT ABDOMEN AND PELVIS WITH CONTRAST  TECHNIQUE: Multidetector CT imaging of the abdomen and pelvis was performed using the standard protocol following bolus administration of intravenous contrast.  CONTRAST:  OMNIPAQUE IOHEXOL 300 MG/ML  SOLN  COMPARISON:  CT of the abdomen and pelvis performed  09/04/2013, and right upper quadrant ultrasound performed earlier today at 5:15 p.m.  FINDINGS: The visualized lung bases are clear.  A moderate to large amount of free air and free fluid are noted within the abdomen and pelvis. Soft tissue inflammation is noted tracking along both paracolic gutters. This is compatible with underlying bowel perforation. The location of perforation is not well characterized on this study.  Fluid is noted tracking about the gallbladder, with surrounding soft tissue inflammation. This could reflect mild cholecystitis, or could be reactive secondary to underlying bowel perforation and free fluid.  The liver and spleen are unremarkable in appearance. Scattered air is noted tracking about the liver. The pancreas and adrenal glands are unremarkable.  The kidneys are unremarkable in appearance. There is no evidence of hydronephrosis. No renal or ureteral stones are seen. No perinephric stranding is appreciated. Minimal calcification is noted at the distal abdominal aorta and its branches.  A moderate to large right inguinal hernia is seen, containing a long segment of distal ileum. No associated soft tissue inflammation is seen to suggest strangulation. Trace free air is noted within the hernia.  There is focal wall thickening at the antrum of the stomach. This appears grossly stable from 2015 and may reflect chronic inflammation. No acute vascular abnormalities are seen.  The appendix is not well seen; there is no evidence of appendicitis. The colon is largely decompressed and is grossly unremarkable in appearance.  The bladder is mildly distended and grossly unremarkable. The prostate is normal in size. No inguinal lymphadenopathy is seen.  No acute osseous abnormalities are identified.  IMPRESSION: 1. Moderate to large amount of free air and free fluid within the abdomen and pelvis, tracking along the paracolic gutters. This is compatible with underlying bowel perforation. The location  of perforation is not well characterized on this study. 2. Fluid tracking about the gallbladder, with surrounding soft tissue inflammation. This could reflect mild cholecystitis, or could be reactive secondary to underlying bowel perforation and free fluid. 3. Moderate to large right inguinal hernia, containing a long segment of distal ileum. No associated soft tissue inflammation seen to suggest strangulation. Trace free air noted within the hernia. 4. Focal wall thickening at the antrum of the stomach appears grossly stable from 2015 and may reflect chronic inflammation. Would correlate with the patient's symptoms; endoscopy could be considered on an elective nonemergent basis.  Critical Value/emergent results were called by telephone at the time of interpretation on 12/12/2014 at 9:24 pm to Dr. Toy Cookey, who verbally acknowledged these results.   Electronically Signed   By: Roanna Raider M.D.   On: 12/12/2014 21:57   Dg Abd Acute W/chest  12/12/2014   CLINICAL DATA:  Two day history of abdominal pain increasing in severity over the past 2 hr.  EXAM: DG ABDOMEN ACUTE W/ 1V CHEST  COMPARISON:  Chest x-ray 09/04/2013  FINDINGS: The upright chest x-ray demonstrates normal cardiomediastinal contours. No acute pulmonary findings. Minimal streaky basilar atelectasis. There is moderate free air noted in the abdomen.  Two views of the abdomen confirm large amount of free intraperitoneal air consistent with hollow viscus perforation. The soft tissue shadows of the abdomen are maintained. No worrisome calcifications. The bony structures are intact.  IMPRESSION: Free intraperitoneal air is noted, consistent with hollow viscus perforation.  These results were called by telephone at the time of interpretation on 12/12/2014 at 8:16 pm to Dr. Toy Cookey , who verbally acknowledged these results.   Electronically Signed   By: Rudie Meyer M.D.   On: 12/12/2014 20:16   US Abdomen Limited Ruq  12/12/2014   CLINICAL  DATA:  Acute onset of right upper quadrant abdominal pain. Initial encounter.  EXAM: US ABDOMEN LIMITED - RIGHT UPPER QUADRANT  COMPARISON:  CT of the abdomen and pelvis performed 09/04/2013  FINDINGS: Gallbladder:  Mild gallbladder wall thickening is noted. This may reflect mild chronic inflammation. No stones are identified. No pericholecystic fluid is seen. No ultrasonographic Murphy's sign is elicited.  Common bile duct:  Diameter: 0.3 cm, within normal limits in caliber.  Liver:  No focal lesion identified. Diffusely nodular contour raises concern for hepatic cirrhosis, better characterized than on the prior study. Mildly increased parenchymal echogenicity could reflect mild fatty infiltration.  IMPRESSION: 1. Mild gallbladder wall thickening may reflect mild chronic inflammation. No stones seen. No evidence for obstruction or acute cholecystitis. 2. Diffusely nodular hepatic contour raises concern for hepatic cirrhosis, better characterized than on the prior study. Mild fatty infiltration within the liver.   Electronically Signed   By: Roanna Raider M.D.   On: 12/12/2014 18:23     EKG  Interpretation None     CRITICAL CARE Performed by: Toy CookeyHERTY, MEGAN, E Total critical care time: 120 mins  Critical care time was exclusive of separately billable procedures and treating other patients. Critical care was necessary to treat or prevent imminent or life-threatening deterioration. Critical care was time spent personally by me on the following activities: development of treatment plan with patient and/or surrogate as well as nursing, discussions with consultants, evaluation of patient's response to treatment, examination of patient, obtaining history from patient or surrogate, ordering and performing treatments and interventions, ordering and review of laboratory studies, ordering and review of radiographic studies, pulse oximetry and re-evaluation of patient's condition.  MDM   Final diagnoses:  RUQ  pain  Upper abdominal pain  Pneumoperitoneum  Alcoholic cirrhosis of liver without ascites  Abnormal LFTs  Hyperbilirubinemia  Elevated lactic acid level   Pt is a 64 y.o. male with Pmhx as above who presents with 2 days of gradual onset worsening RUQ pain.  He reports pain is worse with coughing and deep breathing.  He is also had some cough and shortness of breath.  He denies fevers, nausea, vomiting or diarrhea.  He's had some chills.  He reports drinking at least a sixpack of beer and some liquor daily for greater than 20 years.  No prior history of abdominal surgeries.  On physical exam, patient is uncomfortable but in no acute distress.  He is generalized abdominal tenderness which is worse in the right upper quadrant with guarding and a positive Murphy sign.  18:40 PM Labs showing elevated lactate 2.5 elevated LFTs, alkaline phosphatase and T bili.  His R upper quadrant ultrasound shows mild gallbladder wall thickening which radiology reports may be chronic, however, clinically findings are more consistent with an acute process.  I have suspicion for acute cholecystitis.  General surgery has been consulted.    Dr. Michaell CowingGross has seen pt, feels his pain is primarily epigastric. Does not feel symptoms represent acute GB pathology but will follow and get HIDA scan as inpt. Recommends additional abdominal imaging. Given timing for contrasted CT, will start with AAS, if nml will get CT.   8:16 PM AAS with free air. Pt given IV zosyn, IV protonix, IVF bolus, will reconsult [facility]. HR 105, BP remains stable and pt feel improved.   845PM Dr. Michaell CowingGross recommending CT imaging and for [facility] to be involved. [facility] MD does not feel they have an acute role at this point as pt has an acute surgical issue.     9:30 PM Spoke with radiologist about CT imaging, which shows abdominal perforation, without a clear source.  He has some chronic appearing scarring in his stomach which appears to been present in 2005.  He also  has some pericholecystic fluid.  10:23 PM Dr. Michaell CowingGross.  General surgery has been down again to see the patient and will take him to the OR for an exploratory laparoscopy.  At time of his exam, patient had been unhooked from suction and the monitor.  I spoke with patient's nurse about this.  He reported to me that he had just turned him off suction and off the monitor to get him cleaned up to go to the OR.  On my repeat exams, patient has been on the cardiac monitor the entire ED visit and has also been hooked up to low intermittent suction for his NG tube, including approximately 5-10 minutes prior to Dr. Gordy SaversGross's reexamination.   IV fluid bolus and infusion started.  His blood  pressure and heart rate remains stable at this point.      Toy Cookey, MD 12/12/14 1610  Toy Cookey, MD 12/12/14 2258

## 2014-12-12 NOTE — ED Notes (Signed)
Bed: WA09 Expected date:  Expected time:  Means of arrival:  Comments: abd pain 

## 2014-12-12 NOTE — ED Notes (Signed)
PER EMS- pt picked up from home c/o abd pain x2 days. Reported pain increased x2hours ago severely.  Reports 10/10 pain, rebound tenderness in RUQ. Guarding and pain in all quadrants. Denies chest pain.  12-lead unremarkable.  PT drinks alcohol daily.  PTA given fentanyl IV and 500NS.  Arrived to ED alert and oriented per baseline, x4. Denies n/v. Pt also c/o intermittent diaphoresis.

## 2014-12-12 NOTE — Anesthesia Procedure Notes (Signed)
Procedure Name: Intubation Date/Time: 12/12/2014 11:21 PM Performed by: Enriqueta ShutterWILLIFORD, Clydette Privitera D Pre-anesthesia Checklist: Patient identified, Emergency Drugs available, Suction available and Patient being monitored Patient Re-evaluated:Patient Re-evaluated prior to inductionOxygen Delivery Method: Circle System Utilized Preoxygenation: Pre-oxygenation with 100% oxygen Intubation Type: IV induction, Rapid sequence and Cricoid Pressure applied Tube type: Oral Tube size: 7.5 mm Number of attempts: 1 Airway Equipment and Method: Stylet and Oral airway Placement Confirmation: ETT inserted through vocal cords under direct vision,  positive ETCO2 and breath sounds checked- equal and bilateral Tube secured with: Tape Dental Injury: Teeth and Oropharynx as per pre-operative assessment

## 2014-12-12 NOTE — ED Notes (Signed)
Pt's personal belongings placed in one white bag---- kept in TCU locker # [id number].

## 2014-12-13 DIAGNOSIS — F17210 Nicotine dependence, cigarettes, uncomplicated: Secondary | ICD-10-CM | POA: Diagnosis present

## 2014-12-13 DIAGNOSIS — M2342 Loose body in knee, left knee: Secondary | ICD-10-CM | POA: Diagnosis present

## 2014-12-13 DIAGNOSIS — F10231 Alcohol dependence with withdrawal delirium: Secondary | ICD-10-CM | POA: Diagnosis not present

## 2014-12-13 DIAGNOSIS — Z23 Encounter for immunization: Secondary | ICD-10-CM | POA: Diagnosis not present

## 2014-12-13 DIAGNOSIS — Z800 Family history of malignant neoplasm of digestive organs: Secondary | ICD-10-CM | POA: Diagnosis not present

## 2014-12-13 DIAGNOSIS — Z720 Tobacco use: Secondary | ICD-10-CM | POA: Diagnosis not present

## 2014-12-13 DIAGNOSIS — B1920 Unspecified viral hepatitis C without hepatic coma: Secondary | ICD-10-CM | POA: Diagnosis present

## 2014-12-13 DIAGNOSIS — K4090 Unilateral inguinal hernia, without obstruction or gangrene, not specified as recurrent: Secondary | ICD-10-CM | POA: Diagnosis present

## 2014-12-13 DIAGNOSIS — Z681 Body mass index (BMI) 19 or less, adult: Secondary | ICD-10-CM | POA: Diagnosis not present

## 2014-12-13 DIAGNOSIS — F1020 Alcohol dependence, uncomplicated: Secondary | ICD-10-CM | POA: Diagnosis present

## 2014-12-13 DIAGNOSIS — K7031 Alcoholic cirrhosis of liver with ascites: Secondary | ICD-10-CM | POA: Diagnosis present

## 2014-12-13 DIAGNOSIS — F1010 Alcohol abuse, uncomplicated: Secondary | ICD-10-CM | POA: Diagnosis not present

## 2014-12-13 DIAGNOSIS — K7030 Alcoholic cirrhosis of liver without ascites: Secondary | ICD-10-CM | POA: Diagnosis not present

## 2014-12-13 DIAGNOSIS — R1010 Upper abdominal pain, unspecified: Secondary | ICD-10-CM | POA: Diagnosis present

## 2014-12-13 DIAGNOSIS — M25462 Effusion, left knee: Secondary | ICD-10-CM | POA: Diagnosis not present

## 2014-12-13 DIAGNOSIS — K7581 Nonalcoholic steatohepatitis (NASH): Secondary | ICD-10-CM | POA: Diagnosis present

## 2014-12-13 DIAGNOSIS — Z791 Long term (current) use of non-steroidal anti-inflammatories (NSAID): Secondary | ICD-10-CM | POA: Diagnosis not present

## 2014-12-13 DIAGNOSIS — R7989 Other specified abnormal findings of blood chemistry: Secondary | ICD-10-CM | POA: Diagnosis not present

## 2014-12-13 DIAGNOSIS — E440 Moderate protein-calorie malnutrition: Secondary | ICD-10-CM | POA: Diagnosis not present

## 2014-12-13 DIAGNOSIS — D649 Anemia, unspecified: Secondary | ICD-10-CM | POA: Diagnosis present

## 2014-12-13 DIAGNOSIS — M1712 Unilateral primary osteoarthritis, left knee: Secondary | ICD-10-CM | POA: Diagnosis present

## 2014-12-13 DIAGNOSIS — K567 Ileus, unspecified: Secondary | ICD-10-CM | POA: Diagnosis not present

## 2014-12-13 DIAGNOSIS — K650 Generalized (acute) peritonitis: Secondary | ICD-10-CM | POA: Diagnosis present

## 2014-12-13 DIAGNOSIS — Y838 Other surgical procedures as the cause of abnormal reaction of the patient, or of later complication, without mention of misadventure at the time of the procedure: Secondary | ICD-10-CM | POA: Diagnosis not present

## 2014-12-13 DIAGNOSIS — T85638A Leakage of other specified internal prosthetic devices, implants and grafts, initial encounter: Secondary | ICD-10-CM | POA: Diagnosis not present

## 2014-12-13 DIAGNOSIS — K265 Chronic or unspecified duodenal ulcer with perforation: Secondary | ICD-10-CM | POA: Diagnosis present

## 2014-12-13 DIAGNOSIS — D696 Thrombocytopenia, unspecified: Secondary | ICD-10-CM | POA: Diagnosis not present

## 2014-12-13 DIAGNOSIS — E162 Hypoglycemia, unspecified: Secondary | ICD-10-CM | POA: Diagnosis not present

## 2014-12-13 DIAGNOSIS — E876 Hypokalemia: Secondary | ICD-10-CM | POA: Diagnosis not present

## 2014-12-13 DIAGNOSIS — R030 Elevated blood-pressure reading, without diagnosis of hypertension: Secondary | ICD-10-CM | POA: Diagnosis not present

## 2014-12-13 LAB — COMPREHENSIVE METABOLIC PANEL
ALT: 208 U/L — ABNORMAL HIGH (ref 17–63)
ANION GAP: 7 (ref 5–15)
AST: 331 U/L — ABNORMAL HIGH (ref 15–41)
Albumin: 2.7 g/dL — ABNORMAL LOW (ref 3.5–5.0)
Alkaline Phosphatase: 93 U/L (ref 38–126)
BUN: 13 mg/dL (ref 6–20)
CHLORIDE: 108 mmol/L (ref 101–111)
CO2: 20 mmol/L — AB (ref 22–32)
CREATININE: 0.95 mg/dL (ref 0.61–1.24)
Calcium: 7.2 mg/dL — ABNORMAL LOW (ref 8.9–10.3)
GFR calc Af Amer: >60 mL/min (ref >60)
GFR calc non Af Amer: >60 mL/min (ref >60)
GLUCOSE: 105 mg/dL — AB (ref 65–99)
POTASSIUM: 4.9 mmol/L (ref 3.5–5.1)
Sodium: 135 mmol/L (ref 135–145)
Total Bilirubin: 4.4 mg/dL — ABNORMAL HIGH (ref 0.3–1.2)
Total Protein: 6 g/dL — ABNORMAL LOW (ref 6.5–8.1)

## 2014-12-13 LAB — MRSA PCR SCREENING: MRSA by PCR: NEGATIVE

## 2014-12-13 LAB — CBC
HEMATOCRIT: 38.5 % — AB (ref 39.0–52.0)
HEMOGLOBIN: 12.8 g/dL — AB (ref 13.0–17.0)
MCH: 30 pg (ref 26.0–34.0)
MCHC: 33.2 g/dL (ref 30.0–36.0)
MCV: 90.4 fL (ref 78.0–100.0)
PLATELETS: 90 10*3/uL — AB (ref 150–400)
RBC: 4.26 MIL/uL (ref 4.22–5.81)
RDW: 15.3 % (ref 11.5–15.5)
WBC: 10.7 10*3/uL — ABNORMAL HIGH (ref 4.0–10.5)

## 2014-12-13 LAB — LIPASE, BLOOD: Lipase: 50 U/L (ref 22–51)

## 2014-12-13 MED ORDER — PROMETHAZINE HCL 25 MG/ML IJ SOLN: 6.2500 mg | INTRAMUSCULAR | Status: DC | PRN

## 2014-12-13 MED ORDER — THIAMINE HCL 100 MG/ML IJ SOLN
100.0000 mg | Freq: Every day | INTRAMUSCULAR | Status: DC
  Administered 2014-12-13 – 2014-12-23 (×11): 100 mg via INTRAVENOUS
  Filled 2014-12-13 (×5): qty 1
  Filled 2014-12-13: qty 2
  Filled 2014-12-13 (×2): qty 1
  Filled 2014-12-13: qty 2
  Filled 2014-12-13 (×2): qty 1
  Filled 2014-12-13: qty 2
  Filled 2014-12-13 (×2): qty 1

## 2014-12-13 MED ORDER — VITAMIN B-1 100 MG PO TABS
100.0000 mg | ORAL_TABLET | Freq: Every day | ORAL | Status: DC
  Administered 2014-12-24 – 2014-12-26 (×3): 100 mg via ORAL
  Filled 2014-12-13 (×11): qty 1

## 2014-12-13 MED ORDER — GLYCOPYRROLATE 0.2 MG/ML IJ SOLN
INTRAMUSCULAR | Status: DC | PRN
  Administered 2014-12-13: 0.6 mg via INTRAVENOUS

## 2014-12-13 MED ORDER — SODIUM CHLORIDE 0.9 % IR SOLN
Status: DC | PRN
  Administered 2014-12-13: 1000 mL

## 2014-12-13 MED ORDER — BUPIVACAINE-EPINEPHRINE 0.25% -1:200000 IJ SOLN
INTRAMUSCULAR | Status: DC | PRN
  Administered 2014-12-13: 50 mL

## 2014-12-13 MED ORDER — SODIUM CHLORIDE 0.9 % IR SOLN
Status: DC | PRN
  Administered 2014-12-13: 3000 mL

## 2014-12-13 MED ORDER — SODIUM CHLORIDE 0.9 % IV SOLN
80.0000 mg | Freq: Two times a day (BID) | INTRAVENOUS | Status: DC
  Administered 2014-12-13 – 2014-12-17 (×10): 80 mg via INTRAVENOUS
  Filled 2014-12-13 (×16): qty 80

## 2014-12-13 MED ORDER — GLYCOPYRROLATE 0.2 MG/ML IJ SOLN
INTRAMUSCULAR | Status: AC
Start: 2014-12-13 — End: 2014-12-13
  Filled 2014-12-13: qty 3

## 2014-12-13 MED ORDER — CETYLPYRIDINIUM CHLORIDE 0.05 % MT LIQD
7.0000 mL | Freq: Two times a day (BID) | OROMUCOSAL | Status: DC
  Administered 2014-12-14 – 2014-12-25 (×24): 7 mL via OROMUCOSAL

## 2014-12-13 MED ORDER — NEOSTIGMINE METHYLSULFATE 10 MG/10ML IV SOLN
INTRAVENOUS | Status: DC | PRN
  Administered 2014-12-13: 4 mg via INTRAVENOUS

## 2014-12-13 MED ORDER — HYDROMORPHONE HCL 1 MG/ML IJ SOLN: 0.2500 mg | INTRAMUSCULAR | Status: DC | PRN

## 2014-12-13 MED ORDER — PANTOPRAZOLE SODIUM 40 MG PO TBEC: 40.0000 mg | DELAYED_RELEASE_TABLET | Freq: Two times a day (BID) | ORAL | Status: DC

## 2014-12-13 MED ORDER — LACTATED RINGERS IV SOLN
INTRAVENOUS | Status: DC
Start: 2014-12-13 — End: 2014-12-13

## 2014-12-13 MED ORDER — ONDANSETRON HCL 4 MG/2ML IJ SOLN
INTRAMUSCULAR | Status: DC | PRN
  Administered 2014-12-13: 4 mg via INTRAVENOUS

## 2014-12-13 MED ORDER — PIPERACILLIN-TAZOBACTAM 3.375 G IVPB
3.3750 g | Freq: Three times a day (TID) | INTRAVENOUS | Status: AC
  Administered 2014-12-13 – 2014-12-20 (×21): 3.375 g via INTRAVENOUS
  Filled 2014-12-13 (×21): qty 50

## 2014-12-13 MED ORDER — LORAZEPAM 2 MG/ML IJ SOLN
0.0000 mg | Freq: Two times a day (BID) | INTRAMUSCULAR | Status: AC
  Administered 2014-12-15 – 2014-12-16 (×2): 1 mg via INTRAVENOUS
  Filled 2014-12-13 (×2): qty 1

## 2014-12-13 MED ORDER — LACTATED RINGERS IV SOLN
INTRAVENOUS | Status: DC
  Administered 2014-12-13 – 2014-12-15 (×5): via INTRAVENOUS
  Administered 2014-12-16: 100 mL/h via INTRAVENOUS

## 2014-12-13 MED ORDER — PHENYLEPHRINE 40 MCG/ML (10ML) SYRINGE FOR IV PUSH (FOR BLOOD PRESSURE SUPPORT)
PREFILLED_SYRINGE | INTRAVENOUS | Status: AC
  Filled 2014-12-13: qty 10

## 2014-12-13 MED ORDER — LACTATED RINGERS IR SOLN
Status: DC | PRN
  Administered 2014-12-13: 3000 mL

## 2014-12-13 MED ORDER — HEPARIN SODIUM (PORCINE) 5000 UNIT/ML IJ SOLN
5000.0000 [IU] | Freq: Three times a day (TID) | INTRAMUSCULAR | Status: DC
  Administered 2014-12-14 – 2014-12-26 (×36): 5000 [IU] via SUBCUTANEOUS
  Filled 2014-12-13 (×42): qty 1

## 2014-12-13 MED ORDER — NEOSTIGMINE METHYLSULFATE 10 MG/10ML IV SOLN
INTRAVENOUS | Status: AC
Start: 2014-12-13 — End: 2014-12-13
  Filled 2014-12-13: qty 1

## 2014-12-13 NOTE — Progress Notes (Addendum)
[facility]  [address] [place], [place] [address] Phone: [phone] FAX: [phone]   Alfred [name] [id number] [birth year]   Problem List:   Principal Problem:   Perforated duodenal bulb ulcer s/p lap omental patch 12/13/2014 Active Problems:   Protein-calorie malnutrition, moderate   Alcohol abuse   Cirrhosis, alcoholic   Tobacco abuse   Colonoscopy refused   Inguinal hernia, right   1 Day Post-Op    POST-OPERATIVE DIAGNOSIS:   Perforated duodenal bulb ulcer Cirrhosis  PROCEDURE:  LAPAROSCOPIC ABDOMINAL EXPLORATION Laparoscopic Omental patch of perforated ulcer Core liver biopsies  SURGEON: Surgeon(s): Alfred Boston, MD  ASSISTANT: RN   Assessment  Stabilizing but w tachycardia  Plan:  -NPO  -NGT until flatus, then UGI to r/o leak/ostruction at ulcer/omental patch.  If OK, then clamping trial (?2-5 days from now?)  -PPI to help heal ulcer x 8 weeks minimum.  Start 51m BID IV initially then prob 40 BID when can take PO.  Most likely will need EGD in 6-8 weeks to make sure ulcer has healed vs biopsy  -FHx of colon cancer & age >>45- needs screening colonoscopy - could do when EGD done  -r/o H pylori.  Serology.  Tx if positive  -IV ABx (zosyn) x 7 days for diffuse severe peritonitis  -IVF resuscitation -CIWA for EtOH withdrawal -follow jaundice & path on liver biopsy.  GI eval if worse -VTE prophylaxis- SCDs, etc -mobilize as tolerated to help recovery -tob cessation -NEEDS A PCP!!  I updated the patient's status to the patient.  Recommendations were made.  Questions were answered.  The patient expressed understanding & appreciation.  STOP SMOKING! We talked to the patient about the dangers of smoking.  We stressed that tobacco use dramatically increases the risk of peri-operative complications such as infection, tissue necrosis leaving to problems with incision/wound and organ healing, hernia,  chronic pain, heart attack, stroke, DVT, pulmonary embolism, and death.  We noted there are programs in our community to help stop smoking.  Information was available.  STOP ALCOHOL USE!  Right inguinal hernia - could benefit from surgery to repair w SB constantly in it, but not this admit.  Needs elevated LFTs/hepatitis/jaundice addressed/stabilized   Alfred [name] M.D., F.A.C.S. [facility]astrointestinal [facility]imally [facility]vasive [facility] [address] [place] [phone] / Paging   12/13/2014  Subjective:  Sore but a little better ICU RN in room No major events  Objective:  Vital signs:  Filed Vitals:   12/13/14 0330 12/13/14 0400 12/13/14 0500 12/13/14 0600  BP: 109/74 103/73 107/78 104/73  Pulse:  103    Temp:  98.3 F (36.8 C)    TempSrc:  Oral    Resp: _0 Height:      Weight:      SpO2:  94%         Intake/Output   Yesterday:  07/09 0701 - 07/10 0700 In: 2339.6 [I.V.:2339.6] Out: 510 [Urine:250; Emesis/NG output:260] This shift:     Bowel function:  Flatus: n  BM: n  Drain: NGT thin bilious  Physical Exam:  General: Pt awake/alert/oriented x4 in mild acute distress Eyes: PERRL, normal EOM.  Sclera clear.  No icterus Neuro: CN II-XII intact w/o focal sensory/motor deficits. Lymph: No head/neck/groin lymphadenopathy Psych:  No delerium/psychosis/paranoia HENT: Normocephalic, Mucus membranes moist.  No thrush Neck: Supple, No tracheal deviation Chest: No chest wall pain w  good excursion CV:  Pulses intact.  Regular rhythm MS: Normal AROM mjr joints.  No obvious deformity Abdomen: Firm   Mildly distended.   Mod tender at epigastric abd mainly.  No evidence of peritonitis.  No incarcerated hernias. GU:  NEMG.  Large soft reducible RIH Ext:  SCDs BLE.  No mjr edema.  No cyanosis Skin: No petechiae / purpura  Results:   Labs: Results for orders placed or performed during  the hospital encounter of 12/12/14 (from the past 48 hour(s))  CBC with Differential/Platelet     Status: Abnormal   Collection Time: 12/12/14  4:21 PM  Result Value Ref Range   WBC 3.2 (L) 4.0 - 10.5 K/uL   RBC 4.30 4.22 - 5.81 MIL/uL   Hemoglobin 13.2 13.0 - 17.0 g/dL   HCT 37.6 (L) 39.0 - 52.0 %   MCV 87.4 78.0 - 100.0 fL   MCH 30.7 26.0 - 34.0 pg   MCHC 35.1 30.0 - 36.0 g/dL   RDW 15.0 11.5 - 15.5 %   Platelets 117 (L) 150 - 400 K/uL    Comment: SPECIMEN CHECKED FOR CLOTS REPEATED TO VERIFY PLATELET COUNT CONFIRMED BY SMEAR    Neutrophils Relative % 73 43 - 77 %   Lymphocytes Relative 23 12 - 46 %   Monocytes Relative 3 3 - 12 %   Eosinophils Relative 0 0 - 5 %   Basophils Relative 1 0 - 1 %   Neutro Abs 2.4 1.7 - 7.7 K/uL   Lymphs Abs 0.7 0.7 - 4.0 K/uL   Monocytes Absolute 0.1 0.1 - 1.0 K/uL   Eosinophils Absolute 0.0 0.0 - 0.7 K/uL   Basophils Absolute 0.0 0.0 - 0.1 K/uL   RBC Morphology TARGET CELLS   Comprehensive metabolic panel     Status: Abnormal   Collection Time: 12/12/14  4:21 PM  Result Value Ref Range   Sodium 131 (L) 135 - 145 mmol/L   Potassium 3.6 3.5 - 5.1 mmol/L   Chloride 101 101 - 111 mmol/L   CO2 19 (L) 22 - 32 mmol/L   Glucose, Bld 87 65 - 99 mg/dL   BUN 8 6 - 20 mg/dL   Creatinine, Ser 0.64 0.61 - 1.24 mg/dL   Calcium 8.2 (L) 8.9 - 10.3 mg/dL   Total Protein 6.4 (L) 6.5 - 8.1 g/dL   Albumin 3.0 (L) 3.5 - 5.0 g/dL   AST 396 (H) 15 - 41 U/L   ALT 241 (H) 17 - 63 U/L   Alkaline Phosphatase 132 (H) 38 - 126 U/L   Total Bilirubin 3.2 (H) 0.3 - 1.2 mg/dL   GFR calc non Af Amer >60 >60 mL/min   GFR calc Af Amer >60 >60 mL/min    Comment: (NOTE) The eGFR has been calculated using the CKD EPI equation. This calculation has not been validated in all clinical situations. eGFR's persistently <60 mL/min signify possible Chronic Kidney Disease.    Anion gap 11 5 - 15  Lipase, blood     Status: None   Collection Time: 12/12/14  4:21 PM  Result  Value Ref Range   Lipase 36 22 - 51 U/L  Ethanol     Status: Abnormal   Collection Time: 12/12/14  4:21 PM  Result Value Ref Range   Alcohol, Ethyl (B) 90 (H) <5 mg/dL    Comment:        LOWEST DETECTABLE LIMIT FOR SERUM ALCOHOL IS 5 mg/dL FOR MEDICAL PURPOSES ONLY   I-Stat  CG4 Lactic Acid, ED     Status: Abnormal   Collection Time: 12/12/14  4:29 PM  Result Value Ref Range   Lactic Acid, Venous 2.55 (HH) 0.5 - 2.0 mmol/L   Comment NOTIFIED PHYSICIAN   Protime-INR     Status: None   Collection Time: 12/12/14  7:31 PM  Result Value Ref Range   Prothrombin Time 13.4 11.6 - 15.2 seconds   INR 1.00 0.00 - 1.49  Ammonia     Status: None   Collection Time: 12/12/14  7:31 PM  Result Value Ref Range   Ammonia 31 9 - 35 umol/L  MRSA PCR Screening     Status: None   Collection Time: 12/13/14  2:06 AM  Result Value Ref Range   MRSA by PCR NEGATIVE NEGATIVE    Comment:        The GeneXpert MRSA Assay (FDA approved for NASAL specimens only), is one component of a comprehensive MRSA colonization surveillance program. It is not intended to diagnose MRSA infection nor to guide or monitor treatment for MRSA infections.   Comprehensive metabolic panel     Status: Abnormal   Collection Time: 12/13/14  5:55 AM  Result Value Ref Range   Sodium 135 135 - 145 mmol/L   Potassium 4.9 3.5 - 5.1 mmol/L    Comment: DELTA CHECK NOTED REPEATED TO VERIFY NO VISIBLE HEMOLYSIS    Chloride 108 101 - 111 mmol/L   CO2 20 (L) 22 - 32 mmol/L   Glucose, Bld 105 (H) 65 - 99 mg/dL   BUN 13 6 - 20 mg/dL   Creatinine, Ser 0.95 0.61 - 1.24 mg/dL   Calcium 7.2 (L) 8.9 - 10.3 mg/dL   Total Protein 6.0 (L) 6.5 - 8.1 g/dL   Albumin 2.7 (L) 3.5 - 5.0 g/dL   AST 331 (H) 15 - 41 U/L   ALT 208 (H) 17 - 63 U/L   Alkaline Phosphatase 93 38 - 126 U/L   Total Bilirubin 4.4 (H) 0.3 - 1.2 mg/dL   GFR calc non Af Amer >60 >60 mL/min   GFR calc Af Amer >60 >60 mL/min    Comment: (NOTE) The eGFR has been  calculated using the CKD EPI equation. This calculation has not been validated in all clinical situations. eGFR's persistently <60 mL/min signify possible Chronic Kidney Disease.    Anion gap 7 5 - 15  Lipase, blood     Status: None   Collection Time: 12/13/14  5:55 AM  Result Value Ref Range   Lipase 50 22 - 51 U/L  CBC     Status: Abnormal   Collection Time: 12/13/14  5:55 AM  Result Value Ref Range   WBC 10.7 (H) 4.0 - 10.5 K/uL   RBC 4.26 4.22 - 5.81 MIL/uL   Hemoglobin 12.8 (L) 13.0 - 17.0 g/dL   HCT 38.5 (L) 39.0 - 52.0 %   MCV 90.4 78.0 - 100.0 fL   MCH 30.0 26.0 - 34.0 pg   MCHC 33.2 30.0 - 36.0 g/dL   RDW 15.3 11.5 - 15.5 %   Platelets 90 (L) 150 - 400 K/uL    Comment: REPEATED TO VERIFY SPECIMEN CHECKED FOR CLOTS CONSISTENT WITH PREVIOUS RESULT     Imaging / Studies: Ct Abdomen Pelvis W Contrast  12/12/2014   CLINICAL DATA:  Acute onset of generalized abdominal pain. Rebound tenderness at the right upper quadrant. Initial encounter.  EXAM: CT ABDOMEN AND PELVIS WITH CONTRAST  TECHNIQUE: Multidetector CT imaging of  the abdomen and pelvis was performed using the standard protocol following bolus administration of intravenous contrast.  CONTRAST:  113m OMNIPAQUE IOHEXOL 300 MG/ML  SOLN  COMPARISON:  CT of the abdomen and pelvis performed 09/04/2013, and right upper quadrant ultrasound performed earlier today at 5:15 p.m.  FINDINGS: The visualized lung bases are clear.  A moderate to large amount of free air and free fluid are noted within the abdomen and pelvis. Soft tissue inflammation is noted tracking along both paracolic gutters. This is compatible with underlying bowel perforation. The location of perforation is not well characterized on this study.  Fluid is noted tracking about the gallbladder, with surrounding soft tissue inflammation. This could reflect mild cholecystitis, or could be reactive secondary to underlying bowel perforation and free fluid.  The liver and  spleen are unremarkable in appearance. Scattered air is noted tracking about the liver. The pancreas and adrenal glands are unremarkable.  The kidneys are unremarkable in appearance. There is no evidence of hydronephrosis. No renal or ureteral stones are seen. No perinephric stranding is appreciated. Minimal calcification is noted at the distal abdominal aorta and its branches.  A moderate to large right inguinal hernia is seen, containing a long segment of distal ileum. No associated soft tissue inflammation is seen to suggest strangulation. Trace free air is noted within the hernia.  There is focal wall thickening at the antrum of the stomach. This appears grossly stable from 2015 and may reflect chronic inflammation. No acute vascular abnormalities are seen.  The appendix is not well seen; there is no evidence of appendicitis. The colon is largely decompressed and is grossly unremarkable in appearance.  The bladder is mildly distended and grossly unremarkable. The prostate is normal in size. No inguinal lymphadenopathy is seen.  No acute osseous abnormalities are identified.  IMPRESSION: 1. Moderate to large amount of free air and free fluid within the abdomen and pelvis, tracking along the paracolic gutters. This is compatible with underlying bowel perforation. The location of perforation is not well characterized on this study. 2. Fluid tracking about the gallbladder, with surrounding soft tissue inflammation. This could reflect mild cholecystitis, or could be reactive secondary to underlying bowel perforation and free fluid. 3. Moderate to large right inguinal hernia, containing a long segment of distal ileum. No associated soft tissue inflammation seen to suggest strangulation. Trace free air noted within the hernia. 4. Focal wall thickening at the antrum of the stomach appears grossly stable from 2015 and may reflect chronic inflammation. Would correlate with the patient's symptoms; endoscopy could be  considered on an elective nonemergent basis.  Critical Value/emergent results were called by telephone at the time of interpretation on 12/12/2014 at 9:24 pm to Dr. MErnestina Patches who verbally acknowledged these results.   Electronically Signed   By: JGarald BaldingM.D.   On: 12/12/2014 21:57   Dg Abd Acute W/chest  12/12/2014   CLINICAL DATA:  Two day history of abdominal pain increasing in severity over the past 2 hr.  EXAM: DG ABDOMEN ACUTE W/ 1V CHEST  COMPARISON:  Chest x-ray 09/04/2013  FINDINGS: The upright chest x-ray demonstrates normal cardiomediastinal contours. No acute pulmonary findings. Minimal streaky basilar atelectasis. There is moderate free air noted in the abdomen.  Two views of the abdomen confirm large amount of free intraperitoneal air consistent with hollow viscus perforation. The soft tissue shadows of the abdomen are maintained. No worrisome calcifications. The bony structures are intact.  IMPRESSION: Free intraperitoneal air is noted, consistent with  hollow viscus perforation.  These results were called by telephone at the time of interpretation on 12/12/2014 at 8:16 pm to Dr. Ernestina Patches , who verbally acknowledged these results.   Electronically Signed   By: Marijo Sanes M.D.   On: 12/12/2014 20:16   US Abdomen Limited Ruq  12/12/2014   CLINICAL DATA:  Acute onset of right upper quadrant abdominal pain. Initial encounter.  EXAM: US ABDOMEN LIMITED - RIGHT UPPER QUADRANT  COMPARISON:  CT of the abdomen and pelvis performed 09/04/2013  FINDINGS: Gallbladder:  Mild gallbladder wall thickening is noted. This may reflect mild chronic inflammation. No stones are identified. No pericholecystic fluid is seen. No ultrasonographic Murphy's sign is elicited.  Common bile duct:  Diameter: 0.3 cm, within normal limits in caliber.  Liver:  No focal lesion identified. Diffusely nodular contour raises concern for hepatic cirrhosis, better characterized than on the prior study. Mildly increased  parenchymal echogenicity could reflect mild fatty infiltration.  IMPRESSION: 1. Mild gallbladder wall thickening may reflect mild chronic inflammation. No stones seen. No evidence for obstruction or acute cholecystitis. 2. Diffusely nodular hepatic contour raises concern for hepatic cirrhosis, better characterized than on the prior study. Mild fatty infiltration within the liver.   Electronically Signed   By: Garald Balding M.D.   On: 12/12/2014 18:23    Medications / Allergies: per chart  Antibiotics: Anti-infectives    Start     Dose/Rate Route Frequency Ordered Stop   12/12/14 2245  clindamycin (CLEOCIN) 900 mg, gentamicin (GARAMYCIN) 240 mg in sodium chloride 0.9 % 1,000 mL for intraperitoneal lavage  Status:  Discontinued    Comments:  Pharmacy may adjust dosing strength, schedule, rate of infusion, etc as needed to optimize therapy    Intraperitoneal To Surgery 12/12/14 2238 12/13/14 0205   12/12/14 2030  piperacillin-tazobactam (ZOSYN) IVPB 3.375 g     3.375 g 100 mL/hr over 30 Minutes Intravenous  Once 12/12/14 2020 12/12/14 2140        Note: Portions of this report may have been transcribed using voice recognition software. Every effort was made to ensure accuracy; however, inadvertent computerized transcription errors may be present.   Any transcriptional errors that result from this process are unintentional.     Adin [name], M.D., F.A.C.S. Gastrointestinal and Minimally Invasive Surgery [facility] [address] [place], [place] [address] [phone] Main / Paging   12/13/2014  CARE TEAM:  PCP: No PCP Per Patient  Outpatient Care Team: Patient Care Team: No Pcp Per Patient as PCP - General (General Practice)  Inpatient Treatment Team: Treatment Team: Attending Provider: Nolon Nations, MD; Consulting Physician: Nolon Nations, MD; Respiratory Therapist: Nelly Laurence, RRT; Registered Nurse: Lenice Pressman, RN

## 2014-12-13 NOTE — Anesthesia Postprocedure Evaluation (Signed)
  Anesthesia Post-op Note  Patient: Alfred [name]  Procedure(s) Performed: Procedure(s) (LRB): LAPAROSCOPIC ABDOMINAL EXPLORATION Omental patch for perforation and core liver biopsies (N/A)  Patient Location: PACU  Anesthesia Type: General  Level of Consciousness: awake and alert   Airway and Oxygen Therapy: Patient Spontanous Breathing  Post-op Pain: mild  Post-op Assessment: Post-op Vital signs reviewed, Patient's Cardiovascular Status Stable, Respiratory Function Stable, Patent Airway and No signs of Nausea or vomiting  Last Vitals:  Filed Vitals:   12/12/14 2030  BP: 132/90  Pulse:   Temp:   Resp: 20    Post-op Vital Signs: stable   Complications: No apparent anesthesia complications

## 2014-12-13 NOTE — Op Note (Signed)
12/13/2014  12:56 AM  PATIENT:  Alfred [name]  64 y.o. male  Patient Care Team: No Pcp Per Patient as PCP - General (General Practice)  PRE-OPERATIVE DIAGNOSIS:  Free air with perforation  POST-OPERATIVE DIAGNOSIS:    Perforated duodenal bulb ulcer Cirrhosis  PROCEDURE:  LAPAROSCOPIC ABDOMINAL EXPLORATION Laparoscopic Omental patch of perforated ulcer Core liver biopsies  SURGEON:  Surgeon(s): Karie Soda, MD  ASSISTANT: RN   ANESTHESIA:   local and general  EBL:  Total I/O In: 1000 [I.V.:1000] Out: 200 [Urine:200]  Delay start of Pharmacological VTE agent (>24hrs) due to surgical blood loss or risk of bleeding:  no  DRAINS: none   SPECIMEN:  Source of Specimen:  CORE LIVER BIOPSIES  DISPOSITION OF SPECIMEN:  PATHOLOGY  COUNTS:  YES  PLAN OF CARE: Admit to inpatient   PATIENT DISPOSITION:  ICU - extubated and stable.  INDICATION:   Patient with evidence of free air and perforation.  Suspicion of perforated ulcer.  I recommended surgery:  The anatomy & physiology of the digestive tract was discussed.  The pathophysiology of perforation was discussed.  Differential diagnosis such as perforated ulcer or colon, etc was discussed.   Natural history risks without surgery such as death was discussed.  I recommended abdominal exploration to diagnose & treat the source of the problem.  Laparoscopic & open techniques were discussed.   Risks such as bleeding, infection, abscess, leak, reoperation, bowel resection, possible ostomy, hernia, heart attack, death, and other risks were discussed.   The risks of no intervention will lead to serious problems including death.   I expressed a good likelihood that surgery will address the problem.    Goals of post-operative recovery were discussed as well.  We will work to minimize complications although risks in an emergent setting are high.   Questions were answered.  The patient expressed understanding & wishes to proceed with  surgery.      OR FINDINGS:   Perforated ulcer at the duodenal bulb.  Omental patch done.  Peritonitis on all visceral structures including gallbladder.  No definite evidence of cholecystitis. No appendicitis or diverticulitis.  His crit abscess.  DESCRIPTION:   Informed consent was confirmed.  The patient underwent general anaesthesia without difficulty.  The patient was positioned appropriately.  VTE prevention in place.  The patient's abdomen was clipped, prepped, & draped in a sterile fashion.  Surgical timeout confirmed our plan.  The patient was positioned in reverse Trendelenburg.  Abdominal entry with a 5mm port was gained using optical entry technique in the left upper abdomen.  Entry was clean.  I induced carbon dioxide insufflation.  Camera inspection revealed no injury.  Extra ports were carefully placed under direct laparoscopic visualization.  Peritonitis could be seen.  We aspirated bilious ascites.    Patient had a lobulated later strongly suspicious for early cirrhosis as well.  We did copious irrigation to help clear up infected ascites and phlegmon.  We did careful examination of the entire abdominal cavity.   reduced out his large right inguinal hernia with small bowel within it.  Topics of any obstruction or necrosis.  All bladder had some inflammation but consistent with diffuse peritonitis.  Consistent with inflammation on the small bowel and colon as well.  The biggest area of contamination with some particulate matter was in the right upper quadrant. There is no frank evidence of appendicitis or diverticulitis.  No evidence of a perforated Meckel's diverticulum.  We carefully mobilized the omentum and small  bowel, focusing along the stomach and duodenal sweep.  After inspecting the upper abdomen, we could see the perforated ulcer at the duodenal bulb.   I mobilized a tongue of greater omentum.  I laid it over the perforated ulcer.  I proceeded to do an omental patch by placing  2-0 V- lock absorbable stitches on either side of the ulcer x 2 in a horizontal mattress fashion.  We tied that down to help tack the omentum as a plug of the perforation.  This is the classic Cheree DittoGraham omental patch.  This provided a good watertight seal of the abscess.  We did 10L copious irrigation and reinspection of the abdominal cavity.    After this copious irrigation, the aspirated was much more clear.   I did a final irrigation with 1L clindamycin/gentamicin antibiotic solution.  We allowed that to rest while I did core liver biopsies to the medial left hepatic lobe 3 good cores.   I did reinspection of the abdomen.  Hemostasis is good.  Aspirated the anabolic solution since that it rested for 10 minutes.   We then evacuated carbon dioxide to remove the ports.  I closed the ports using Monocryl stitch a sterile dressings.  I am about to locate family to discuss intraoperative findings and postoperative care.  Ardeth SportsmanSteven C. Meril Dray, M.D., F.A.C.S. Gastrointestinal and Minimally Invasive Surgery [facility] [address] [place], [place] [id number] [phone] Main / Paging

## 2014-12-13 NOTE — Progress Notes (Signed)
eLink Physician-Brief Progress Note Patient Name: Alfred AlfWilliam S [name] DOB: [birth year] MRN: [id number]   Date of Service  12/13/2014  HPI/Events of Note  Pt seen in icu s/p lap patch repair of perf duodenal bulb, cirrhosis.  Pt appears stable  eICU Interventions  No interventions needed      Intervention Category Evaluation Type: New Patient Evaluation  Shan Levansatrick Rio Taber 12/13/2014, 2:47 AM

## 2014-12-13 NOTE — Discharge Instructions (Signed)
Peptic Ulcer A peptic ulcer is a sore in the lining of your esophagus (esophageal ulcer), stomach (gastric ulcer), or in the first part of your small intestine (duodenal ulcer). The ulcer causes erosion into the deeper tissue. CAUSES  Normally, the lining of the stomach and the small intestine protects itself from the acid that digests food. The protective lining can be damaged by:  An infection caused by a bacterium called Helicobacter pylori (H. pylori).  Regular use of nonsteroidal anti-inflammatory drugs (NSAIDs), such as ibuprofen or aspirin.  Smoking tobacco. Other risk factors include being older than 50, drinking alcohol excessively, and having a family history of ulcer disease.  SYMPTOMS   Burning pain or gnawing in the area between the chest and the belly button.  Heartburn.  Nausea and vomiting.  Bloating. The pain can be worse on an empty stomach and at night. If the ulcer results in bleeding, it can cause:  Black, tarry stools.  Vomiting of bright red blood.  Vomiting of coffee-ground-looking materials. DIAGNOSIS  A diagnosis is usually made based upon your history and an exam. Other tests and procedures may be performed to find the cause of the ulcer. Finding a cause will help determine the best treatment. Tests and procedures may include:  Blood tests, stool tests, or breath tests to check for the bacterium H. pylori.  An upper gastrointestinal (GI) series of the esophagus, stomach, and small intestine.  An endoscopy to examine the esophagus, stomach, and small intestine.  A biopsy. TREATMENT  Treatment may include:  Eliminating the cause of the ulcer, such as smoking, NSAIDs, or alcohol.  Medicines to reduce the amount of acid in your digestive tract.  Antibiotic medicines if the ulcer is caused by the H. pylori bacterium.  An upper endoscopy to treat a bleeding ulcer.  Surgery if the bleeding is severe or if the ulcer created a hole somewhere in the  digestive system. HOME CARE INSTRUCTIONS   Avoid tobacco, alcohol, and caffeine. Smoking can increase the acid in the stomach, and continued smoking will impair the healing of ulcers.  Avoid foods and drinks that seem to cause discomfort or aggravate your ulcer.  Only take medicines as directed by your caregiver. Do not substitute over-the-counter medicines for prescription medicines without talking to your caregiver.  Keep any follow-up appointments and tests as directed. SEEK MEDICAL CARE IF:   Your do not improve within 7 days of starting treatment.  You have ongoing indigestion or heartburn. SEEK IMMEDIATE MEDICAL CARE IF:   You have sudden, sharp, or persistent abdominal pain.  You have bloody or dark black, tarry stools.  You vomit blood or vomit that looks like coffee grounds.  You become light-headed, weak, or feel faint.  You become sweaty or clammy. MAKE SURE YOU:   Understand these instructions.  Will watch your condition.  Will get help right away if you are not doing well or get worse. Document Released: 05/19/2000 Document Revised: 10/06/2013 Document Reviewed: 12/20/2011 [facility] Patient Information 2015 [place], [place]. This information is not intended to replace advice given to you by your health care provider. Make sure you discuss any questions you have with your health care provider.  LAPAROSCOPIC SURGERY: POST OP INSTRUCTIONS  1. DIET: Follow a light bland diet the first 24 hours after arrival home, such as soup, liquids, crackers, etc.  Be sure to include lots of fluids daily.  Avoid fast food or heavy meals as your are more likely to get nauseated.  Eat a  low fat the next few days after surgery.   2. Take your usually prescribed home medications unless otherwise directed. 3. PAIN CONTROL: a. Pain is best controlled by a usual combination of three different methods TOGETHER: i. Ice/Heat ii. Over the counter pain medication iii. Prescription pain  medication b. Most patients will experience some swelling and bruising around the incisions.  Ice packs or heating pads (30-60 minutes up to 6 times a day) will help. Use ice for the first few days to help decrease swelling and bruising, then switch to heat to help relax tight/sore spots and speed recovery.  Some people prefer to use ice alone, heat alone, alternating between ice & heat.  Experiment to what works for you.  Swelling and bruising can take several weeks to resolve.   c. It is helpful to take an over-the-counter pain medication regularly for the first few weeks.  Choose one of the following that works best for you: i. Naproxen (Aleve, etc)  Two 220mg  tabs twice a day ii. Ibuprofen (Advil, etc) Three 200mg  tabs four times a day (every meal & bedtime) iii. Acetaminophen (Tylenol, etc) 500-650mg  four times a day (every meal & bedtime) d. A  prescription for pain medication (such as oxycodone, hydrocodone, etc) should be given to you upon discharge.  Take your pain medication as prescribed.  i. If you are having problems/concerns with the prescription medicine (does not control pain, nausea, vomiting, rash, itching, etc), please call us [phone] to see if we need to switch you to a different pain medicine that will work better for you and/or control your side effect better. ii. If you need a refill on your pain medication, please contact your pharmacy.  They will contact our office to request authorization. Prescriptions will not be filled after 5 pm or on week-ends. 4. Avoid getting constipated.  Between the surgery and the pain medications, it is common to experience some constipation.  Increasing fluid intake and taking a fiber supplement (such as Metamucil, Citrucel, FiberCon, MiraLax, etc) 1-2 times a day regularly will usually help prevent this problem from occurring.  A mild laxative (prune juice, Milk of Magnesia, MiraLax, etc) should be taken according to package directions if there  are no bowel movements after 48 hours.   5. Watch out for diarrhea.  If you have many loose bowel movements, simplify your diet to bland foods & liquids for a few days.  Stop any stool softeners and decrease your fiber supplement.  Switching to mild anti-diarrheal medications (Kayopectate, Pepto Bismol) can help.  If this worsens or does not improve, please call us. 6. Wash / shower every day.  You may shower over the dressings as they are waterproof.  Continue to shower over incision(s) after the dressing is off. 7. Remove your waterproof bandages 5 days after surgery.  You may leave the incision open to air.  You may replace a dressing/Band-Aid to cover the incision for comfort if you wish.  8. ACTIVITIES as tolerated:   a. You may resume regular (light) daily activities beginning the next day--such as daily self-care, walking, climbing stairs--gradually increasing activities as tolerated.  If you can walk 30 minutes without difficulty, it is safe to try more intense activity such as jogging, treadmill, bicycling, low-impact aerobics, swimming, etc. b. Save the most intensive and strenuous activity for last such as sit-ups, heavy lifting, contact sports, etc  Refrain from any heavy lifting or straining until you are off narcotics for pain control.  c. DO NOT PUSH THROUGH PAIN.  Let pain be your guide: If it hurts to do something, don't do it.  Pain is your body warning you to avoid that activity for another week until the pain goes down. d. You may drive when you are no longer taking prescription pain medication, you can comfortably wear a seatbelt, and you can safely maneuver your car and apply brakes. e. Bonita Quin may have sexual intercourse when it is comfortable.  9. FOLLOW UP in our office a. Please call [facility] at [phone] to set up an appointment to see your surgeon in the office for a follow-up appointment approximately 2-3 weeks after your surgery. b. Make sure that you call for this appointment  the day you arrive home to insure a convenient appointment time. 10. IF YOU HAVE DISABILITY OR FAMILY LEAVE FORMS, BRING THEM TO THE OFFICE FOR PROCESSING.  DO NOT GIVE THEM TO YOUR DOCTOR.   WHEN TO CALL us [phone]: 1. Poor pain control 2. Reactions / problems with new medications (rash/itching, nausea, etc)  3. Fever over 101.5 F (38.5 C) 4. Inability to urinate 5. Nausea and/or vomiting 6. Worsening swelling or bruising 7. Continued bleeding from incision. 8. Increased pain, redness, or drainage from the incision   The clinic staff is available to answer your questions during regular business hours (8:30am-5pm).  Please dont hesitate to call and ask to speak to one of our nurses for clinical concerns.   If you have a medical emergency, go to the nearest emergency room or call 911.  A surgeon from [facility] is always on call at the [facility], [place] [address], [address], [place], [place]  [address] ? MAIN: [phone] ? TOLL FREE: [phone] ?  FAX [phone] [email or web address]  GETTING TO GOOD BOWEL HEALTH. Irregular bowel habits such as constipation and diarrhea can lead to many problems over time.  Having one soft bowel movement a day is the most important way to prevent further problems.  The anorectal canal is designed to handle stretching and feces to safely manage our ability to get rid of solid waste (feces, poop, stool) out of our body.  BUT, hard constipated stools can act like ripping concrete bricks and diarrhea can be a burning fire to this very sensitive area of our body, causing inflamed hemorrhoids, anal fissures, increasing risk is perirectal abscesses, abdominal pain/bloating, an making irritable bowel worse.      The goal: ONE SOFT BOWEL MOVEMENT A DAY!  To have soft, regular bowel movements:   Drink plenty of fluids, consider 4-6 tall glasses of water a day.    Take plenty of fiber.   Fiber is the undigested part of plant food that passes into the colon, acting s natures broom to encourage bowel motility and movement.  Fiber can absorb and hold large amounts of water. This results in a larger, bulkier stool, which is soft and easier to pass. Work gradually over several weeks up to 6 servings a day of fiber (25g a day even more if needed) in the form of: o Vegetables -- Root (potatoes, carrots, turnips), leafy green (lettuce, salad greens, celery, spinach), or cooked high residue (cabbage, broccoli, etc) o Fruit -- Fresh (unpeeled skin & pulp), Dried (prunes, apricots, cherries, etc ),  or stewed ( applesauce)  o Whole grain breads, pasta, etc (whole wheat)  o Bran cereals   Bulking Agents -- This type of water-retaining fiber generally  is easily obtained each day by one of the following:  o Psyllium bran -- The psyllium plant is remarkable because its ground seeds can retain so much water. This product is available as Metamucil, Konsyl, Effersyllium, Per Diem Fiber, or the less expensive generic preparation in drug and health food stores. Although labeled a laxative, it really is not a laxative.  o Methylcellulose -- This is another fiber derived from wood which also retains water. It is available as Citrucel. o Polyethylene Glycol - and artificial fiber commonly called Miralax or Glycolax.  It is helpful for people with gassy or bloated feelings with regular fiber o Flax Seed - a less gassy fiber than psyllium  No reading or other relaxing activity while on the toilet. If bowel movements take longer than 5 minutes, you are too constipated  AVOID CONSTIPATION.  High fiber and water intake usually takes care of this.  Sometimes a laxative is needed to stimulate more frequent bowel movements, but   Laxatives are not a good long-term solution as it can wear the colon out.  They can help jump-start bowels if constipated, but should be relied on constantly without discussing with  your doctor o Osmotics (Milk of Magnesia, Fleets phosphosoda, Magnesium citrate, MiraLax, GoLytely) are safer than  o Stimulants (Senokot, Castor Oil, Dulcolax, Ex Lax)    o Avoid taking laxatives for more than 7 days in a row.   IF SEVERELY CONSTIPATED, try a Bowel Retraining Program: o Do not use laxatives.  o Eat a diet high in roughage, such as bran cereals and leafy vegetables.  o Drink six (6) ounces of prune or apricot juice each morning.  o Eat two (2) large servings of stewed fruit each day.  o Take one (1) heaping tablespoon of a psyllium-based bulking agent twice a day. Use sugar-free sweetener when possible to avoid excessive calories.  o Eat a normal breakfast.  o Set aside 15 minutes after breakfast to sit on the toilet, but do not strain to have a bowel movement.  o If you do not have a bowel movement by the third day, use an enema and repeat the above steps.   Controlling diarrhea o Switch to liquids and simpler foods for a few days to avoid stressing your intestines further. o Avoid dairy products (especially milk & ice cream) for a short time.  The intestines often can lose the ability to digest lactose when stressed. o Avoid foods that cause gassiness or bloating.  Typical foods include beans and other legumes, cabbage, broccoli, and dairy foods.  Every person has some sensitivity to other foods, so listen to our body and avoid those foods that trigger problems for you. o Adding fiber (Citrucel, Metamucil, psyllium, Miralax) gradually can help thicken stools by absorbing excess fluid and retrain the intestines to act more normally.  Slowly increase the dose over a few weeks.  Too much fiber too soon can backfire and cause cramping & bloating. o Probiotics (such as active yogurt, Align, etc) may help repopulate the intestines and colon with normal bacteria and calm down a sensitive digestive tract.  Most studies show it to be of mild help, though, and such products can be  costly. o Medicines: - Bismuth subsalicylate (ex. Kayopectate, Pepto Bismol) every 30 minutes for up to 6 doses can help control diarrhea.  Avoid if pregnant. - Loperamide (Immodium) can slow down diarrhea.  Start with two tablets (4mg  total) first and then try one tablet every 6 hours.  Avoid if you are having fevers or severe pain.  If you are not better or start feeling worse, stop all medicines and call your doctor for advice o Call your doctor if you are getting worse or not better.  Sometimes further testing (cultures, endoscopy, X-ray studies, bloodwork, etc) may be needed to help diagnose and treat the cause of the diarrhea.  TROUBLESHOOTING IRREGULAR BOWELS 1) Avoid extremes of bowel movements (no bad constipation/diarrhea) 2) Miralax 17gm mixed in 8oz. water or juice-daily. May use BID as needed.  3) Gas-x,Phazyme, etc. as needed for gas & bloating.  4) Soft,bland diet. No spicy,greasy,fried foods.  5) Prilosec over-the-counter as needed  6) May hold gluten/wheat products from diet to see if symptoms improve.  7)  May try probiotics (Align, Activa, etc) to help calm the bowels down 7) If symptoms become worse call back immediately.  PEPTIC ULCER DEFINITION -- Peptic ulcers are open sores in the upper part of the digestive tract (figure 1) that can cause stomach pain or stomach upset, and that can lead to internal bleeding. There are two types of peptic ulcers: ?Gastric ulcers, which form on the lining of the stomach ?Duodenal ulcers, which form on the lining of the upper part of the small intestine (called the duodenum) In some cases, peptic ulcers heal without treatment, but ulcers that have not been treated tend to recur. Many people with ulcers (sometimes called peptic ulcer disease) need treatment to relieve symptoms and prevent complications. PEPTIC ULCER CAUSES -- Peptic ulcers form when acid erodes the lining of the digestive tract. This can happen when there is excess acid  in the system, or when the protective layer of mucus on the lining is broken down (making it more susceptible to damage). There are two major causes of peptic ulcers, bacterial infection and the use of pain relievers called nonsteroidal anti-inflammatory medications (NSAIDs). NSAIDs include aspirin, ibuprofen (sample brand names: Advil, Motrin), and naproxen (sample brand name: Aleve). H. pylori infection -- Helicobacter pylori is a type of bacteria that lives in the digestive tract. H. pylori is very common; some data suggest that it is present in approximately 50 percent of people. (See "Patient information: H. pylori infection (The Basics)".) Most people who have H. pylori do not develop ulcers, but some do. This is because the bacteria can cause the following, all of which can contribute to peptic ulcer formation: ?An increase in the amount of acid in the stomach and small intestine ?Inflammation of the lining of the digestive tract ?A breakdown of the protective mucous layer NSAIDs -- The use of NSAIDs can also cause peptic ulcers in some people. They are commonly used to relieve pain and reduce inflammation. Many people also take low-dose aspirin daily to prevent heart attack or stroke. NSAIDs can cause changes in the protective mucous layer of the digestive tract, leading to ulcers in some people. The risk of ulcer formation depends on multiple factors, including the NSAID type, dose, and duration of use. Other risk factors -- Neither the presence of H. pylori nor the use of NSAIDs causes ulcers in every case; there are other factors as well: ?Genetics likely play a role, as studies have shown that having a family member with peptic ulcers makes a person more likely to develop ulcers as well. ?People who smoke cigarettes are more likely than nonsmokers to develop peptic ulcers. ?While drinking alcohol does not appear to be a cause of ulcers, alcohol abuse can interfere with ulcer  healing. ?Although  certain foods and beverages can cause stomach upset, there is no good evidence that they cause or worsen ulcers. Still, eating a healthy diet with plenty of fruits, vegetables, and fiber may decrease the risk of ulcers. ?The role of psychological stress in the formation of ulcers is controversial. There is some evidence that psychological factors (such as stress, anxiety, and depression) may contribute to the development of ulcers as well as impaired healing and increased recurrence. However, this relationship is not fully understood, as there are many other variables involved (eg, the presence or absence of H. pylori; use of NSAIDS; other individual characteristics) and stress can be difficult to measure and study. ?Other (non-NSAID) medications and health conditions can also cause ulcers, but this is fairly uncommon. PEPTIC ULCER SYMPTOMS -- Some people with peptic ulcers do not have any symptoms. (Ulcers that cause no symptoms are sometimes called silent ulcers.) People who do have symptoms may experience any of the following: ?Upper abdominal pain or discomfort (often a burning or hunger-like feeling) ?Feeling full quickly when eating ?Stomach pain, belching, or feeling bloated after eating ?Heartburn or acid reflux  ?Nausea ?Vomiting (in severe cases, there may be blood in the vomit) ?Blood in the stools (which may cause stool to appear black or tar-like) Duodenal ulcers tend to cause abdominal pain that comes on several hours after eating (often during the night); this is due to the presence of acid in the digestive tract without a food buffer. Eating or taking an acid-reducing medication may relieve symptoms. PEPTIC ULCER DIAGNOSIS -- Many of the symptoms of peptic ulcers can also be caused by other conditions, including acid reflux or gallstones. Your healthcare provider will review your history and symptoms, and can run tests to determine if you have an ulcer. Upper  endoscopy -- An upper endoscopy is a procedure in which a thin, flexible tube is inserted into the mouth and down the throat. The tube has a light and a tiny camera on the end that projects images from within the digestive tract onto a monitor. (See "Patient information: Upper endoscopy (The Basics)".) Ulcers can often be diagnosed through upper endoscopy. A small sample of tissue, called a biopsy, can also be taken to check for abnormal cells, cancer, or an infection with H. pylori. Barium swallow -- In some cases, a barium swallow may be done. This involves drinking a thick substance containing barium while X-rays are taken; the barium allows the digestive tract to be seen more clearly. This procedure is less common than endoscopy for diagnosing ulcers, but may be appropriate for some patients. H. pylori testing -- Anyone with a confirmed peptic ulcer should be tested for H. pylori so that the infection, if present, can be treated (see 'Treatment of H. pylori' below). In people who have had a biopsy, the sample can be tested for infection. People who have not had a biopsy can instead have a breath or a stool sample test to check for H. pylori. Blood tests are also available, but may be less reliable. PEPTIC ULCER TREATMENT -- The exact course of treatment for peptic ulcers depends on the underlying cause. Most ulcers can be healed with medications. Identifying the cause -- Your healthcare provider will first try to determine what has caused your ulcer, since some causes (eg, H. pylori infection) need to be treated directly in order for the ulcer to heal. Treatment of H. pylori -- H. pylori is treated with several medications, usually including two antibiotics (to kill the bacteria)  and an acid-suppressing medication called a proton pump inhibitor (PPI). Proton pump inhibitors include esomeprazole (sample brand name: Nexium), lansoprazole (sample brand name: Prevacid), and omeprazole (sample brand name:  Prilosec) (table 1). Treatment for H. pylori usually takes two weeks. Treatment of ulcers not due to H. pylori -- If you have an ulcer but tested negative for H. pylori, your healthcare provider will still probably prescribe an acid-suppressing medication in order to help the ulcer heal. This may be a proton pump inhibitor (see above) or a medication called an H2 receptor antagonist. The H2 receptor antagonists include ranitidine (sample brand name: Zantac) and famotidine (sample brand name: Pepcid). You should take your ulcer medication as directed, even if your ulcer doesnt cause bothersome symptoms. Some people can stop the medication after four to six weeks; others may need to keep taking it for longer if their ulcers are large or at risk of recurring, or if they have had complications due to ulcers in the past (see 'Peptic ulcer complications' below). Stopping NSAIDs -- If you are taking any NSAID medications, your provider will probably advise you to stop them, regardless of whether or not they caused your ulcer. He or she may recommend alternative medications to NSAIDs, such as acetaminophen (sample brand name: Tylenol). If it is not possible for you to stop taking NSAIDs, you will likely need to keep taking a proton pump inhibitor medication as well. This is to help protect the lining of the digestive tract and reduce the risk of bleeding (see 'Bleeding' below). Other methods of symptom relief -- In addition to taking prescribed medications and avoiding NSAIDs, there are other things you can do to relieve symptoms and help ulcers to heal: ?Quit smoking, if you smoke ?Limit the amount of alcohol you drink ?Take antacids if they help you feel better PEPTIC ULCER COMPLICATIONS -- Although most peptic ulcers heal completely with treatment, they can sometimes lead to complications. The risk of serious complications depends on the cause of the ulcer, the size and location of the ulcer, and the persons  age and health. Bleeding -- Bleeding ulcers most often affect older people. Symptoms may include blood in the vomit or in the stool (this can give stools a black, tar-like appearance). People with bleeding ulcers usually need to take a proton pump inhibitor (see 'Peptic ulcer treatment' above). Some people also need IV fluids and blood transfusions in the hospital.  Ulcers that are actively bleeding, or are at risk of bleeding again, can be treated during an upper endoscopy (see 'Upper endoscopy' above). Treatment may involve cauterizing the ulcer, applying tiny clips to close off the blood vessels, or injecting a medication called epinephrine. The goal is to stop the bleeding and prevent future bleeding. In rare cases, a person with a bleeding ulcer may need surgery or embolization. This involves identifying the specific blood vessels that are the source of the bleeding, and blocking off the flow of blood through them. Perforation -- Perforation is when an ulcer leads to a hole or puncture in the wall of the stomach or duodenum. Symptoms include sudden, severe abdominal pain, a rapid heartbeat, and a low body temperature. Pain may radiate to one or both shoulders, and the abdomen may become rigid. It is important to treat a perforated ulcer as quickly as possible. Treatment usually involves insertion of a nasogastric tube (a tube that goes through the nose into the stomach), IV fluids, and medications; some people also require surgery. Obstruction -- Gastric outlet obstruction  is a less common complication of peptic ulcers. It refers to an obstruction or blockage of the outlet of the stomach (the part that leads to the small intestine). Vomiting is the most common symptom; other symptoms include feeling full quickly after eating, bloating, abdominal pain, loss of appetite, and nausea. Gastric outlet obstruction is treated by inserting a nasogastric tube to remove food and fluid that has been unable to pass  from the stomach into the small intestine. Many people also need IV fluids to stay hydrated. If the obstruction is related to an ulcer that was caused by H. pylori or NSAID use, addressing those causes (treating the H. pylori infection and/or stopping NSAIDs, along with treating the ulcer with acid-suppressing medication) often resolves the obstruction. For people who dont respond to medication, obstruction can be treated during an endoscopy (see 'Upper endoscopy' above). This is done by inserting a tiny balloon to dilate (open) the gastric outlet. A biopsy may be performed to rule out other, more serious causes of obstruction, such as stomach cancer. If balloon dilation is not possible (or doesnt work), surgery may be an option. FOLLOW-UP -- Whether or not you need follow-up monitoring for your ulcer depends on: ?The size, location, and cause of the ulcer ?How the ulcer has responded to treatment ?Whether there were any complications Duodenal ulcers -- If you have been treated for a duodenal ulcer without complications, you most likely will not need any follow-up monitoring of the ulcer itself. If the ulcer was large or led to other problems (such as bleeding or perforation), or if your symptoms persist or recur, your healthcare provider may recommend a repeat endoscopy to make sure the ulcer is healing.  Gastric ulcers -- Some healthcare providers recommend a follow-up endoscopy for anyone with a gastric ulcer, to confirm that the ulcer has healed and does not contain any cancerous cells. If you had a biopsy when your gastric ulcer was initially diagnosed, and the ulcer has responded well to treatment, you may not need follow-up. Your healthcare provider will decide whether a repeat endoscopy is necessary based on your individual situation. All ulcers caused by H. pylori -- If your ulcer was due to H. pylori, your healthcare provider will probably do a test to confirm that the infection is gone (see 'H.  pylori testing' above). However, the blood test cannot be used to determine successful clearance of the infection. Instead, a stool or breath test is usually performed about four weeks after the initial course of treatment is completed. This is because some medications (including antibiotics and proton pump inhibitors) can cause a false negative test result even if H. pylori is still present. WHERE TO GET MORE INFORMATION -- Your healthcare provider is the best source of information for questions and concerns related to your medical problem. This article will be updated as needed on our web site ([email or web address]). Related topics for patients, as well as selected articles written for healthcare professionals, are also available. Some of the most relevant are listed below. Patient level information -- UpToDate offers two types of patient education materials. The Basics -- The Basics patient education pieces answer the four or five key questions a patient might have about a given condition. These articles are best for patients who want a general overview and who prefer short, easy-to-read materials. Patient information: Peptic ulcers (The Basics) Patient information: H. pylori infection (The Basics) Patient information: GI bleed (The Basics) Patient information: Gastritis (The Basics) Beyond the Basics -- Beyond the  Basics patient education pieces are longer, more sophisticated, and more detailed. These articles are best for patients who want in-depth information and are comfortable with some medical jargon. Patient information: Helicobacter pylori infection and treatment (Beyond the Basics) Patient information: Upset stomach (functional dyspepsia) in adults (Beyond the Basics) Patient information: Acid reflux (gastroesophageal reflux disease) in adults (Beyond the Basics) Patient information: Gallstones (Beyond the Basics) Patient information: Upper endoscopy (Beyond the Basics) Patient  information: Quitting smoking (Beyond the Basics) Professional level information -- Professional level articles are designed to keep doctors and other health professionals up-to-date on the latest medical findings. These articles are thorough, long, and complex, and they contain multiple references to the research on which they are based. Professional level articles are best for people who are comfortable with a lot of medical terminology and who want to read the same materials their doctors are reading. Association between Helicobacter pylori infection and duodenal ulcer Peptic ulcer disease: Clinical manifestations and diagnosis Overview of the complications of peptic ulcer disease Peptic ulcer disease: Management Peptic ulcer disease: Genetic, environmental, and psychological risk factors and pathogenesis Approach to refractory or recurrent peptic ulcer disease Surgical management of peptic ulcer disease Overview of the treatment of bleeding peptic ulcers Unusual causes of peptic ulcer disease  The following organizations also provide reliable health information. ?[employer] of Medicine (LostMillions.com.pt.html) ?Centers for Disease Control and Prevention (CDC) Phone: 7781303751 Toll-free: [phone] (FootballExhibition.com.br) ?[employer] of Diabetes and Digestive and Kidney Diseases Phone: ([phone] (CarFlippers.tn) ?The American Gastroenterological Association (www.gastro.org) ?[employer] of Gastroenterology [employer])   Alcoholic Liver Disease Alcoholic liver disease means you have a damaged liver that does not work properly. This disease is caused by drinking too much alcohol. Some people do not have any problems until the disease has become very bad. Problems can be worse after a period of heavy drinking. HOME CARE  Stop drinking alcohol.  Get expert advice or help (counseling) to quit drinking. Join an alchohol support  group.  You may need to eat foods that give you energy (carbohydrates), such as:  Milk and yogurt.  Navy, pinto, and white beans.  Applesauce, grapes, dried dates, prunes, and raisins.  Potatoes and rice.  Eat foods that are high in vitamins, especially thiamine and folic acid. These foods include:  Whole-wheat or whole-grain breads and cereals. Look on the package for "added thiamine" or "added folic acid."  Meat, especially pork.  Fresh, raw vegetables.  Fresh fruits and vegetables, such as oranges, orange juice, avocados, beets, and cantaloupe.  Dark green, leafy vegetables, such as romaine lettuce, spinach, and broccoli.  Beans and nuts.  Dairy foods, such as milk, butter, yogurt, cheese, and ice cream. GET HELP RIGHT AWAY IF:   You have bright red blood in your poop (stool).  You cough or throw up (vomit) blood.  Your skin and eyes turn more yellow.  You have a bad headache or problems thinking.  You have trouble walking. MAKE SURE YOU:   Understand these instructions.  Will watch your condition.  Will get help right away if you are not doing well or get worse. Document Released: 03/19/2009 Document Revised: 08/14/2011 Document Reviewed: 03/19/2009 [facility] Patient Information 2015 [place], [place]. This information is not intended to replace advice given to you by your health care provider. Make sure you discuss any questions you have with your health care provider.  STOP SMOKING!  We strongly recommend that you stop smoking.  Smoking increases the risk of surgery  including infection in the form of an open wound, pus formation, abscess, hernia at an incision on the abdomen, etc.  You have an increased risk of other MAJOR complications such as stroke, heart attack, forming clots in the leg and/or lungs, and death.    Smoking Cessation Quitting smoking is important to your health and has many advantages. However, it is not always easy to quit since nicotine is  a very addictive drug. Often times, people try 3 times or more before being able to quit. This document explains the best ways for you to prepare to quit smoking. Quitting takes hard work and a lot of effort, but you can do it. ADVANTAGES OF QUITTING SMOKING  You will live longer, feel better, and live better.  Your body will feel the impact of quitting smoking almost immediately.  Within 20 minutes, blood pressure decreases. Your pulse returns to its normal level.  After 8 hours, carbon monoxide levels in the blood return to normal. Your oxygen level increases.  After 24 hours, the chance of having a heart attack starts to decrease. Your breath, hair, and body stop smelling like smoke.  After 48 hours, damaged nerve endings begin to recover. Your sense of taste and smell improve.  After 72 hours, the body is virtually free of nicotine. Your bronchial tubes relax and breathing becomes easier.  After 2 to 12 weeks, lungs can hold more air. Exercise becomes easier and circulation improves.  The risk of having a heart attack, stroke, cancer, or lung disease is greatly reduced.  After 1 year, the risk of coronary heart disease is cut in half.  After 5 years, the risk of stroke falls to the same as a nonsmoker.  After 10 years, the risk of lung cancer is cut in half and the risk of other cancers decreases significantly.  After 15 years, the risk of coronary heart disease drops, usually to the level of a nonsmoker.  If you are pregnant, quitting smoking will improve your chances of having a healthy baby.  The people you live with, especially any children, will be healthier.  You will have extra money to spend on things other than cigarettes. QUESTIONS TO THINK ABOUT BEFORE ATTEMPTING TO QUIT You may want to talk about your answers with your caregiver.  Why do you want to quit?  If you tried to quit in the past, what helped and what did not?  What will be the most difficult  situations for you after you quit? How will you plan to handle them?  Who can help you through the tough times? Your family? Friends? A caregiver?  What pleasures do you get from smoking? What ways can you still get pleasure if you quit? Here are some questions to ask your caregiver:  How can you help me to be successful at quitting?  What medicine do you think would be best for me and how should I take it?  What should I do if I need more help?  What is smoking withdrawal like? How can I get information on withdrawal? GET READY  Set a quit date.  Change your environment by getting rid of all cigarettes, ashtrays, matches, and lighters in your home, car, or work. Do not let people smoke in your home.  Review your past attempts to quit. Think about what worked and what did not. GET SUPPORT AND ENCOURAGEMENT You have a better chance of being successful if you have help. You can get support in many ways.  Tell your family, friends, and co-workers that you are going to quit and need their support. Ask them not to smoke around you.  Get individual, group, or telephone counseling and support. Programs are available at [employer] and health centers. Call your local health department for information about programs in your area.  Spiritual beliefs and practices may help some smokers quit.  Download a "quit meter" on your computer to keep track of quit statistics, such as how long you have gone without smoking, cigarettes not smoked, and money saved.  Get a self-help book about quitting smoking and staying off of tobacco. LEARN NEW SKILLS AND BEHAVIORS  Distract yourself from urges to smoke. Talk to someone, go for a walk, or occupy your time with a task.  Change your normal routine. Take a different route to work. Drink tea instead of coffee. Eat breakfast in a different place.  Reduce your stress. Take a hot bath, exercise, or read a book.  Plan something enjoyable to do every  day. Reward yourself for not smoking.  Explore interactive web-based programs that specialize in helping you quit. GET MEDICINE AND USE IT CORRECTLY Medicines can help you stop smoking and decrease the urge to smoke. Combining medicine with the above behavioral methods and support can greatly increase your chances of successfully quitting smoking.  Nicotine replacement therapy helps deliver nicotine to your body without the negative effects and risks of smoking. Nicotine replacement therapy includes nicotine gum, lozenges, inhalers, nasal sprays, and skin patches. Some may be available over-the-counter and others require a prescription.  Antidepressant medicine helps people abstain from smoking, but how this works is unknown. This medicine is available by prescription.  Nicotinic receptor partial agonist medicine simulates the effect of nicotine in your brain. This medicine is available by prescription. Ask your caregiver for advice about which medicines to use and how to use them based on your health history. Your caregiver will tell you what side effects to look out for if you choose to be on a medicine or therapy. Carefully read the information on the package. Do not use any other product containing nicotine while using a nicotine replacement product.  RELAPSE OR DIFFICULT SITUATIONS Most relapses occur within the first 3 months after quitting. Do not be discouraged if you start smoking again. Remember, most people try several times before finally quitting. You may have symptoms of withdrawal because your body is used to nicotine. You may crave cigarettes, be irritable, feel very hungry, cough often, get headaches, or have difficulty concentrating. The withdrawal symptoms are only temporary. They are strongest when you first quit, but they will go away within 10 14 days. To reduce the chances of relapse, try to:  Avoid drinking alcohol. Drinking lowers your chances of successfully  quitting.  Reduce the amount of caffeine you consume. Once you quit smoking, the amount of caffeine in your body increases and can give you symptoms, such as a rapid heartbeat, sweating, and anxiety.  Avoid smokers because they can make you want to smoke.  Do not let weight gain distract you. Many smokers will gain weight when they quit, usually less than 10 pounds. Eat a healthy diet and stay active. You can always lose the weight gained after you quit.  Find ways to improve your mood other than smoking. FOR MORE INFORMATION  www.smokefree.gov    While it can be one of the most difficult things to do, the [place] community has programs to help you stop.  Consider talking  with your primary care physician about options.  Also, Smoking Cessation classes are available through the [facility]:  The smoking cessation program is a proven-effective program from the American Lung Association. The program is available for anyone 2218 and older who currently smokes. The program lasts for 7 weeks and is 8 sessions. Each class will be approximately 1 1/2 hours. The program is every Tuesday.  All classes are 12-1:30pm and same location.  Event Location Information:  Location: [facility] 2nd Floor Conference Room 2-037; located next to [facility]  Closest cross streets: [address] Entrance into the [facility] is adjacent to the [facility]'s main entrance. The conference room is located on the 2nd floor.  Parking Instructions: Visitor parking is adjacent to [facility]'s main entrance and the Cancer Center    A smoking cessation program is also offered through the [facility]. Register online at [email or web address] or call [phone] for more information.   Tobacco cessation counseling is available at [facility]. Call [phone] for a free appointment.   Tobacco cessation classes also are  available through the [facility] in [place]. For information, call [phone].   The Patient Education Network features videos on tobacco cessation. Please consult your listings in the center of this book to find instructions on how to access this resource.   If you want more information, ask your nurse.

## 2014-12-13 NOTE — Transfer of Care (Signed)
Immediate Anesthesia Transfer of Care Note  Patient: Alfred [name]  Procedure(s) Performed: Procedure(s): LAPAROSCOPIC ABDOMINAL EXPLORATION Omental patch for perforation and core liver biopsies (N/A)  Patient Location: PACU  Anesthesia Type:General  Level of Consciousness: awake, alert  and oriented  Airway & Oxygen Therapy: Patient Spontanous Breathing and Patient connected to face mask oxygen  Post-op Assessment: Report given to RN and Post -op Vital signs reviewed and stable  Post vital signs: Reviewed and stable  Last Vitals:  Filed Vitals:   12/12/14 2030  BP: 132/90  Pulse:   Temp:   Resp: 20    Complications: No apparent anesthesia complications

## 2014-12-14 ENCOUNTER — Encounter (HOSPITAL_COMMUNITY): Payer: Self-pay | Admitting: Surgery

## 2014-12-14 LAB — COMPREHENSIVE METABOLIC PANEL
ALT: 152 U/L — ABNORMAL HIGH (ref 17–63)
AST: 191 U/L — ABNORMAL HIGH (ref 15–41)
Albumin: 2.6 g/dL — ABNORMAL LOW (ref 3.5–5.0)
Alkaline Phosphatase: 78 U/L (ref 38–126)
Anion gap: 8 (ref 5–15)
BUN: 15 mg/dL (ref 6–20)
CALCIUM: 7.5 mg/dL — AB (ref 8.9–10.3)
CO2: 22 mmol/L (ref 22–32)
CREATININE: 0.78 mg/dL (ref 0.61–1.24)
Chloride: 102 mmol/L (ref 101–111)
GFR calc non Af Amer: >60 mL/min (ref >60)
Glucose, Bld: 82 mg/dL (ref 65–99)
Potassium: 4.1 mmol/L (ref 3.5–5.1)
Sodium: 132 mmol/L — ABNORMAL LOW (ref 135–145)
Total Bilirubin: 3.9 mg/dL — ABNORMAL HIGH (ref 0.3–1.2)
Total Protein: 5.7 g/dL — ABNORMAL LOW (ref 6.5–8.1)

## 2014-12-14 LAB — CBC
HCT: 33.9 % — ABNORMAL LOW (ref 39.0–52.0)
Hemoglobin: 11.6 g/dL — ABNORMAL LOW (ref 13.0–17.0)
MCH: 30.4 pg (ref 26.0–34.0)
MCHC: 34.2 g/dL (ref 30.0–36.0)
MCV: 89 fL (ref 78.0–100.0)
PLATELETS: 77 10*3/uL — AB (ref 150–400)
RBC: 3.81 MIL/uL — AB (ref 4.22–5.81)
RDW: 15.5 % (ref 11.5–15.5)
WBC: 12.4 10*3/uL — AB (ref 4.0–10.5)

## 2014-12-14 NOTE — Progress Notes (Signed)
Initial Nutrition Assessment  DOCUMENTATION CODES:  Non-severe (moderate) malnutrition in context of acute illness/injury  INTERVENTION: - Will assess for need of nutrition supplements with diet advancement versus need for nutrition support.  NUTRITION DIAGNOSIS:  Inadequate oral intake related to inability to eat as evidenced by NPO status  GOAL:  Patient will meet greater than or equal to 90% of their needs  MONITOR:  Diet advancement, Weight trends, Labs, I & O's  REASON FOR ASSESSMENT:  Malnutrition Screening Tool  ASSESSMENT: 64 year old male with worsening 2 day history of upper abdominal pain. Decreased appetite. Nauseated but not during up. Drinks a fifth of liquor and at least a sixpack of beer a day. Smokes about a pack of cigarettes a day. Has been admitted in the past for alcohol toxicity and delirium tremens. Upper abdominal pain concerning. Ultrasound showed some mild gallbladder wall thickening. No evidence of cholecystitis. Liver function tests all elevated. Concern for cholecystitis. Surgical consultation requested.  Pt seen for MST. BMI indicates normal weight status. Pt NPO and reports abdominal pain with deep inhalations and feels if he were to consume something he would vomit. Pt POD #1 ex lap with laparoscopic omental patch of perforated ulcer and core liver biopsies.   He indicates that PTA he had a poor appetite x1 week and the last thing he ate was 1/5 of a Whopper and he felt very nauseous after consumption. He states weight has been stable around his UBW of 165 lbs. Weight hx review indicates pt has lost 14 lbs (8% body weight) in unknown time frame from UBW.   Unable to meet needs. Physical assessment showed some upper body mild muscle wasting. Medications reviewed. Labs reviewed; Na: 132 mmol/L, Ca: 7.5 mg/dL, AST/ALT elevated.   Height:  Ht Readings from Last 1 Encounters:  12/13/14 6\' 2"  (1.88 m)    Weight:  Wt Readings from Last 1  Encounters:  12/13/14 151 lb 7.3 oz (68.7 kg)    Ideal Body Weight:  86.4 kg (kg)  Wt Readings from Last 10 Encounters:  12/13/14 151 lb 7.3 oz (68.7 kg)  09/07/13 148 lb (67.132 kg)    BMI:  Body mass index is 19.44 kg/(m^2).  Estimated Nutritional Needs:  Kcal:  [id number]  Protein:  75-85 grams  Fluid:  2.2 L/day  Skin:  Wound (see comment) (abdominal incision)  Diet Order:  Diet - low sodium heart healthy Diet NPO time specified Except for: Ice Chips  EDUCATION NEEDS:  No education needs identified at this time   Intake/Output Summary (Last 24 hours) at 12/14/14 0948 Last data filed at 12/14/14 0800  Gross per 24 hour  Intake   2700 ml  Output   1550 ml  Net   1150 ml    Last BM:  PTA    Trenton GammonJessica Padraig Nhan, RD, LDN Inpatient Clinical Dietitian Pager # [phone] After hours/weekend pager # [phone]

## 2014-12-14 NOTE — Evaluation (Signed)
Physical Therapy Evaluation Patient Details Name: Alfred [name] MRN: [id number] DOB: Mar 19, 1951 Today's Date: 12/14/2014   History of Present Illness   Admitted 12/11/14 with abdominal pain, H/O ETOH. Perforated duodenal bulb ulcer s/p lap omental patch 12/13/2014  Clinical Impression  Patient is very unsteady, shakey, wide base of support and mildly ataxic in standing. Patient will benefit from PT to address problems listed in note below. Disposition recommendations are uncertain, depends on recovery of functiona independence.     Follow Up Recommendations Supervision/Assistance - 24 hour;SNF (uncertain due to medicaid)    Equipment Recommendations  Rolling walker with 5" wheels    Recommendations for Other Services       Precautions / Restrictions Precautions Precautions: Fall Restrictions Weight Bearing Restrictions: No      Mobility  Bed Mobility Overal bed mobility: Needs Assistance Bed Mobility: Supine to Sit     Supine to sit: Min assist     General bed mobility comments: cues for safety , patient is tremulous  Transfers Overall transfer level: Needs assistance Equipment used: 2 person hand held assist Transfers: Sit to/from BJ'sStand;Stand Pivot Transfers Sit to Stand: Mod assist;+2 safety/equipment Stand pivot transfers: Mod assist;+2 safety/equipment       General transfer comment: wide base for standing, tending to lean posteriorly, very shakey standing  Ambulation/Gait                Stairs            Wheelchair Mobility    Modified Rankin (Stroke Patients Only)       Balance Overall balance assessment: Needs assistance Sitting-balance support: Bilateral upper extremity supported;Feet supported Sitting balance-Leahy Scale: Poor     Standing balance support: During functional activity;Bilateral upper extremity supported Standing balance-Leahy Scale: Poor                               Pertinent Vitals/Pain Pain  Assessment: Faces Faces Pain Scale: Hurts little more Pain Location: mid sternaum Pain Descriptors / Indicators: Discomfort Pain Intervention(s): Monitored during session    Home Living Family/patient expects to be discharged to:: Private residence Living Arrangements: Non-relatives/Friends Available Help at Discharge: Friend(s) Type of Home: House Home Access: Stairs to enter Entrance Stairs-Rails: Doctor, general practiceight;Left Entrance Stairs-Number of Steps: 5 Home Layout: One level Home Equipment: None      Prior Function Level of Independence: Independent               Hand Dominance        Extremity/Trunk Assessment   Upper Extremity Assessment: Generalized weakness           Lower Extremity Assessment: Generalized weakness;LLE deficits/detail;RLE deficits/detail RLE Deficits / Details: mildly  uncoordinated LLE Deficits / Details: same as R  Cervical / Trunk Assessment: Normal  Communication   Communication: No difficulties  Cognition Arousal/Alertness: Awake/alert Behavior During Therapy: Impulsive Overall Cognitive Status: Difficult to assess       Memory: Decreased recall of precautions              General Comments      Exercises        Assessment/Plan    PT Assessment Patient needs continued PT services  PT Diagnosis Difficulty walking;Generalized weakness;Altered mental status   PT Problem List Decreased strength;Decreased activity tolerance;Decreased balance;Decreased mobility;Decreased cognition;Decreased coordination;Cardiopulmonary status limiting activity;Decreased knowledge of precautions;Decreased safety awareness;Pain;Decreased knowledge of use of DME  PT Treatment Interventions DME instruction;Gait training;Functional mobility training;Therapeutic activities;Stair  training;Therapeutic exercise;Balance training;Patient/family education   PT Goals (Current goals can be found in the Care Plan section) Acute Rehab PT Goals Patient Stated  Goal: patient did not state PT Goal Formulation: Patient unable to participate in goal setting Time For Goal Achievement: 12/28/14 Potential to Achieve Goals: Good    Frequency Min 3X/week   Barriers to discharge Decreased caregiver support      Co-evaluation               End of Session   Activity Tolerance: Patient tolerated treatment well Patient left: in chair;with call bell/phone within reach;with chair alarm set Nurse Communication: Mobility status         Time: 1610-9604 PT Time Calculation (min) (ACUTE ONLY): 13 min   Charges:   PT Evaluation $Initial PT Evaluation Tier I: 1 Procedure     PT G CodesRada Hay 12/14/2014, 11:06 AM  Blanchard Kelch PT [phone]

## 2014-12-14 NOTE — Clinical Social Work Note (Addendum)
Clinical Social Work Assessment  Patient Details  Name: Alfred [name] MRN: [id number] Date of Birth: 1951/02/28  Date of referral:  12/14/14               Reason for consult:  Substance Use/ETOH Abuse                Permission sought to share information with:    Permission granted to share information::     Name::        Agency::     Relationship::     Contact Information:     Housing/Transportation Living arrangements for the past 2 months:  Single Family Home Source of Information:  Patient Patient Interpreter Needed:  None Criminal Activity/Legal Involvement Pertinent to Current Situation/Hospitalization:  No - Comment as needed Significant Relationships:  Friend / siblings Lives with:  Non-relative Do you feel safe going back to the place where you live?  Yes Need for family participation in patient care:  No (Coment)  Care giving concerns:  None   Social Worker assessment / plan:  Pt hospitalized on 12/12/14 perforated duodenal bulb ulcer. CSW consulted for SA ( ETOH ). PN notes reviewed. SBIRT completed. Pt reports daily alcohol use ( 5-6 drinks ). Pt reports that he has tried to stop drinking in the past but not recently. Pt reports that he had a DWI back in the 80's and went to AA for a short time. Pt reports that he is interested in SA assistance. Resource list provided to pt for review. Pt has CSW cell # to contact CSW for assistance arranging appt, if needed. Employment status:  Disabled (Comment on whether or not currently receiving Disability) Insurance information:  [place] PT Recommendations:  Not assessed at this time Information / Referral to community resources:  Outpatient Substance Abuse Treatment Options  Patient/Family's Response to care:  No family at bedside. Pt is willing to accept SA assistance.  Patient/Family's Understanding of and Emotional Response to Diagnosis, Current Treatment, and Prognosis:  Pt reports he understands the serious nature  of his illness and the need  to stop drinking. Pt reports he will contact CSW for further assistance.  Emotional Assessment Appearance:  Appears stated age Attitude/Demeanor/Rapport:  Other (cooperative) Affect (typically observed):  Calm, Appropriate, Pleasant Orientation:  Oriented to Self, Oriented to Place, Oriented to  Time, Oriented to Situation Alcohol / Substance use:  Alcohol Use Psych involvement (Current and /or in the community):  No (Comment)  Discharge Needs  Concerns to be addressed:  Substance Abuse Concerns Readmission within the last 30 days:  No Current discharge risk:  Substance Abuse Barriers to Discharge:  No Barriers Identified   Malikah Lakey, Dickey GaveJamie Lee, LCSW 12/14/2014, 10:48 AM

## 2014-12-14 NOTE — Progress Notes (Signed)
Patient ID: Alfred [name], male   DOB: 12-30-1950, 64 y.o.   MRN: [id number]     Alfred [name]      [address] [place], [place] [address]    Phone: [phone] FAX: [phone]     Subjective: S/o sore throat.  No flatus.  No n/v.  NGT output~263m.  Has not been OOB yet.   Objective:  Vital signs:  Filed Vitals:   12/14/14 0000 12/14/14 0050 12/14/14 0200 12/14/14 0400  BP:  136/84 133/74 132/87  Pulse:  94 90 94  Temp: 98.7 F (37.1 C)   98.3 F (36.8 C)  TempSrc: Oral   Oral  Resp:  '19 16 17  ' Height:      Weight:      SpO2:  100% 100% 97%    Last BM Date: 12/12/14  Intake/Output   Yesterday:  07/10 0701 - 07/11 0700 In: 2600 [P.O.:150; I.V.:2100; IV Piggyback:350] Out: 1250 [Urine:1050; Emesis/NG output:200] This shift:    I/O last 3 completed shifts: In: 4939.6 [P.O.:150; I.V.:4439.6; IV Piggyback:350] Out: 16384[Urine:1300; Emesis/NG output:460]    Physical Exam: General: Pt awake/alert/oriented x4 in no acute distress Chest: cta.  No chest wall pain w good excursion CV:  Pulses intact.  Regular rhythm MS: Normal AROM mjr joints.  No obvious deformity Abdomen: +BS. distended.  Incisions are c/d/i.  Mildly tender at incisions only.  No evidence of peritonitis.  No incarcerated hernias. Ext:  SCDs BLE.  No mjr edema.  No cyanosis Skin: No petechiae / purpura   Problem List:   Principal Problem:   Perforated duodenal bulb ulcer s/p lap omental patch 12/13/2014 Active Problems:   Protein-calorie malnutrition, moderate   Alcohol abuse   Cirrhosis, alcoholic   Tobacco abuse   Colonoscopy refused   Inguinal hernia, right   Family history of colon cancer (father)    Results:   Labs: Results for orders placed or performed during the hospital encounter of 12/12/14 (from the past 48 hour(s))  CBC with Differential/Platelet     Status: Abnormal   Collection Time: 12/12/14  4:21 PM  Result Value Ref  Range   WBC 3.2 (L) 4.0 - 10.5 K/uL   RBC 4.30 4.22 - 5.81 MIL/uL   Hemoglobin 13.2 13.0 - 17.0 g/dL   HCT 37.6 (L) 39.0 - 52.0 %   MCV 87.4 78.0 - 100.0 fL   MCH 30.7 26.0 - 34.0 pg   MCHC 35.1 30.0 - 36.0 g/dL   RDW 15.0 11.5 - 15.5 %   Platelets 117 (L) 150 - 400 K/uL    Comment: SPECIMEN CHECKED FOR CLOTS REPEATED TO VERIFY PLATELET COUNT CONFIRMED BY SMEAR    Neutrophils Relative % 73 43 - 77 %   Lymphocytes Relative 23 12 - 46 %   Monocytes Relative 3 3 - 12 %   Eosinophils Relative 0 0 - 5 %   Basophils Relative 1 0 - 1 %   Neutro Abs 2.4 1.7 - 7.7 K/uL   Lymphs Abs 0.7 0.7 - 4.0 K/uL   Monocytes Absolute 0.1 0.1 - 1.0 K/uL   Eosinophils Absolute 0.0 0.0 - 0.7 K/uL   Basophils Absolute 0.0 0.0 - 0.1 K/uL   RBC Morphology TARGET CELLS   Comprehensive metabolic panel     Status: Abnormal   Collection Time: 12/12/14  4:21 PM  Result Value Ref Range   Sodium 131 (L) 135 - 145 mmol/L   Potassium 3.6 3.5 -  5.1 mmol/L   Chloride 101 101 - 111 mmol/L   CO2 19 (L) 22 - 32 mmol/L   Glucose, Bld 87 65 - 99 mg/dL   BUN 8 6 - 20 mg/dL   Creatinine, Ser 0.64 0.61 - 1.24 mg/dL   Calcium 8.2 (L) 8.9 - 10.3 mg/dL   Total Protein 6.4 (L) 6.5 - 8.1 g/dL   Albumin 3.0 (L) 3.5 - 5.0 g/dL   AST 396 (H) 15 - 41 U/L   ALT 241 (H) 17 - 63 U/L   Alkaline Phosphatase 132 (H) 38 - 126 U/L   Total Bilirubin 3.2 (H) 0.3 - 1.2 mg/dL   GFR calc non Af Amer >60 >60 mL/min   GFR calc Af Amer >60 >60 mL/min    Comment: (NOTE) The eGFR has been calculated using the CKD EPI equation. This calculation has not been validated in all clinical situations. eGFR's persistently <60 mL/min signify possible Chronic Kidney Disease.    Anion gap 11 5 - 15  Lipase, blood     Status: None   Collection Time: 12/12/14  4:21 PM  Result Value Ref Range   Lipase 36 22 - 51 U/L  Ethanol     Status: Abnormal   Collection Time: 12/12/14  4:21 PM  Result Value Ref Range   Alcohol, Ethyl (B) 90 (H) <5 mg/dL     Comment:        LOWEST DETECTABLE LIMIT FOR SERUM ALCOHOL IS 5 mg/dL FOR MEDICAL PURPOSES ONLY   I-Stat CG4 Lactic Acid, ED     Status: Abnormal   Collection Time: 12/12/14  4:29 PM  Result Value Ref Range   Lactic Acid, Venous 2.55 (HH) 0.5 - 2.0 mmol/L   Comment NOTIFIED PHYSICIAN   Protime-INR     Status: None   Collection Time: 12/12/14  7:31 PM  Result Value Ref Range   Prothrombin Time 13.4 11.6 - 15.2 seconds   INR 1.00 0.00 - 1.49  Ammonia     Status: None   Collection Time: 12/12/14  7:31 PM  Result Value Ref Range   Ammonia 31 9 - 35 umol/L  MRSA PCR Screening     Status: None   Collection Time: 12/13/14  2:06 AM  Result Value Ref Range   MRSA by PCR NEGATIVE NEGATIVE    Comment:        The GeneXpert MRSA Assay (FDA approved for NASAL specimens only), is one component of a comprehensive MRSA colonization surveillance program. It is not intended to diagnose MRSA infection nor to guide or monitor treatment for MRSA infections.   Comprehensive metabolic panel     Status: Abnormal   Collection Time: 12/13/14  5:55 AM  Result Value Ref Range   Sodium 135 135 - 145 mmol/L   Potassium 4.9 3.5 - 5.1 mmol/L    Comment: DELTA CHECK NOTED REPEATED TO VERIFY NO VISIBLE HEMOLYSIS    Chloride 108 101 - 111 mmol/L   CO2 20 (L) 22 - 32 mmol/L   Glucose, Bld 105 (H) 65 - 99 mg/dL   BUN 13 6 - 20 mg/dL   Creatinine, Ser 0.95 0.61 - 1.24 mg/dL   Calcium 7.2 (L) 8.9 - 10.3 mg/dL   Total Protein 6.0 (L) 6.5 - 8.1 g/dL   Albumin 2.7 (L) 3.5 - 5.0 g/dL   AST 331 (H) 15 - 41 U/L   ALT 208 (H) 17 - 63 U/L   Alkaline Phosphatase 93 38 - 126 U/L  Total Bilirubin 4.4 (H) 0.3 - 1.2 mg/dL   GFR calc non Af Amer >60 >60 mL/min   GFR calc Af Amer >60 >60 mL/min    Comment: (NOTE) The eGFR has been calculated using the CKD EPI equation. This calculation has not been validated in all clinical situations. eGFR's persistently <60 mL/min signify possible Chronic Kidney Disease.     Anion gap 7 5 - 15  Lipase, blood     Status: None   Collection Time: 12/13/14  5:55 AM  Result Value Ref Range   Lipase 50 22 - 51 U/L  CBC     Status: Abnormal   Collection Time: 12/13/14  5:55 AM  Result Value Ref Range   WBC 10.7 (H) 4.0 - 10.5 K/uL   RBC 4.26 4.22 - 5.81 MIL/uL   Hemoglobin 12.8 (L) 13.0 - 17.0 g/dL   HCT 38.5 (L) 39.0 - 52.0 %   MCV 90.4 78.0 - 100.0 fL   MCH 30.0 26.0 - 34.0 pg   MCHC 33.2 30.0 - 36.0 g/dL   RDW 15.3 11.5 - 15.5 %   Platelets 90 (L) 150 - 400 K/uL    Comment: REPEATED TO VERIFY SPECIMEN CHECKED FOR CLOTS CONSISTENT WITH PREVIOUS RESULT   Comprehensive metabolic panel     Status: Abnormal   Collection Time: 12/14/14  3:40 AM  Result Value Ref Range   Sodium 132 (L) 135 - 145 mmol/L   Potassium 4.1 3.5 - 5.1 mmol/L   Chloride 102 101 - 111 mmol/L   CO2 22 22 - 32 mmol/L   Glucose, Bld 82 65 - 99 mg/dL   BUN 15 6 - 20 mg/dL   Creatinine, Ser 0.78 0.61 - 1.24 mg/dL   Calcium 7.5 (L) 8.9 - 10.3 mg/dL   Total Protein 5.7 (L) 6.5 - 8.1 g/dL   Albumin 2.6 (L) 3.5 - 5.0 g/dL   AST 191 (H) 15 - 41 U/L   ALT 152 (H) 17 - 63 U/L   Alkaline Phosphatase 78 38 - 126 U/L   Total Bilirubin 3.9 (H) 0.3 - 1.2 mg/dL   GFR calc non Af Amer >60 >60 mL/min   GFR calc Af Amer >60 >60 mL/min    Comment: (NOTE) The eGFR has been calculated using the CKD EPI equation. This calculation has not been validated in all clinical situations. eGFR's persistently <60 mL/min signify possible Chronic Kidney Disease.    Anion gap 8 5 - 15  CBC     Status: Abnormal   Collection Time: 12/14/14  3:40 AM  Result Value Ref Range   WBC 12.4 (H) 4.0 - 10.5 K/uL   RBC 3.81 (L) 4.22 - 5.81 MIL/uL   Hemoglobin 11.6 (L) 13.0 - 17.0 g/dL   HCT 33.9 (L) 39.0 - 52.0 %   MCV 89.0 78.0 - 100.0 fL   MCH 30.4 26.0 - 34.0 pg   MCHC 34.2 30.0 - 36.0 g/dL   RDW 15.5 11.5 - 15.5 %   Platelets 77 (L) 150 - 400 K/uL    Comment: REPEATED TO VERIFY SPECIMEN CHECKED FOR  CLOTS CONSISTENT WITH PREVIOUS RESULT     Imaging / Studies: Ct Abdomen Pelvis W Contrast  12/12/2014   CLINICAL DATA:  Acute onset of generalized abdominal pain. Rebound tenderness at the right upper quadrant. Initial encounter.  EXAM: CT ABDOMEN AND PELVIS WITH CONTRAST  TECHNIQUE: Multidetector CT imaging of the abdomen and pelvis was performed using the standard protocol following bolus administration of intravenous  contrast.  CONTRAST:  160m OMNIPAQUE IOHEXOL 300 MG/ML  SOLN  COMPARISON:  CT of the abdomen and pelvis performed 09/04/2013, and right upper quadrant ultrasound performed earlier today at 5:15 p.m.  FINDINGS: The visualized lung bases are clear.  A moderate to large amount of free air and free fluid are noted within the abdomen and pelvis. Soft tissue inflammation is noted tracking along both paracolic gutters. This is compatible with underlying bowel perforation. The location of perforation is not well characterized on this study.  Fluid is noted tracking about the gallbladder, with surrounding soft tissue inflammation. This could reflect mild cholecystitis, or could be reactive secondary to underlying bowel perforation and free fluid.  The liver and spleen are unremarkable in appearance. Scattered air is noted tracking about the liver. The pancreas and adrenal glands are unremarkable.  The kidneys are unremarkable in appearance. There is no evidence of hydronephrosis. No renal or ureteral stones are seen. No perinephric stranding is appreciated. Minimal calcification is noted at the distal abdominal aorta and its branches.  A moderate to large right inguinal hernia is seen, containing a long segment of distal ileum. No associated soft tissue inflammation is seen to suggest strangulation. Trace free air is noted within the hernia.  There is focal wall thickening at the antrum of the stomach. This appears grossly stable from 2015 and may reflect chronic inflammation. No acute vascular  abnormalities are seen.  The appendix is not well seen; there is no evidence of appendicitis. The colon is largely decompressed and is grossly unremarkable in appearance.  The bladder is mildly distended and grossly unremarkable. The prostate is normal in size. No inguinal lymphadenopathy is seen.  No acute osseous abnormalities are identified.  IMPRESSION: 1. Moderate to large amount of free air and free fluid within the abdomen and pelvis, tracking along the paracolic gutters. This is compatible with underlying bowel perforation. The location of perforation is not well characterized on this study. 2. Fluid tracking about the gallbladder, with surrounding soft tissue inflammation. This could reflect mild cholecystitis, or could be reactive secondary to underlying bowel perforation and free fluid. 3. Moderate to large right inguinal hernia, containing a long segment of distal ileum. No associated soft tissue inflammation seen to suggest strangulation. Trace free air noted within the hernia. 4. Focal wall thickening at the antrum of the stomach appears grossly stable from 2015 and may reflect chronic inflammation. Would correlate with the patient's symptoms; endoscopy could be considered on an elective nonemergent basis.  Critical Value/emergent results were called by telephone at the time of interpretation on 12/12/2014 at 9:24 pm to Dr. MErnestina Patches who verbally acknowledged these results.   Electronically Signed   By: JGarald BaldingM.D.   On: 12/12/2014 21:57   Dg Abd Acute W/chest  12/12/2014   CLINICAL DATA:  Two day history of abdominal pain increasing in severity over the past 2 hr.  EXAM: DG ABDOMEN ACUTE W/ 1V CHEST  COMPARISON:  Chest x-ray 09/04/2013  FINDINGS: The upright chest x-ray demonstrates normal cardiomediastinal contours. No acute pulmonary findings. Minimal streaky basilar atelectasis. There is moderate free air noted in the abdomen.  Two views of the abdomen confirm large amount of free  intraperitoneal air consistent with hollow viscus perforation. The soft tissue shadows of the abdomen are maintained. No worrisome calcifications. The bony structures are intact.  IMPRESSION: Free intraperitoneal air is noted, consistent with hollow viscus perforation.  These results were called by telephone at the time of interpretation  on 12/12/2014 at 8:16 pm to Dr. Ernestina Patches , who verbally acknowledged these results.   Electronically Signed   By: Marijo Sanes M.D.   On: 12/12/2014 20:16   US Abdomen Limited Ruq  12/12/2014   CLINICAL DATA:  Acute onset of right upper quadrant abdominal pain. Initial encounter.  EXAM: US ABDOMEN LIMITED - RIGHT UPPER QUADRANT  COMPARISON:  CT of the abdomen and pelvis performed 09/04/2013  FINDINGS: Gallbladder:  Mild gallbladder wall thickening is noted. This may reflect mild chronic inflammation. No stones are identified. No pericholecystic fluid is seen. No ultrasonographic Murphy's sign is elicited.  Common bile duct:  Diameter: 0.3 cm, within normal limits in caliber.  Liver:  No focal lesion identified. Diffusely nodular contour raises concern for hepatic cirrhosis, better characterized than on the prior study. Mildly increased parenchymal echogenicity could reflect mild fatty infiltration.  IMPRESSION: 1. Mild gallbladder wall thickening may reflect mild chronic inflammation. No stones seen. No evidence for obstruction or acute cholecystitis. 2. Diffusely nodular hepatic contour raises concern for hepatic cirrhosis, better characterized than on the prior study. Mild fatty infiltration within the liver.   Electronically Signed   By: Garald Balding M.D.   On: 12/12/2014 18:23    Medications / Allergies:  Scheduled Meds: . antiseptic oral rinse  7 mL Mouth Rinse BID  . heparin  5,000 Units Subcutaneous 3 times per day  . lip balm  1 application Topical BID  . LORazepam  0-4 mg Intravenous 4 times per day  . [START ON 12/15/2014] LORazepam  0-4 mg Intravenous  Q12H  . pantoprazole (PROTONIX) IV  80 mg Intravenous Q12H  . piperacillin-tazobactam (ZOSYN)  IV  3.375 g Intravenous Q8H  . thiamine IV  100 mg Intravenous Daily  . thiamine  100 mg Oral Daily   Continuous Infusions: . lactated ringers 100 mL/hr at 12/14/14 0712   PRN Meds:.acetaminophen, alum & mag hydroxide-simeth, bisacodyl, diphenhydrAMINE, HYDROmorphone (DILAUDID) injection, lactated ringers, magic mouthwash, menthol-cetylpyridinium, methocarbamol (ROBAXIN)  IV, metoprolol, ondansetron (ZOFRAN) IV **OR** ondansetron (ZOFRAN) IV, phenol, promethazine  Antibiotics: Anti-infectives    Start     Dose/Rate Route Frequency Ordered Stop   12/13/14 1000  piperacillin-tazobactam (ZOSYN) IVPB 3.375 g     3.375 g 12.5 mL/hr over 240 Minutes Intravenous Every 8 hours 12/13/14 0940 12/20/14 0959   12/12/14 2245  clindamycin (CLEOCIN) 900 mg, gentamicin (GARAMYCIN) 240 mg in sodium chloride 0.9 % 1,000 mL for intraperitoneal lavage  Status:  Discontinued    Comments:  Pharmacy may adjust dosing strength, schedule, rate of infusion, etc as needed to optimize therapy    Intraperitoneal To Surgery 12/12/14 2238 12/13/14 0205   12/12/14 2030  piperacillin-tazobactam (ZOSYN) IVPB 3.375 g     3.375 g 100 mL/hr over 30 Minutes Intravenous  Once 12/12/14 2020 12/12/14 2140        Assessment/Plan POD#2 laparoscopic omental patch of perforated duodenal bulb ulcer and core liver biopsy---Dr. Johney Maine  -stable, continue NGT until flatus, then UGI to r/o leak.  If okay, then clamping trial. -Protonix 98m BID, then 427mBID when tolerating PO x8 weeks -will need EGD in 6-8 weeks to ensure ulcer has healed v. Biopsy.  Also needs a screening colonoscopy at this time. -pain control, mobilize, pulmonary toilet -DC foley -will remove dressing tomorrow ID-zosyn D#2/7.  Follow h pylori and treat if positive.  Abnormal LFTs-liver biopsy is pending.  GI eval if LFTs worsen EtOH/Tobacco use-CIWA, cessation   VTE prophylaxis-SCD/heparin FEN-NPO, IVF Dispo-to floor  Erby Pian, [facility] Surgery Pager [phone])   12/14/2014 7:49 AM

## 2014-12-15 ENCOUNTER — Inpatient Hospital Stay (HOSPITAL_COMMUNITY): Payer: Medicaid Other

## 2014-12-15 LAB — H. PYLORI ANTIBODY, IGG: H Pylori IgG: <0.9 U/mL (ref 0.0–0.8)

## 2014-12-15 NOTE — Progress Notes (Signed)
Advanced NG tube by 10 cm. Auscultated placement. Patient tolerated well. Will continue to monitor.

## 2014-12-15 NOTE — Progress Notes (Signed)
Checked placement for pt's NG tube. Very diminished. And 0 output in canister. Additionally pt's diastolic pressure has been elevated. Paged MD on call to notify. No new orders received at this time. MD suggested to continue to monitor pt and to call with any changes. Pt is currently calm and resting. Pt reports no complaints of nausea or discomfort. Will continue to monitor.

## 2014-12-15 NOTE — Progress Notes (Signed)
CSW assisting with d/c planning. PN reviewed. PT worked with pt yesterday and reports SNF placement may be needed depending on how pt progresses. CSW met with pt to review PT recommendations. Pt is willing to consider possible ST Rehab, if needed at d/c. Pt remains medically unstable and d/c date is undetermined. CSW will continue to follow and assist with d/c planning, as needed.  Werner Lean  LCSW [phone]

## 2014-12-15 NOTE — Progress Notes (Signed)
3 Days Post-Op  Subjective: Nothing coming from NG, sounds like it's in stomach but he coughed some with irrigation, so I will check with films.  He does report some flatus, some shakes, but better..  Still distended.  Objective: Vital signs in last 24 hours: Temp:  [98 F (36.7 C)-98.9 F (37.2 C)] 98.7 F (37.1 C) (07/12 0559) Pulse Rate:  [94-100] 96 (07/12 0559) Resp:  [18-22] 20 (07/12 0559) BP: (122-145)/(82-100) 122/87 mmHg (07/12 0559) SpO2:  [93 %-98 %] 96 % (07/12 0559) Last BM Date: 12/12/14 PO 60 ml 150 from the NG Afebrile, VSS, BP up some yesterday No labs today, WBC still up yesterday Pathology still pending Intake/Output from previous day: 07/11 0701 - 07/12 0700 In: 2130 [P.O.:60; I.V.:1500; NG/GT:150; IV Piggyback:150] Out: 1100 [Urine:950; Emesis/NG output:150] Intake/Output this shift:    General appearance: alert, cooperative, no distress and a little tremulous. Resp: BS down in both base, better after coughing, no rales or wheezing GI: distended, few BS, port sites dressing are dry and clean, dressing still in place.  Lab Results:   Recent Labs  12/13/14 0555 12/14/14 0340  WBC 10.7* 12.4*  HGB 12.8* 11.6*  HCT 38.5* 33.9*  PLT 90* 77*    BMET  Recent Labs  12/13/14 0555 12/14/14 0340  NA 135 132*  K 4.9 4.1  CL 108 102  CO2 20* 22  GLUCOSE 105* 82  BUN 13 15  CREATININE 0.95 0.78  CALCIUM 7.2* 7.5*   PT/INR  Recent Labs  12/12/14 1931  LABPROT 13.4  INR 1.00     Recent Labs Lab 12/12/14 1621 12/13/14 0555 12/14/14 0340  AST 396* 331* 191*  ALT 241* 208* 152*  ALKPHOS 132* 93 78  BILITOT 3.2* 4.4* 3.9*  PROT 6.4* 6.0* 5.7*  ALBUMIN 3.0* 2.7* 2.6*     Lipase     Component Value Date/Time   LIPASE 50 12/13/2014 0555     Studies/Results: No results found.  Medications: . antiseptic oral rinse  7 mL Mouth Rinse BID  . heparin  5,000 Units Subcutaneous 3 times per day  . lip balm  1 application Topical BID   . LORazepam  0-4 mg Intravenous Q12H  . pantoprazole (PROTONIX) IV  80 mg Intravenous Q12H  . piperacillin-tazobactam (ZOSYN)  IV  3.375 g Intravenous Q8H  . thiamine IV  100 mg Intravenous Daily  . thiamine  100 mg Oral Daily   . lactated ringers 100 mL/hr at 12/15/14 [id number]    Assessment/Plan Perforated duodenal bulb ulcer S/p LAPAROSCOPIC ABDOMINAL EXPLORATION, Laparoscopic Omental patch of perforated ulcer, Core liver biopsies, 12/13/14 Dr. Karie SodaSteven Gross POD# 2 H Pylori pending Cirrhosis - elevated LFT's ETOH abuse, with hx of DT's ETOH toxicity  (pint + 6 pack per day pre op) Tobacco use [facility] Antibiotics:  Day 4/7  DVT:  Heparin/SCD  Plan:  NGT until flatus, then UGI to r/o leak/ostruction at ulcer/omental patch. If OK, then clamping trial (?2-5 days from now?)  SG note from 12/13/14 Check 3 way film just to make sure he has NG properly positioned.   If OK will get UGI today or tomorrow.  Nothing coming from the NG now.  Path still pending. Recheck labs in AM. Film is OK we advanced tube some, even though it looked OK. I did not get a UGI today because he is so distended.  Will decide on this tomorrow.     LOS: 2 days    Cecile Guevara 12/15/2014

## 2014-12-16 ENCOUNTER — Inpatient Hospital Stay (HOSPITAL_COMMUNITY): Payer: Medicaid Other

## 2014-12-16 LAB — BASIC METABOLIC PANEL
Anion gap: 11 (ref 5–15)
BUN: 17 mg/dL (ref 6–20)
CO2: 23 mmol/L (ref 22–32)
Calcium: 8.1 mg/dL — ABNORMAL LOW (ref 8.9–10.3)
Chloride: 102 mmol/L (ref 101–111)
Creatinine, Ser: 0.77 mg/dL (ref 0.61–1.24)
GFR calc Af Amer: >60 mL/min (ref >60)
GFR calc non Af Amer: >60 mL/min (ref >60)
GLUCOSE: 69 mg/dL (ref 65–99)
Potassium: 3.3 mmol/L — ABNORMAL LOW (ref 3.5–5.1)
SODIUM: 136 mmol/L (ref 135–145)

## 2014-12-16 LAB — HEPATIC FUNCTION PANEL
ALT: 106 U/L — AB (ref 17–63)
AST: 120 U/L — ABNORMAL HIGH (ref 15–41)
Albumin: 2.8 g/dL — ABNORMAL LOW (ref 3.5–5.0)
Alkaline Phosphatase: 69 U/L (ref 38–126)
BILIRUBIN DIRECT: 1.7 mg/dL — AB (ref 0.1–0.5)
BILIRUBIN TOTAL: 3.7 mg/dL — AB (ref 0.3–1.2)
Indirect Bilirubin: 2 mg/dL — ABNORMAL HIGH (ref 0.3–0.9)
Total Protein: 6.4 g/dL — ABNORMAL LOW (ref 6.5–8.1)

## 2014-12-16 LAB — CBC
HCT: 32.6 % — ABNORMAL LOW (ref 39.0–52.0)
Hemoglobin: 11 g/dL — ABNORMAL LOW (ref 13.0–17.0)
MCH: 30.1 pg (ref 26.0–34.0)
MCHC: 33.7 g/dL (ref 30.0–36.0)
MCV: 89.3 fL (ref 78.0–100.0)
PLATELETS: 128 10*3/uL — AB (ref 150–400)
RBC: 3.65 MIL/uL — ABNORMAL LOW (ref 4.22–5.81)
RDW: 15.6 % — AB (ref 11.5–15.5)
WBC: 8.5 10*3/uL (ref 4.0–10.5)

## 2014-12-16 LAB — GLUCOSE, CAPILLARY
GLUCOSE-CAPILLARY: 93 mg/dL (ref 65–99)
Glucose-Capillary: 59 mg/dL — ABNORMAL LOW (ref 65–99)

## 2014-12-16 LAB — CLOSTRIDIUM DIFFICILE BY PCR: CDIFFPCR: NEGATIVE

## 2014-12-16 MED ORDER — PROMETHAZINE HCL 25 MG/ML IJ SOLN
6.2500 mg | INTRAMUSCULAR | Status: DC | PRN
Start: 2014-12-16 — End: 2014-12-26

## 2014-12-16 MED ORDER — DEXTROSE 50 % IV SOLN
INTRAVENOUS | Status: AC
  Administered 2014-12-16: 17:00:00
  Filled 2014-12-16: qty 50

## 2014-12-16 MED ORDER — LORAZEPAM 2 MG/ML IJ SOLN
0.0000 mg | INTRAMUSCULAR | Status: AC
  Administered 2014-12-16 – 2014-12-17 (×3): 2 mg via INTRAVENOUS
  Administered 2014-12-17 (×2): 1 mg via INTRAVENOUS
  Filled 2014-12-16 (×5): qty 1

## 2014-12-16 MED ORDER — KCL-LACTATED RINGERS-D5W 20 MEQ/L IV SOLN
INTRAVENOUS | Status: AC
  Administered 2014-12-16 – 2014-12-17 (×4): via INTRAVENOUS
  Filled 2014-12-16 (×6): qty 1000

## 2014-12-16 MED ORDER — IOHEXOL 300 MG/ML  SOLN
150.0000 mL | Freq: Once | INTRAMUSCULAR | Status: AC | PRN
  Administered 2014-12-16: 150 mL via ORAL

## 2014-12-16 MED ORDER — LIP MEDEX EX OINT
TOPICAL_OINTMENT | CUTANEOUS | Status: AC
  Filled 2014-12-16: qty 7

## 2014-12-16 MED ORDER — LORAZEPAM 2 MG/ML IJ SOLN
2.0000 mg | Freq: Once | INTRAMUSCULAR | Status: AC
  Administered 2014-12-16: 2 mg via INTRAVENOUS
  Filled 2014-12-16: qty 1

## 2014-12-16 MED ORDER — KCL-LACTATED RINGERS 20 MEQ/L IV SOLN
INTRAVENOUS | Status: DC
  Administered 2014-12-16: 09:00:00 via INTRAVENOUS
  Filled 2014-12-16 (×2): qty 1000

## 2014-12-16 NOTE — Progress Notes (Signed)
Physical Therapy Treatment Patient Details Name: Alfred [name] MRN: [id number] DOB: 28-Nov-1950 Today's Date: 12/16/2014    History of Present Illness  Admitted 12/11/14 with abdominal pain, H/O ETOH. Perforated duodenal bulb ulcer s/p lap omental patch 12/13/2014    PT Comments    Pt restless in bed alert to self.  Impulsive.  Assisted OOB to amb in hallway.  Assisted back to bed pt stated he needed to use bathroom.  Loose stools in bed.  Assisted with hygiene and amb to BR.  Assisted with hygiene again then amb back to bed.  Pt had near backward in bathroom during sit to stand from commode.  Therapist recovered.  Poor reactive response from pt.  Very unsteady.  HIGH FALL RISK.  Assisted back to bed and activated bed alarm.    Follow Up Recommendations  SNF     Equipment Recommendations       Recommendations for Other Services       Precautions / Restrictions Precautions Precautions: Fall Restrictions Weight Bearing Restrictions: No    Mobility  Bed Mobility Overal bed mobility: Needs Assistance Bed Mobility: Supine to Sit;Sit to Supine     Supine to sit: Min assist Sit to supine: Min assist   General bed mobility comments: 100% VC's for safety with lines.  Pt impulsive. Quck to move.   Transfers Overall transfer level: Needs assistance Equipment used: Rolling walker (2 wheeled);None Transfers: Sit to/from Stand Sit to Stand: Min assist;Mod assist;Max assist         General transfer comment: 100% VC's on safety with turns, negociating around obsticles, tripping hazzards.  Very unsteady/impulsive. Pt required any where from Min to Mod to Max assist.    Ambulation/Gait Ambulation/Gait assistance: Min assist;Mod assist;Max assist Ambulation Distance (Feet): 250 Feet Assistive device: Rolling walker (2 wheeled)       General Gait Details: used a RW for increased safety and to keep pt from grabbing random obsticles.  Very unsteady/drunken gait.  Poor safety  cognition.  Poor balance.  HIGH FALL RISK. amb in hallway and to and from bathroom.   Stairs            Wheelchair Mobility    Modified Rankin (Stroke Patients Only)       Balance                                    Cognition Arousal/Alertness: Awake/alert Behavior During Therapy: Impulsive Overall Cognitive Status: Difficult to assess                      Exercises      General Comments        Pertinent Vitals/Pain Pain Assessment: No/denies pain    Home Living                      Prior Function            PT Goals (current goals can now be found in the care plan section) Progress towards PT goals: Progressing toward goals    Frequency  Min 3X/week    PT Plan      Co-evaluation             End of Session Equipment Utilized During Treatment: Gait belt Activity Tolerance: Patient tolerated treatment well Patient left: in bed;with call bell/phone within reach;with bed alarm set     Time: 4098-1191  PT Time Calculation (min) (ACUTE ONLY): 55 min  Charges:  $Gait Training: 23-37 mins $Therapeutic Activity: 23-37 mins                    G Codes:      Felecia ShellingLori Ellen Goris  PTA [facility]  Acute  Rehab Pager      [phone]

## 2014-12-16 NOTE — Progress Notes (Signed)
4 Days Post-Op  Subjective: Very confused last PM, pulled NG out some and the nurse replaced the NG and checked with xray, he says he's having allot of flatus and + BM this AM.  His shakes are better.  May be sundowning as opposed to DT's. Abdomen softer today, and port sites look very good.  Objective: Vital signs in last 24 hours: Temp:  [98 F (36.7 C)-98.7 F (37.1 C)] 98 F (36.7 C) (07/13 0459) Pulse Rate:  [83-96] 83 (07/13 0459) Resp:  [18] 18 (07/13 0459) BP: (131-143)/(84-90) 143/85 mmHg (07/13 0459) SpO2:  [91 %-97 %] 91 % (07/13 0459) Last BM Date: 12/12/14 PO 120 NPO NG 350 Urine 950 Afebrile, BP up some K+ low will replace WBC better, platelets improving  Intake/Output from previous day: 07/12 0701 - 07/13 0700 In: 3715 [P.O.:120; I.V.:3200; NG/GT:25; IV Piggyback:250] Out: 1300 [Urine:950; Emesis/NG output:350] Intake/Output this shift:    General appearance: alert, cooperative and no distress Resp: clear to auscultation bilaterally and after cough GI: soft, less distended, less tender today, + BS and ports all look good.  Lab Results:   Recent Labs  12/14/14 0340 12/16/14 0403  WBC 12.4* 8.5  HGB 11.6* 11.0*  HCT 33.9* 32.6*  PLT 77* 128*    BMET  Recent Labs  12/14/14 0340 12/16/14 0403  NA 132* 136  K 4.1 3.3*  CL 102 102  CO2 22 23  GLUCOSE 82 69  BUN 15 17  CREATININE 0.78 0.77  CALCIUM 7.5* 8.1*   PT/INR No results for input(s): LABPROT, INR in the last 72 hours.   Recent Labs Lab 12/12/14 1621 12/13/14 0555 12/14/14 0340  AST 396* 331* 191*  ALT 241* 208* 152*  ALKPHOS 132* 93 78  BILITOT 3.2* 4.4* 3.9*  PROT 6.4* 6.0* 5.7*  ALBUMIN 3.0* 2.7* 2.6*     Lipase     Component Value Date/Time   LIPASE 50 12/13/2014 0555     Studies/Results: Dg Abd 1 View  12/16/2014   CLINICAL DATA:  NG tube placement  EXAM: ABDOMEN - 1 VIEW  COMPARISON:  12/15/2014  FINDINGS: Enteric tube tip is in the left upper quadrant  consistent with location in the upper stomach. Probable small left pleural effusion. Atelectasis in the lung bases.  IMPRESSION: Enteric tube tip in the left upper quadrant consistent with location in the upper stomach.   Electronically Signed   By: Burman NievesWilliam  Stevens M.D.   On: 12/16/2014 06:25   Dg Abd Acute W/chest  12/15/2014   CLINICAL DATA:  Assess nasogastric tube positioning. The patient experienced coughing duringNG tube irrigation.  EXAM: DG ABDOMEN ACUTE W/ 1V CHEST  COMPARISON:  Chest x-ray of December 12, 2014  FINDINGS: The in nasogastric tube tip projects in the region of the proximal gastric fundus. The proximal port lies just below the expected location of the GE junction.  There is bibasilar atelectasis or infiltrate. There is a small left pleural effusion and trace right pleural effusion. The heart and pulmonary vascularity are normal.  IMPRESSION: 1. The nasogastric tube positioning is reasonable though advancement by an additional 10 cm would assure that the proximal port remains below the GE junction. 2. There is bibasilar atelectasis and small bilateral pleural effusions greater on the left than on the right.   Electronically Signed   By: David  SwazilandJordan M.D.   On: 12/15/2014 09:06    Medications: . antiseptic oral rinse  7 mL Mouth Rinse BID  . heparin  5,000  Units Subcutaneous 3 times per day  . lip balm  1 application Topical BID  . LORazepam  0-4 mg Intravenous Q12H  . pantoprazole (PROTONIX) IV  80 mg Intravenous Q12H  . piperacillin-tazobactam (ZOSYN)  IV  3.375 g Intravenous Q8H  . thiamine IV  100 mg Intravenous Daily  . thiamine  100 mg Oral Daily    Continuous Infusions: . lactated ringers 100 mL/hr (12/16/14 0032)   PRN Meds:.acetaminophen, alum & mag hydroxide-simeth, bisacodyl, diphenhydrAMINE, HYDROmorphone (DILAUDID) injection, magic mouthwash, menthol-cetylpyridinium, methocarbamol (ROBAXIN)  IV, metoprolol, ondansetron (ZOFRAN) IV **OR** ondansetron (ZOFRAN) IV,  phenol, promethazine  Assessment/Plan Perforated duodenal bulb ulcer S/p LAPAROSCOPIC ABDOMINAL EXPLORATION, Laparoscopic Omental patch of perforated ulcer, Core liver biopsies, 12/13/14 Dr. Karie Soda POD# 2 H Pylori is negative Cirrhosis - elevated LFT's Liver, needle/core biopsy - STEATOHEPATITIS AND EARLY CIRRHOSIS, ETOH abuse, with hx of DT's ETOH toxicity (pint + 6 pack per day pre op) Tobacco use [facility] Antibiotics: Day 4/7  DVT: Heparin/SCD    Plan:  Dr.Gross would like to get Medicine to see and work on setting him up for post hospital care links.  We discussed tobacco and ETOH briefly.  We will get UGI today and see if we can't pull NG, and get his O2 off.  I am increasing his IV fluids and adding K+ to it to replace K+.  Work on mobilizing more.  If UGI OK we will start some clears.  LOS: 3 days    Melik Blancett 12/16/2014

## 2014-12-16 NOTE — Progress Notes (Signed)
Hypoglycemic Event  CBG: 59  Treatment: D50 IV 25 mL  Symptoms: Nervous/irritable  Follow-up CBG: Time:1655 CBG Result:93  Possible Reasons for Event: Inadequate meal intake  Comments/MD notified: Short, MD. D5 added to IV fluids    Cyd Hostler L  Remember to initiate Hypoglycemia Order Set & complete

## 2014-12-16 NOTE — Progress Notes (Signed)
Safety sitter requested this am, requested again to CN

## 2014-12-16 NOTE — Progress Notes (Signed)
Pt becoming increasingly impulsive and restless. Pt not able to stay in chair or bed without trying to immediately climb out.  Pt more disoriented than previous assessment.  Only able to tell me his name. Low bed and floot mat applied.  Attempted to get a Recruitment consultantsafety sitter, none available. Paged Short, MD about getting an order for a soft waist belt to help keep pt from falling. Orders received.  Will administer Ativan 2mg  once and apply soft waist belt.  Will also transfer to stepdown.  Alfred [name], Alfred [name]

## 2014-12-16 NOTE — Progress Notes (Signed)
Called to room and noted NGT pulled back slightly from Left Nare. Patient states he does not know what happened. NGT advance with patient in high fowlers position. Placement checked. KUB order placed for NGT advancement.

## 2014-12-16 NOTE — Progress Notes (Signed)
Pt pulled out NG tube this am, will jennings PA notified, sts to leave tube out

## 2014-12-16 NOTE — Consult Note (Addendum)
[facility] Medical Consultation  Alfred [name] ZOX:[id number] DOB: [birth year] DOA: 12/12/2014 PCP: No PCP Per Patient   Requesting physician: Luretha Murphy Date of consultation:  12/16/2014 Reason for consultation:  Medical management of cirrhosis, alcohol abuse  Impression/Recommendations  Perforated duodenal ulcer status post laparoscopic omental patch on 12/13/2014 -  H. Pylori negative -  Counseled EtOH and NSAID avoidance -  Continue BID PPI at discharge -  F/u with GI scheduled by myself today with Dr. Randa Evens -  General surgery managing this primarily  Alcoholic cirrhosis with liver biopsy performed on 12/13/2014.  Hyperbilirubinemia and transaminitis resolving. Does have mild thrombocytopenia but this was more likely related to acute illness since it is quickly resolving.  coags are normal.   -  Avoid tylenol and tylenol containing medications -  Check hepatitis A, B, C serologies -  Repeat LFTs today -  Vaccination for hepatitis A and B if not already immune -  Counseled EtOH cessation and added cirrhosis education to discharge paperwork  Alcohol withdrawal with rising CIWA scores and BP rising also -  CIWA -  Continue thiamine, folate -  IF scores continue to increase, increase frequency of CIWA scores to q2h and consider transfer to stepdown  - unable to educate at this time -  SW consult placed > please provide information about community resources once patient more oriented  Delirium, clearly confused at the time of my exam -  Check CBG -  Replete potassium -  Monitor for underlying infection (afebrile and no leukocytosis) -  Continue treatment for alcohol withdrawal  Elevated BP -  Start prn metoprolol for high blood pressure  Hypokalemia -  Check magnesium -  Agree with IV supplementation  Hypoglycemia this AM likely because NPO -  Check CBG -  Hypoglycemia protocol -  Add dextrose to IVF  Normocytic anemia -  Iron studies, B12, folate -  TSH -   Repeat hgb in AM  Mild thrombocytopenia, acute phase reactant and resolving -  Repeat CBC in AM  Mildly prolonged QTc -  D/c zofran and use phenergan instead -  F/u magnesium level  Moderate protein calorie malnutrition -  Resume diet ASAP with supplements -  Add dextrose to IVF  I have requested the patient be scheduled for community health and wellness clinic by the case manager.  I will followup again tomorrow. Please contact me if I can be of assistance in the meanwhile. Thank you for this consultation.  Chief Complaint: abdominal pain  HPI:   The patient is a 64 year old male with history of alcohol and tobacco abuse who presented with a 2 day history of upper abdominal pain, nausea, anorexia.  He had severe aching right upper quadrant pain that became progressively worse and therefore he came to the hospital.  CT scan demonstrated moderate to large amount of free air and free fluid within the abdomen and pelvis tracking along the paracolic gutters consistent with bowel perforation. He also had some soft tissue inflammation around the gallbladder and his liver function tests were elevated concerning for possible cholecystitis. He was found to have a moderate to large right inguinal hernia and also some focal wall thickening of the antrum of the stomach which appeared stable from 2015.  He was admitted by general surgery and underwent exploratory laparoscopy. He was found to have a perforated duodenal ulcer and had laparoscopic omental patch of his perforated ulcer as well as core liver biopsies. His postoperative course has been complicated by DTs.  His liver biopsy demonstrated early cirrhosis and hepatic steatosis consistent with alcohol abuse. Internal medicine has been consulted secondary to the finding of cirrhosis and the need for follow-up.  Review of Systems:  Patient delirious, unable to accurately obtain at this time.     Past Medical History  Diagnosis Date  . Delirium  tremens 09/10/2013  . Dental abscess 09/10/2013  . Knee effusion, left 09/10/2013  . Mental status change 09/10/2013   Past Surgical History  Procedure Laterality Date  . Laparoscopic abdominal exploration N/A 12/12/2014    Procedure: LAPAROSCOPIC ABDOMINAL EXPLORATION Omental patch for perforation and core liver biopsies;  Surgeon: Karie SodaSteven Gross, MD;  Location: [facility];  Service: General;  Laterality: N/A;   Social History:  reports that he has been smoking Cigarettes.  He has been smoking about 1.00 pack per day. He does not have any smokeless tobacco history on file. He reports that he drinks alcohol. He reports that he does not use illicit drugs.  Allergies  Allergen Reactions  . Nsaids Other (See Comments)    Perforated ulcer   History reviewed. No pertinent family history.    Prior to Admission medications   Medication Sig Start Date End Date Taking? Authorizing Provider  pantoprazole (PROTONIX) 40 MG tablet Take 1 tablet (40 mg total) by mouth 2 (two) times daily. 12/13/14   Karie SodaSteven Gross, MD  thiamine 100 MG tablet Take 1 tablet (100 mg total) by mouth daily. Patient not taking: Reported on 12/12/2014 09/10/13   Clydia LlanoMutaz Elmahi, MD   Physical Exam: Blood pressure 143/85, pulse 83, temperature 98 F (36.7 C), temperature source Oral, resp. rate 18, height 6\' 2"  (1.88 m), weight 68.7 kg (151 lb 7.3 oz), SpO2 91 %. Filed Vitals:   12/15/14 0559 12/15/14 1400 12/15/14 2216 12/16/14 0459  BP: 122/87 140/84 131/90 143/85  Pulse: 96 96 92 83  Temp: 98.7 F (37.1 C) 98.5 F (36.9 C) 98.7 F (37.1 C) 98 F (36.7 C)  TempSrc: Oral Oral Oral Oral  Resp: 20 18 18 18   Height:      Weight:      SpO2: 96% 97% 95% 91%     General:  Adult male, confused and exposing himself and trying to get out of bed during my exam  Eyes:  PERRL, anicteric, non-injected.  ENT:  Nares clear.  OP clear, non-erythematous without plaques or exudates.  MMM.  Neck:  Supple without TM or JVD.    Lymph:  No  cervical, supraclavicular, or submandibular LAD.  Cardiovascular:  RRR, normal S1, S2, without m/r/g.  2+ pulses, warm extremities  Respiratory:  CTA bilaterally without increased WOB.  Abdomen:  NABS.  Soft, minimally distended, port sites with some bruising and mild erythema without wound dehiscence, induration, or drainage.      Skin:  No rashes or focal lesions.  Musculoskeletal:  Normal bulk and tone.  No LE edema.  Psychiatric:  A & O to person only.  Talking nonsensically at times Neurologic:  Grossly moves all extremities, trying to get out of bed  Labs on Admission:  Basic Metabolic Panel:  Recent Labs Lab 12/12/14 1621 12/13/14 0555 12/14/14 0340 12/16/14 0403  NA 131* 135 132* 136  K 3.6 4.9 4.1 3.3*  CL 101 108 102 102  CO2 19* 20* 22 23  GLUCOSE 87 105* 82 69  BUN 8 13 15 17   CREATININE 0.64 0.95 0.78 0.77  CALCIUM 8.2* 7.2* 7.5* 8.1*   Liver Function Tests:  Recent Labs  Lab 12/12/14 1621 12/13/14 0555 12/14/14 0340  AST 396* 331* 191*  ALT 241* 208* 152*  ALKPHOS 132* 93 78  BILITOT 3.2* 4.4* 3.9*  PROT 6.4* 6.0* 5.7*  ALBUMIN 3.0* 2.7* 2.6*    Recent Labs Lab 12/12/14 1621 12/13/14 0555  LIPASE 36 50    Recent Labs Lab 12/12/14 1931  AMMONIA 31   CBC:  Recent Labs Lab 12/12/14 1621 12/13/14 0555 12/14/14 0340 12/16/14 0403  WBC 3.2* 10.7* 12.4* 8.5  NEUTROABS 2.4  --   --   --   HGB 13.2 12.8* 11.6* 11.0*  HCT 37.6* 38.5* 33.9* 32.6*  MCV 87.4 90.4 89.0 89.3  PLT 117* 90* 77* 128*   Cardiac Enzymes: No results for input(s): CKTOTAL, CKMB, CKMBINDEX, TROPONINI in the last 168 hours. BNP: Invalid input(s): POCBNP CBG: No results for input(s): GLUCAP in the last 168 hours.  Radiological Exams on Admission: Dg Abd 1 View  12/16/2014   CLINICAL DATA:  NG tube placement  EXAM: ABDOMEN - 1 VIEW  COMPARISON:  12/15/2014  FINDINGS: Enteric tube tip is in the left upper quadrant consistent with location in the upper stomach.  Probable small left pleural effusion. Atelectasis in the lung bases.  IMPRESSION: Enteric tube tip in the left upper quadrant consistent with location in the upper stomach.   Electronically Signed   By: Burman Nieves M.D.   On: 12/16/2014 06:25   Dg Abd Acute W/chest  12/15/2014   CLINICAL DATA:  Assess nasogastric tube positioning. The patient experienced coughing duringNG tube irrigation.  EXAM: DG ABDOMEN ACUTE W/ 1V CHEST  COMPARISON:  Chest x-ray of December 12, 2014  FINDINGS: The in nasogastric tube tip projects in the region of the proximal gastric fundus. The proximal port lies just below the expected location of the GE junction.  There is bibasilar atelectasis or infiltrate. There is a small left pleural effusion and trace right pleural effusion. The heart and pulmonary vascularity are normal.  IMPRESSION: 1. The nasogastric tube positioning is reasonable though advancement by an additional 10 cm would assure that the proximal port remains below the GE junction. 2. There is bibasilar atelectasis and small bilateral pleural effusions greater on the left than on the right.   Electronically Signed   By: David  [place] M.D.   On: 12/15/2014 09:06   Dg Kayleen Memos W/water Sol Cm  12/16/2014   CLINICAL DATA:  Patient status post surgical repair of perforated duodenum ulcer.  EXAM: WATER SOLUBLE UPPER GI SERIES  TECHNIQUE: Single-column upper GI series was performed using water soluble contrast.  CONTRAST:  200 mL Omnipaque  COMPARISON:  CT 12/12/2014  FLUOROSCOPY TIME:  Radiation Exposure Index (as provided by the fluoroscopic device):  If the device does not provide the exposure index:  Fluoroscopy Time (in minutes and seconds):  2 minutes 35 seconds  Number of Acquired Images:  16  FINDINGS: Oral contrast readily flows through the esophagus into the stomach. Contrast readily flowed out to the duodenum bulb and C-loop of the duodenum and ultimately the proximal small bowel.  Along the proximal portion of the second  portion duodenum just distal to the duodenum bulb there is a thin stream of contrast which connects to a approximately 2 cm x 1 cm collection of contrast. This small pocket of contrast has a stalk and cap appearance. No contrast extends beyond this small extraluminal pouch adjacent to the first portion the duodenum.  IMPRESSION: 1. Small pocket of extraluminal contrast connected to the first  portion duodenum via a thin channel presumably represents a small contained leak from the omental patch. 2. No evidence of obstruction or leak otherwise. Findings conveyed toDr. Leretha Dykes 12/16/2014  at11:26.   Electronically Signed   By: Genevive Bi M.D.   On: 12/16/2014 11:27    EKG: Independently reviewed. NSR with mildly prolonged QTc.    Time spent: 75 min  Renae Fickle [facility] Pager [phone]  If 7PM-7AM, please contact night-coverage [email or web address] Password [email or web address] 12/16/2014, 2:02 PM

## 2014-12-17 LAB — IRON AND TIBC
Iron: 44 ug/dL — ABNORMAL LOW (ref 45–182)
SATURATION RATIOS: 16 % — AB (ref 17.9–39.5)
TIBC: 277 ug/dL (ref 250–450)
UIBC: 233 ug/dL

## 2014-12-17 LAB — CBC
HCT: 32 % — ABNORMAL LOW (ref 39.0–52.0)
Hemoglobin: 10.9 g/dL — ABNORMAL LOW (ref 13.0–17.0)
MCH: 30.4 pg (ref 26.0–34.0)
MCHC: 34.1 g/dL (ref 30.0–36.0)
MCV: 89.4 fL (ref 78.0–100.0)
Platelets: 150 10*3/uL (ref 150–400)
RBC: 3.58 MIL/uL — ABNORMAL LOW (ref 4.22–5.81)
RDW: 15.8 % — AB (ref 11.5–15.5)
WBC: 6.4 10*3/uL (ref 4.0–10.5)

## 2014-12-17 LAB — COMPREHENSIVE METABOLIC PANEL
ALT: 92 U/L — AB (ref 17–63)
AST: 105 U/L — AB (ref 15–41)
Albumin: 2.5 g/dL — ABNORMAL LOW (ref 3.5–5.0)
Alkaline Phosphatase: 64 U/L (ref 38–126)
Anion gap: 5 (ref 5–15)
BUN: 15 mg/dL (ref 6–20)
CALCIUM: 8.2 mg/dL — AB (ref 8.9–10.3)
CO2: 27 mmol/L (ref 22–32)
Chloride: 106 mmol/L (ref 101–111)
Creatinine, Ser: 0.8 mg/dL (ref 0.61–1.24)
GFR calc Af Amer: >60 mL/min (ref >60)
GFR calc non Af Amer: >60 mL/min (ref >60)
GLUCOSE: 98 mg/dL (ref 65–99)
POTASSIUM: 3.7 mmol/L (ref 3.5–5.1)
Sodium: 138 mmol/L (ref 135–145)
Total Bilirubin: 2.5 mg/dL — ABNORMAL HIGH (ref 0.3–1.2)
Total Protein: 5.9 g/dL — ABNORMAL LOW (ref 6.5–8.1)

## 2014-12-17 LAB — PHOSPHORUS: Phosphorus: 3 mg/dL (ref 2.5–4.6)

## 2014-12-17 LAB — HEPATITIS A ANTIBODY, IGM: HEP A IGM: NEGATIVE

## 2014-12-17 LAB — VITAMIN B12: VITAMIN B 12: 1086 pg/mL — AB (ref 180–914)

## 2014-12-17 LAB — GLUCOSE, CAPILLARY
Glucose-Capillary: 116 mg/dL — ABNORMAL HIGH (ref 65–99)
Glucose-Capillary: 121 mg/dL — ABNORMAL HIGH (ref 65–99)
Glucose-Capillary: 94 mg/dL (ref 65–99)

## 2014-12-17 LAB — AMMONIA: AMMONIA: 25 umol/L (ref 9–35)

## 2014-12-17 LAB — HEPATITIS B SURFACE ANTIGEN: Hepatitis B Surface Ag: NEGATIVE

## 2014-12-17 LAB — FOLATE: Folate: 7.1 ng/mL (ref >5.9)

## 2014-12-17 LAB — MAGNESIUM: Magnesium: 2 mg/dL (ref 1.7–2.4)

## 2014-12-17 LAB — HEPATITIS C ANTIBODY

## 2014-12-17 LAB — PREALBUMIN: PREALBUMIN: 4.4 mg/dL — AB (ref 18–38)

## 2014-12-17 LAB — FERRITIN: FERRITIN: 273 ng/mL (ref 24–336)

## 2014-12-17 LAB — HEPATITIS B SURFACE ANTIBODY,QUALITATIVE: Hep B S Ab: NONREACTIVE

## 2014-12-17 LAB — TSH: TSH: 2.192 u[IU]/mL (ref 0.350–4.500)

## 2014-12-17 MED ORDER — SODIUM CHLORIDE 0.9 % IJ SOLN
10.0000 mL | INTRAMUSCULAR | Status: DC | PRN
  Administered 2014-12-19 – 2014-12-26 (×6): 10 mL
  Filled 2014-12-17 (×6): qty 40

## 2014-12-17 MED ORDER — SODIUM CHLORIDE 0.9 % IJ SOLN
10.0000 mL | Freq: Two times a day (BID) | INTRAMUSCULAR | Status: DC
  Administered 2014-12-17 – 2014-12-25 (×9): 10 mL

## 2014-12-17 MED ORDER — FAT EMULSION 20 % IV EMUL
240.0000 mL | INTRAVENOUS | Status: AC
  Administered 2014-12-17: 240 mL via INTRAVENOUS
  Filled 2014-12-17: qty 250

## 2014-12-17 MED ORDER — HEPATITIS B VAC RECOMBINANT 10 MCG/ML IJ SUSP
1.0000 mL | Freq: Once | INTRAMUSCULAR | Status: AC
Start: 2014-12-17 — End: 2014-12-17
  Administered 2014-12-17: 10 ug via INTRAMUSCULAR
  Filled 2014-12-17: qty 1

## 2014-12-17 MED ORDER — TRACE MINERALS CR-CU-MN-SE-ZN 10-1000-500-60 MCG/ML IV SOLN
INTRAVENOUS | Status: AC
  Administered 2014-12-17: 17:00:00 via INTRAVENOUS
  Filled 2014-12-17: qty 840

## 2014-12-17 MED ORDER — INSULIN ASPART 100 UNIT/ML ~~LOC~~ SOLN
0.0000 [IU] | Freq: Four times a day (QID) | SUBCUTANEOUS | Status: DC
  Administered 2014-12-17 – 2014-12-22 (×2): 1 [IU] via SUBCUTANEOUS

## 2014-12-17 MED ORDER — KCL-LACTATED RINGERS-D5W 20 MEQ/L IV SOLN
INTRAVENOUS | Status: AC
  Administered 2014-12-17 – 2014-12-18 (×2): via INTRAVENOUS
  Filled 2014-12-17 (×3): qty 1000

## 2014-12-17 MED ORDER — HEPATITIS A VACCINE 1440 EL U/ML IM SUSP
1.0000 mL | Freq: Once | INTRAMUSCULAR | Status: AC
Start: 2014-12-17 — End: 2014-12-17
  Administered 2014-12-17: 1440 [IU] via INTRAMUSCULAR
  Filled 2014-12-17: qty 1

## 2014-12-17 NOTE — Progress Notes (Signed)
Nutrition Follow-up  DOCUMENTATION CODES:   Non-severe (moderate) malnutrition in context of acute illness/injury  INTERVENTION:   TPN per Pharmacy Monitor magnesium, potassium, and phosphorus daily for at least 3 days, MD to replete as needed, as pt is at risk for refeeding syndrome given malnutrition status and poor PO intake PTA.  Diet advancement per MD RD to continue to monitor  NUTRITION DIAGNOSIS:   Inadequate oral intake related to inability to eat as evidenced by NPO status.  Ongoing.  GOAL:   Patient will meet greater than or equal to 90% of their needs  Unmet.  MONITOR:   Diet advancement, Labs, Weight trends, I & O's, Other (Comment) (TPN)  REASON FOR ASSESSMENT:   Consult New TPN/TNA  ASSESSMENT:   64 year old male with worsening 2 day history of upper abdominal pain. Decreased appetite. Nauseated but not during up. Drinks a fifth of liquor and at least a sixpack of beer a day. Smokes about a pack of cigarettes a day. Has been admitted in the past for alcohol toxicity and delirium tremens. Upper abdominal pain concerning. Ultrasound showed some mild gallbladder wall thickening. No evidence of cholecystitis. Liver function tests all elevated. Concern for cholecystitis. Surgical consultation requested.  7/14: TPN to be initiated today. Estimated nutritional needs below. Pt NPO x 5 days.  Plan per Pharmacy: At 1800 today:  Once PICC placed and placement confirmed (or at 18:00), Start Clinimix E5/15 at 6735ml/hr (~50% goal)  20% fat emulsion at 7510ml/hr.  Monitor closely for re-feeding d/t chronic malnutrition  Plan to advance as tolerated to the goal rate. Goal: Clinimix 5/15 at a goal rate of 3970ml/hr + 20% fat emulsion at 1210ml/hr to provide: 84g/day protein, 1672Kcal/day.  7/11: -Pt NPO and reports abdominal pain with deep inhalations and feels if he were to consume something he would vomit.  -Pt POD #1 ex lap with laparoscopic omental patch  of perforated ulcer and core liver biopsies.  -He indicates that PTA he had a poor appetite x1 week and the last thing he ate was 1/5 of a Whopper and he felt very nauseous after consumption. -Weight hx review indicates pt has lost 14 lbs (8% body weight) in unknown time frame from UBW.   Labs reviewed.  Diet Order:  Diet - low sodium heart healthy Diet NPO time specified Except for: Ice Chips TPN (CLINIMIX-E) Adult  Skin:  Wound (see comment) (abdominal incision)  Last BM:  7/14  Height:   Ht Readings from Last 1 Encounters:  12/13/14 6\' 2"  (1.88 m)    Weight:   Wt Readings from Last 1 Encounters:  12/13/14 151 lb 7.3 oz (68.7 kg)    Ideal Body Weight:  86.4 kg (kg)  Wt Readings from Last 10 Encounters:  12/13/14 151 lb 7.3 oz (68.7 kg)  09/07/13 148 lb (67.132 kg)    BMI:  Body mass index is 19.44 kg/(m^2).  Estimated Nutritional Needs:   Kcal:  [id number]  Protein:  75-85 grams  Fluid:  2.2 L/day  EDUCATION NEEDS:   No education needs identified at this time  Tilda FrancoLindsey Meagen Limones, MS, RD, LDN Pager: [phone] After Hours Pager: [phone]

## 2014-12-17 NOTE — Progress Notes (Signed)
Patient ID: Alfred [name], male   DOB: [birth year], 64 y.o.   MRN: [id number]     [facility]      [address] [place], [place] [address]    Phone: [phone] FAX: [phone]     Subjective: Having diarrhea.  c diff is negative.  tx due to confusion.  Alert, pleasant but confused.  UGI noted a leak.   Objective:  Vital signs:  Filed Vitals:   12/16/14 2355 12/17/14 0000 12/17/14 0400 12/17/14 0606  BP:  154/103 141/103 125/84  Pulse:  88 90   Temp:  98.6 F (37 C) 97.7 F (36.5 C)   TempSrc:  Oral Oral   Resp:  26 24   Height:      Weight:      SpO2: 99% 93% 100%     Last BM Date: 12/16/14 (according to patient that is a/ox2)  Intake/Output   Yesterday:  07/13 0701 - 07/14 0700 In: 1843.8 [I.V.:1593.8; IV Piggyback:250] Out: 355 [Urine:353; Stool:2] This shift:    7:48 AM  Physical Exam: General: Pt awake/alert/oriented x1.  NAD. Chest: cta. No chest wall pain w good excursion CV:  Pulses intact.  Regular rhythm Abdomen: Soft.  Nondistended.   Mildly tender at incisions only.  Incisions are c/d/i. No evidence of peritonitis.  No incarcerated hernias. Ext:  SCDs BLE.  No mjr edema.  No cyanosis Skin: No petechiae / purpura   Problem List:   Principal Problem:   Perforated duodenal bulb ulcer s/p lap omental patch 12/13/2014 Active Problems:   Protein-calorie malnutrition, moderate   Alcohol abuse   Cirrhosis, alcoholic   Tobacco abuse   Colonoscopy refused   Inguinal hernia, right   Family history of colon cancer (father)    Results:   Labs: Results for orders placed or performed during the hospital encounter of 12/12/14 (from the past 48 hour(s))  CBC     Status: Abnormal   Collection Time: 12/16/14  4:03 AM  Result Value Ref Range   WBC 8.5 4.0 - 10.5 K/uL   RBC 3.65 (L) 4.22 - 5.81 MIL/uL   Hemoglobin 11.0 (L) 13.0 - 17.0 g/dL   HCT 32.6 (L) 39.0 - 52.0 %   MCV 89.3 78.0 - 100.0 fL   MCH  30.1 26.0 - 34.0 pg   MCHC 33.7 30.0 - 36.0 g/dL   RDW 15.6 (H) 11.5 - 15.5 %   Platelets 128 (L) 150 - 400 K/uL  Basic metabolic panel     Status: Abnormal   Collection Time: 12/16/14  4:03 AM  Result Value Ref Range   Sodium 136 135 - 145 mmol/L   Potassium 3.3 (L) 3.5 - 5.1 mmol/L   Chloride 102 101 - 111 mmol/L   CO2 23 22 - 32 mmol/L   Glucose, Bld 69 65 - 99 mg/dL   BUN 17 6 - 20 mg/dL   Creatinine, Ser 0.77 0.61 - 1.24 mg/dL   Calcium 8.1 (L) 8.9 - 10.3 mg/dL   GFR calc non Af Amer >60 >60 mL/min   GFR calc Af Amer >60 >60 mL/min    Comment: (NOTE) The eGFR has been calculated using the CKD EPI equation. This calculation has not been validated in all clinical situations. eGFR's persistently <60 mL/min signify possible Chronic Kidney Disease.    Anion gap 11 5 - 15  Hepatic function panel     Status: Abnormal   Collection Time: 12/16/14  2:35 PM  Result Value Ref Range   Total Protein 6.4 (L) 6.5 - 8.1 g/dL   Albumin 2.8 (L) 3.5 - 5.0 g/dL   AST 120 (H) 15 - 41 U/L   ALT 106 (H) 17 - 63 U/L   Alkaline Phosphatase 69 38 - 126 U/L   Total Bilirubin 3.7 (H) 0.3 - 1.2 mg/dL   Bilirubin, Direct 1.7 (H) 0.1 - 0.5 mg/dL   Indirect Bilirubin 2.0 (H) 0.3 - 0.9 mg/dL  Hepatitis A antibody, IgM     Status: None   Collection Time: 12/16/14  2:35 PM  Result Value Ref Range   Hep A IgM Negative Negative    Comment: (NOTE) Performed At: [facility] [place], [place] [id number] Lindon Romp MD [id number]   Hepatitis B surface antibody     Status: None   Collection Time: 12/16/14  2:35 PM  Result Value Ref Range   Hep B S Ab Non Reactive     Comment: (NOTE)              Non Reactive: Inconsistent with immunity,                            less than 10 mIU/mL              Reactive:     Consistent with immunity,                            greater than 9.9 mIU/mL Performed At: [facility] [place], [place] [id number] Lindon Romp MD [id number]   Hepatitis B surface antigen     Status: None   Collection Time: 12/16/14  2:35 PM  Result Value Ref Range   Hepatitis B Surface Ag Negative Negative    Comment: (NOTE) Performed At: [facility] [place], [place] [id number] Lindon Romp MD SJ:[id number]   Hepatitis C antibody     Status: Abnormal   Collection Time: 12/16/14  2:35 PM  Result Value Ref Range   HCV Ab >11.0 (H) 0.0 - 0.9 s/co ratio    Comment: (NOTE)                                  Negative:     < 0.8                             Indeterminate: 0.8 - 0.9                                  Positive:     > 0.9 The CDC recommends that a positive HCV antibody result be followed up with a HCV Nucleic Acid Amplification test ([id number]). Performed At: [facility] [place], [place] [id number] Lindon Romp MD XN:[id number]   Glucose, capillary     Status: Abnormal   Collection Time: 12/16/14  4:31 PM  Result Value Ref Range   Glucose-Capillary 59 (L) 65 - 99 mg/dL  Glucose, capillary     Status: None   Collection Time: 12/16/14  4:55 PM  Result Value Ref Range   Glucose-Capillary 93 65 - 99 mg/dL  Clostridium Difficile by PCR (not at  [place])     Status: None   Collection Time: 12/16/14  5:38 PM  Result Value Ref Range   C difficile by pcr NEGATIVE NEGATIVE  Comprehensive metabolic panel     Status: Abnormal   Collection Time: 12/17/14  3:25 AM  Result Value Ref Range   Sodium 138 135 - 145 mmol/L   Potassium 3.7 3.5 - 5.1 mmol/L   Chloride 106 101 - 111 mmol/L   CO2 27 22 - 32 mmol/L   Glucose, Bld 98 65 - 99 mg/dL   BUN 15 6 - 20 mg/dL   Creatinine, Ser 0.80 0.61 - 1.24 mg/dL   Calcium 8.2 (L) 8.9 - 10.3 mg/dL   Total Protein 5.9 (L) 6.5 - 8.1 g/dL   Albumin 2.5 (L) 3.5 - 5.0 g/dL   AST 105 (H) 15 - 41 U/L   ALT 92 (H) 17 - 63 U/L   Alkaline Phosphatase 64 38 - 126 U/L   Total Bilirubin 2.5 (H) 0.3 - 1.2 mg/dL   GFR calc non Af  Amer >60 >60 mL/min   GFR calc Af Amer >60 >60 mL/min    Comment: (NOTE) The eGFR has been calculated using the CKD EPI equation. This calculation has not been validated in all clinical situations. eGFR's persistently <60 mL/min signify possible Chronic Kidney Disease.    Anion gap 5 5 - 15  CBC     Status: Abnormal   Collection Time: 12/17/14  3:25 AM  Result Value Ref Range   WBC 6.4 4.0 - 10.5 K/uL   RBC 3.58 (L) 4.22 - 5.81 MIL/uL   Hemoglobin 10.9 (L) 13.0 - 17.0 g/dL   HCT 32.0 (L) 39.0 - 52.0 %   MCV 89.4 78.0 - 100.0 fL   MCH 30.4 26.0 - 34.0 pg   MCHC 34.1 30.0 - 36.0 g/dL   RDW 15.8 (H) 11.5 - 15.5 %   Platelets 150 150 - 400 K/uL  Magnesium     Status: None   Collection Time: 12/17/14  3:25 AM  Result Value Ref Range   Magnesium 2.0 1.7 - 2.4 mg/dL  TSH     Status: None   Collection Time: 12/17/14  3:25 AM  Result Value Ref Range   TSH 2.192 0.350 - 4.500 uIU/mL    Imaging / Studies: Dg Abd 1 View  12/16/2014   CLINICAL DATA:  NG tube placement  EXAM: ABDOMEN - 1 VIEW  COMPARISON:  12/15/2014  FINDINGS: Enteric tube tip is in the left upper quadrant consistent with location in the upper stomach. Probable small left pleural effusion. Atelectasis in the lung bases.  IMPRESSION: Enteric tube tip in the left upper quadrant consistent with location in the upper stomach.   Electronically Signed   By: Lucienne Capers M.D.   On: 12/16/2014 06:25   Dg Abd Acute W/chest  12/15/2014   CLINICAL DATA:  Assess nasogastric tube positioning. The patient experienced coughing duringNG tube irrigation.  EXAM: DG ABDOMEN ACUTE W/ 1V CHEST  COMPARISON:  Chest x-ray of December 12, 2014  FINDINGS: The in nasogastric tube tip projects in the region of the proximal gastric fundus. The proximal port lies just below the expected location of the GE junction.  There is bibasilar atelectasis or infiltrate. There is a small left pleural effusion and trace right pleural effusion. The heart and pulmonary  vascularity are normal.  IMPRESSION: 1. The nasogastric tube positioning is reasonable though advancement by an additional 10 cm would assure that the  proximal port remains below the GE junction. 2. There is bibasilar atelectasis and small bilateral pleural effusions greater on the left than on the right.   Electronically Signed   By: David  Martinique M.D.   On: 12/15/2014 09:06   Dg Duanne Limerick W/water Sol Cm  12/16/2014   CLINICAL DATA:  Patient status post surgical repair of perforated duodenum ulcer.  EXAM: WATER SOLUBLE UPPER GI SERIES  TECHNIQUE: Single-column upper GI series was performed using water soluble contrast.  CONTRAST:  200 mL Omnipaque  COMPARISON:  CT 12/12/2014  FLUOROSCOPY TIME:  Radiation Exposure Index (as provided by the fluoroscopic device):  If the device does not provide the exposure index:  Fluoroscopy Time (in minutes and seconds):  2 minutes 35 seconds  Number of Acquired Images:  16  FINDINGS: Oral contrast readily flows through the esophagus into the stomach. Contrast readily flowed out to the duodenum bulb and C-loop of the duodenum and ultimately the proximal small bowel.  Along the proximal portion of the second portion duodenum just distal to the duodenum bulb there is a thin stream of contrast which connects to a approximately 2 cm x 1 cm collection of contrast. This small pocket of contrast has a stalk and cap appearance. No contrast extends beyond this small extraluminal pouch adjacent to the first portion the duodenum.  IMPRESSION: 1. Small pocket of extraluminal contrast connected to the first portion duodenum via a thin channel presumably represents a small contained leak from the omental patch. 2. No evidence of obstruction or leak otherwise. Findings conveyed toDr. Alta Corning 12/16/2014  at11:26.   Electronically Signed   By: Suzy Bouchard M.D.   On: 12/16/2014 11:27    Medications / Allergies:  Scheduled Meds: . antiseptic oral rinse  7 mL Mouth Rinse BID  . heparin  5,000  Units Subcutaneous 3 times per day  . lip balm  1 application Topical BID  . lip balm      . LORazepam  0-4 mg Intravenous Q2H  . pantoprazole (PROTONIX) IV  80 mg Intravenous Q12H  . piperacillin-tazobactam (ZOSYN)  IV  3.375 g Intravenous Q8H  . thiamine IV  100 mg Intravenous Daily  . thiamine  100 mg Oral Daily   Continuous Infusions: . dextrose 5% lactated ringers with KCl 20 mEq/L 125 mL/hr at 12/17/14 0223   PRN Meds:.acetaminophen, alum & mag hydroxide-simeth, bisacodyl, diphenhydrAMINE, HYDROmorphone (DILAUDID) injection, magic mouthwash, menthol-cetylpyridinium, methocarbamol (ROBAXIN)  IV, metoprolol, phenol, promethazine  Antibiotics: Anti-infectives    Start     Dose/Rate Route Frequency Ordered Stop   12/13/14 1000  piperacillin-tazobactam (ZOSYN) IVPB 3.375 g     3.375 g 12.5 mL/hr over 240 Minutes Intravenous Every 8 hours 12/13/14 0940 12/20/14 0959   12/12/14 2245  clindamycin (CLEOCIN) 900 mg, gentamicin (GARAMYCIN) 240 mg in sodium chloride 0.9 % 1,000 mL for intraperitoneal lavage  Status:  Discontinued    Comments:  Pharmacy may adjust dosing strength, schedule, rate of infusion, etc as needed to optimize therapy    Intraperitoneal To Surgery 12/12/14 2238 12/13/14 0205   12/12/14 2030  piperacillin-tazobactam (ZOSYN) IVPB 3.375 g     3.375 g 100 mL/hr over 30 Minutes Intravenous  Once 12/12/14 2020 12/12/14 2140        Assessment/Plan POD#5 laparoscopic omental patch of perforated duodenal bulb ulcer and core liver biopsy---Dr. Johney Maine  -UGI showed small contained leak.  Keep NPO and start TPN. -Protonix 20m BID, then 482mBID when tolerating PO x8 weeks -will need  EGD in 6-8 weeks to ensure ulcer has healed v. Biopsy. Also needs a screening colonoscopy at this time. -pain control, mobilize, pulmonary toilet -PPI, ?should we reduce dose from 32m BID ID-zosyn D#5/7.H pylori negative.  Cirrhosis, Alcohol and HCV-biopsy shows steatohepatitis and early  cirrhosis on biopsy.  HCV RNA pending.  Appreciate medicine management.  Delirium-check ammonia, continue CIWA protocol.  VTE prophylaxis-SCD/heparin FEN-no NGT at this time.  NPO for leak. PCM-check prealbumin. Start TPN Dispo-ICU.  Condition guarded.    I tried to call every number listed in contact info but either not working, no private voice mail or no voicemail set up.   EErby Pian ASaint [facility] CenterSurgery Pager [phone])   12/17/2014 7:46 AM

## 2014-12-17 NOTE — Progress Notes (Signed)
PARENTERAL NUTRITION CONSULT NOTE - INITIAL  Pharmacy Consult for TPN Indication: perforated duodenal ulcer  Allergies  Allergen Reactions  . Nsaids Other (See Comments)    Perforated ulcer    Patient Measurements: Height: 6\' 2"  (188 cm) Weight: 151 lb 7.3 oz (68.7 kg) IBW/kg (Calculated) : 82.2 Adjusted Body Weight:  Usual Weight:   Vital Signs: Temp: 97.7 F (36.5 C) (07/14 0400) Temp Source: Oral (07/14 0400) BP: 125/84 mmHg (07/14 0606) Pulse Rate: 90 (07/14 0400) Intake/Output from previous day: 07/13 0701 - 07/14 0700 In: 1843.8 [I.V.:1593.8; IV Piggyback:250] Out: 355 [Urine:353; Stool:2] Intake/Output from this shift:    Labs:  Recent Labs  12/16/14 0403 12/17/14 0325  WBC 8.5 6.4  HGB 11.0* 10.9*  HCT 32.6* 32.0*  PLT 128* 150     Recent Labs  12/16/14 0403 12/16/14 1435 12/17/14 0325  NA 136  --  138  K 3.3*  --  3.7  CL 102  --  106  CO2 23  --  27  GLUCOSE 69  --  98  BUN 17  --  15  CREATININE 0.77  --  0.80  CALCIUM 8.1*  --  8.2*  MG  --   --  2.0  PROT  --  6.4* 5.9*  ALBUMIN  --  2.8* 2.5*  AST  --  120* 105*  ALT  --  106* 92*  ALKPHOS  --  69 64  BILITOT  --  3.7* 2.5*  BILIDIR  --  1.7*  --   IBILI  --  2.0*  --    Estimated Creatinine Clearance: 93 mL/min (by C-G formula based on Cr of 0.8).    Recent Labs  12/16/14 1631 12/16/14 1655  GLUCAP 59* 93    Medical History: Past Medical History  Diagnosis Date  . Delirium tremens 09/10/2013  . Dental abscess 09/10/2013  . Knee effusion, left 09/10/2013  . Mental status change 09/10/2013    Insulin Requirements: No insulin orders  Current Nutrition: NPO  IVF: D5 LR + 20meq KCl at 15925ml/min  Central access: PICC to be placed 7/14 TPN start date: 7/14  ASSESSMENT                                                                                                          HPI: [id number] YOM presented 7/9 with abdominal pain, underwent lap exploration 7/10 for free air and found  to have perforated doudenal bulb ulcer s/p patch repair.  He has alcohol cirrhosis with elevated LFTs.  Baseline malnutrition noted.  Orders to start TPN as patient has been NPO since surgery.   Significant events:  7/10 Surgical repair of perforated DU 7/14 start TPN  Today:    Glucose - having episodes of hypoglycemia  Electrolytes - Corr Ca = 9.4, other lytes WNL  Renal - SCr WNL  LFTs - Elevated LFTs (improving from admission)  TGs - for 7/15am  Prealbumin - ordered for 7/14a  NUTRITIONAL GOALS  RD recs: 0981-1914 Kcals, 75-85 gr protein Clinimix 5/15 at a goal rate of 45ml/hr + 20% fat emulsion at 74ml/hr to provide: 84g/day protein, 1672Kcal/day.  PLAN                                                                                                                         At 1800 today:  Once PICC placed and placement confirmed (or at 18:00), Start Clinimix E5/15 at 54ml/hr (~50% goal)  20% fat emulsion at 44ml/hr.  Monitor closely for re-feeding d/t chronic malnutrition  Plan to advance as tolerated to the goal rate.  TPN to contain standard multivitamins and trace elements.  Tbili elevated but no need to reduce or eliminate trace elements at this point (possibly slight jaundice of sclera - monitor for worsening)   Reduce IVF to 79ml/hr at 1800.  Due to hypoglycemia will not remove D5W from MIVF with start of TPN. Likely will be able to removed once TPN near or at goal.    Add q6h senstive SSI and CBGs .   TPN lab panels on Mondays & Thursdays.  Note patient currently ordered daily CMP, Mg, and CBC so will need to enter TPN labs if these orders changed  F/u daily.  Juliette Alcide, PharmD, BCPS.   Pager: [phone]  12/17/2014,7:58 AM

## 2014-12-17 NOTE — Progress Notes (Signed)
Peripherally Inserted Central Catheter/Midline Placement  The IV Nurse has discussed with the patient and/or persons authorized to consent for the patient, the purpose of this procedure and the potential benefits and risks involved with this procedure.  The benefits include less needle sticks, lab draws from the catheter and patient may be discharged home with the catheter.  Risks include, but not limited to, infection, bleeding, blood clot (thrombus formation), and puncture of an artery; nerve damage and irregular heat beat.  Alternatives to this procedure were also discussed.  PICC/Midline Placement Documentation    Information given to sister Lynnae Januaryarlene Thaxton    Reginia Battie Renee 12/17/2014, 12:55 PM

## 2014-12-17 NOTE — Progress Notes (Signed)
[facility] PROGRESS NOTE  Alfred [name] ZOX:[id number] DOB: [birth year] DOA: 12/12/2014 PCP: No PCP Per Patient  Brief Summary  The patient is a 64 year old male with history of alcohol and tobacco abuse who presented with a 2 day history of upper abdominal pain, nausea, anorexia. He had severe aching right upper quadrant pain that became progressively worse and therefore he came to the hospital. CT scan demonstrated moderate to large amount of free air and free fluid within the abdomen and pelvis tracking along the paracolic gutters consistent with bowel perforation. He also had some soft tissue inflammation around the gallbladder and his liver function tests were elevated concerning for possible cholecystitis. He was found to have a moderate to large right inguinal hernia and also some focal wall thickening of the antrum of the stomach which appeared stable from 2015. He was admitted by general surgery and underwent exploratory laparoscopy. He was found to have a perforated duodenal ulcer and had laparoscopic omental patch of his perforated ulcer as well as core liver biopsies. His postoperative course has been complicated by DTs. His liver biopsy demonstrated early cirrhosis and hepatic steatosis consistent with alcohol abuse. Internal medicine has been consulted secondary to the finding of cirrhosis and the need for follow-up.  Assessment/Plan  Perforated duodenal ulcer status post laparoscopic omental patch on 12/13/2014 - H. Pylori negative  - Counseled EtOH and NSAID avoidance - Continue BID PPI at discharge - F/u with GI scheduled by myself today with Dr. Randa EvensEdwards - General surgery managing this primarily  Alcoholic cirrhosis with liver biopsy performed on 12/13/2014. Hyperbilirubinemia and transaminitis resolving. Does have mild thrombocytopenia but this was more likely related to acute illness since it is quickly resolving. coags are normal.  - Avoid tylenol and tylenol  containing medications - Hep A and B neg >> start vaccination series -  HCV Ab + >> check RNA quant and genotype - LFTs trending down - Counseled EtOH cessation and added cirrhosis education to discharge paperwork  Alcohol withdrawal with elevated CIWA scores up to 11 - Changed to every 4 hour CIWA -  Received 10 mg of Ativan in the last 24 hours - Continue thiamine, folate -  Transfer to telemetry - SW consult placed > please provide information about community resources once patient more oriented  Delirium, clearly confused at the time of my exam - Check CBG - Replete potassium - Monitor for underlying infection (afebrile and no leukocytosis) - Continue treatment for alcohol withdrawal  Elevated BP - Continue prn metoprolol for high blood pressure  Hypokalemia, resolved with IV supplementation. Magnesium within normal limits  Hypoglycemia this AM likely because NPO - Check CBG - Hypoglycemia protocol - Continue dextrose to IVF  Normocytic anemia, hgb stable - Iron studies c/w possible iron deficiency although ferritin somewhat elevated (chronic disease), B12 1086, folate 7.1 - TSH 2.192 -  Start iron once tolerating diet  Mild thrombocytopenia, acute phase reactant and resolving - Repeat CBC in AM  Mildly prolonged QTc - D/c zofran and use phenergan instead - magnesium wnl  Moderate protein calorie malnutrition - Resume diet ASAP with supplements - starting TPN this evening  I have requested the patient be scheduled for community health and wellness clinic by the case manager. I will followup again tomorrow. Please contact me if I can be of assistance in the meanwhile. Thank you for this consultation.   HPI/Subjective:  Confused, agitated, not able to give history  Objective: Filed Vitals:   12/17/14 0000 12/17/14 0400  12/17/14 0606 12/17/14 0800  BP: 154/103 141/103 125/84 144/85  Pulse: 88 90  83  Temp: 98.6 F (37 C) 97.7 F (36.5  C)  98 F (36.7 C)  TempSrc: Oral Oral  Oral  Resp: Height:      Weight:      SpO2: 93% 100%  100%    Intake/Output Summary (Last 24 hours) at 12/17/14 1200 Last data filed at 12/17/14 1115  Gross per 24 hour  Intake 2443.75 ml  Output    354 ml  Net 2089.75 ml   Filed Weights   12/13/14 0215  Weight: 68.7 kg (151 lb 7.3 oz)   Body mass index is 19.44 kg/(m^2).  Exam:   General:  Adult male, No acute distress, talking nonsensically  HEENT:  NCAT, MMM  Cardiovascular:  RRR, nl S1, S2 no mrg, 2+ pulses, warm extremities  Respiratory:  CTAB, no increased WOB  Abdomen:   Hypoactive bowel sounds, soft, moderately distended, tender to palpation diffusely to deep palpation without rebound or guarding  MSK:   Normal tone and bulk, no LEE  Neuro:  Grossly moves all extremities  Data Reviewed: Basic Metabolic Panel:  Recent Labs Lab 12/12/14 1621 12/13/14 0555 12/14/14 0340 12/16/14 0403 12/17/14 0325  NA 131* 135 132* 136 138  K 3.6 4.9 4.1 3.3* 3.7  CL 101 108 102 102 106  CO2 19* 20* GLUCOSE 87 105* 82 69 98  BUN CREATININE 0.64 0.95 0.78 0.77 0.80  CALCIUM 8.2* 7.2* 7.5* 8.1* 8.2*  MG  --   --   --   --  2.0  PHOS  --   --   --   --  3.0   Liver Function Tests:  Recent Labs Lab 12/12/14 1621 12/13/14 0555 12/14/14 0340 12/16/14 1435 12/17/14 0325  AST 396* 331* 191* 120* 105*  ALT 241* 208* 152* 106* 92*  ALKPHOS 132* 93 78 69 64  BILITOT 3.2* 4.4* 3.9* 3.7* 2.5*  PROT 6.4* 6.0* 5.7* 6.4* 5.9*  ALBUMIN 3.0* 2.7* 2.6* 2.8* 2.5*    Recent Labs Lab 12/12/14 1621 12/13/14 0555  LIPASE 36 50    Recent Labs Lab 12/12/14 1931 12/17/14 0804  AMMONIA 31 25   CBC:  Recent Labs Lab 12/12/14 1621 12/13/14 0555 12/14/14 0340 12/16/14 0403 12/17/14 0325  WBC 3.2* 10.7* 12.4* 8.5 6.4  NEUTROABS 2.4  --   --   --   --   HGB 13.2 12.8* 11.6* 11.0* 10.9*  HCT 37.6* 38.5* 33.9* 32.6* 32.0*  MCV 87.4  90.4 89.0 89.3 89.4  PLT 117* 90* 77* 128* 150    Recent Results (from the past 240 hour(s))  MRSA PCR Screening     Status: None   Collection Time: 12/13/14  2:06 AM  Result Value Ref Range Status   MRSA by PCR NEGATIVE NEGATIVE Final    Comment:        The GeneXpert MRSA Assay (FDA approved for NASAL specimens only), is one component of a comprehensive MRSA colonization surveillance program. It is not intended to diagnose MRSA infection nor to guide or monitor treatment for MRSA infections.   Clostridium Difficile by PCR (not at [facility])     Status: None   Collection Time: 12/16/14  5:38 PM  Result Value Ref Range Status   C difficile by pcr NEGATIVE NEGATIVE Final     Studies: Dg Abd 1 View  12/16/2014   CLINICAL DATA:  NG tube placement  EXAM: ABDOMEN - 1 VIEW  COMPARISON:  12/15/2014  FINDINGS: Enteric tube tip is in the left upper quadrant consistent with location in the upper stomach. Probable small left pleural effusion. Atelectasis in the lung bases.  IMPRESSION: Enteric tube tip in the left upper quadrant consistent with location in the upper stomach.   Electronically Signed   By: Burman Nieves M.D.   On: 12/16/2014 06:25   Dg Kayleen Memos W/water Sol Cm  12/16/2014   CLINICAL DATA:  Patient status post surgical repair of perforated duodenum ulcer.  EXAM: WATER SOLUBLE UPPER GI SERIES  TECHNIQUE: Single-column upper GI series was performed using water soluble contrast.  CONTRAST:  200 mL Omnipaque  COMPARISON:  CT 12/12/2014  FLUOROSCOPY TIME:  Radiation Exposure Index (as provided by the fluoroscopic device):  If the device does not provide the exposure index:  Fluoroscopy Time (in minutes and seconds):  2 minutes 35 seconds  Number of Acquired Images:  16  FINDINGS: Oral contrast readily flows through the esophagus into the stomach. Contrast readily flowed out to the duodenum bulb and C-loop of the duodenum and ultimately the proximal small bowel.  Along the proximal portion of the  second portion duodenum just distal to the duodenum bulb there is a thin stream of contrast which connects to a approximately 2 cm x 1 cm collection of contrast. This small pocket of contrast has a stalk and cap appearance. No contrast extends beyond this small extraluminal pouch adjacent to the first portion the duodenum.  IMPRESSION: 1. Small pocket of extraluminal contrast connected to the first portion duodenum via a thin channel presumably represents a small contained leak from the omental patch. 2. No evidence of obstruction or leak otherwise. Findings conveyed toDr. Leretha Dykes 12/16/2014  at11:26.   Electronically Signed   By: Genevive Bi M.D.   On: 12/16/2014 11:27    Scheduled Meds: . antiseptic oral rinse  7 mL Mouth Rinse BID  . heparin  5,000 Units Subcutaneous 3 times per day  . insulin aspart  0-9 Units Subcutaneous 4 times per day  . lip balm  1 application Topical BID  . LORazepam  0-4 mg Intravenous Q2H  . pantoprazole (PROTONIX) IV  80 mg Intravenous Q12H  . piperacillin-tazobactam (ZOSYN)  IV  3.375 g Intravenous Q8H  . thiamine IV  100 mg Intravenous Daily  . thiamine  100 mg Oral Daily   Continuous Infusions: . dextrose 5% lactated ringers with KCl 20 mEq/L 125 mL/hr at 12/17/14 1100  . dextrose 5% lactated ringers with KCl 20 mEq/L    . Marland KitchenTPN (CLINIMIX-E) Adult     And  . fat emulsion      Principal Problem:   Perforated duodenal bulb ulcer s/p lap omental patch 12/13/2014 Active Problems:   Protein-calorie malnutrition, moderate   Alcohol abuse   Cirrhosis, alcoholic   Tobacco abuse   Colonoscopy refused   Inguinal hernia, right   Family history of colon cancer (father)    Time spent: 30 min    Raidon Swanner, [facility]  [facility] Pager [phone]. If 7PM-7AM, please contact night-coverage at [email or web address], password [facility] 12/17/2014, 12:00 PM  LOS: 4 days

## 2014-12-18 LAB — DIFFERENTIAL
BASOS PCT: 0 % (ref 0–1)
Basophils Absolute: 0 10*3/uL (ref 0.0–0.1)
Eosinophils Absolute: 0.1 10*3/uL (ref 0.0–0.7)
Eosinophils Relative: 1 % (ref 0–5)
Lymphocytes Relative: 10 % — ABNORMAL LOW (ref 12–46)
Lymphs Abs: 0.7 10*3/uL (ref 0.7–4.0)
Monocytes Absolute: 1.4 10*3/uL — ABNORMAL HIGH (ref 0.1–1.0)
Monocytes Relative: 21 % — ABNORMAL HIGH (ref 3–12)
NEUTROS PCT: 68 % (ref 43–77)
Neutro Abs: 4.3 10*3/uL (ref 1.7–7.7)

## 2014-12-18 LAB — CBC
HEMATOCRIT: 29.3 % — AB (ref 39.0–52.0)
Hemoglobin: 10 g/dL — ABNORMAL LOW (ref 13.0–17.0)
MCH: 30.7 pg (ref 26.0–34.0)
MCHC: 34.1 g/dL (ref 30.0–36.0)
MCV: 89.9 fL (ref 78.0–100.0)
PLATELETS: 152 10*3/uL (ref 150–400)
RBC: 3.26 MIL/uL — AB (ref 4.22–5.81)
RDW: 15.9 % — ABNORMAL HIGH (ref 11.5–15.5)
WBC: 6.5 10*3/uL (ref 4.0–10.5)

## 2014-12-18 LAB — GLUCOSE, CAPILLARY
Glucose-Capillary: 114 mg/dL — ABNORMAL HIGH (ref 65–99)
Glucose-Capillary: 120 mg/dL — ABNORMAL HIGH (ref 65–99)
Glucose-Capillary: 114 mg/dL — ABNORMAL HIGH (ref 65–99)

## 2014-12-18 LAB — COMPREHENSIVE METABOLIC PANEL
ALT: 89 U/L — ABNORMAL HIGH (ref 17–63)
ANION GAP: 2 — AB (ref 5–15)
AST: 121 U/L — AB (ref 15–41)
Albumin: 2.1 g/dL — ABNORMAL LOW (ref 3.5–5.0)
Alkaline Phosphatase: 58 U/L (ref 38–126)
BILIRUBIN TOTAL: 1.7 mg/dL — AB (ref 0.3–1.2)
BUN: 8 mg/dL (ref 6–20)
CALCIUM: 7.7 mg/dL — AB (ref 8.9–10.3)
CO2: 28 mmol/L (ref 22–32)
Chloride: 107 mmol/L (ref 101–111)
Creatinine, Ser: 0.61 mg/dL (ref 0.61–1.24)
GFR calc non Af Amer: >60 mL/min (ref >60)
Glucose, Bld: 129 mg/dL — ABNORMAL HIGH (ref 65–99)
POTASSIUM: 3.5 mmol/L (ref 3.5–5.1)
Sodium: 137 mmol/L (ref 135–145)
Total Protein: 5.2 g/dL — ABNORMAL LOW (ref 6.5–8.1)

## 2014-12-18 LAB — TRIGLYCERIDES: Triglycerides: 132 mg/dL (ref <150)

## 2014-12-18 LAB — MAGNESIUM: Magnesium: 1.6 mg/dL — ABNORMAL LOW (ref 1.7–2.4)

## 2014-12-18 LAB — PHOSPHORUS: Phosphorus: 1.8 mg/dL — ABNORMAL LOW (ref 2.5–4.6)

## 2014-12-18 MED ORDER — KCL-LACTATED RINGERS-D5W 20 MEQ/L IV SOLN
INTRAVENOUS | Status: DC
  Filled 2014-12-18 (×3): qty 1000

## 2014-12-18 MED ORDER — POTASSIUM PHOSPHATES 15 MMOLE/5ML IV SOLN
20.0000 mmol | Freq: Once | INTRAVENOUS | Status: AC
  Administered 2014-12-18: 20 mmol via INTRAVENOUS
  Filled 2014-12-18: qty 6.67

## 2014-12-18 MED ORDER — TRACE MINERALS CR-CU-MN-SE-ZN 10-1000-500-60 MCG/ML IV SOLN
INTRAVENOUS | Status: AC
  Administered 2014-12-18: 17:00:00 via INTRAVENOUS
  Filled 2014-12-18: qty 1080

## 2014-12-18 MED ORDER — MAGNESIUM SULFATE 2 GM/50ML IV SOLN
2.0000 g | Freq: Once | INTRAVENOUS | Status: AC
  Administered 2014-12-18: 2 g via INTRAVENOUS
  Filled 2014-12-18: qty 50

## 2014-12-18 MED ORDER — SODIUM CHLORIDE 0.9 % IV SOLN
510.0000 mg | Freq: Once | INTRAVENOUS | Status: AC
  Administered 2014-12-18: 510 mg via INTRAVENOUS
  Filled 2014-12-18: qty 17

## 2014-12-18 MED ORDER — PANTOPRAZOLE SODIUM 40 MG IV SOLR
40.0000 mg | Freq: Two times a day (BID) | INTRAVENOUS | Status: DC
  Administered 2014-12-18 – 2014-12-25 (×16): 40 mg via INTRAVENOUS
  Filled 2014-12-18 (×18): qty 40

## 2014-12-18 MED ORDER — FAT EMULSION 20 % IV EMUL
240.0000 mL | INTRAVENOUS | Status: AC
  Administered 2014-12-18: 240 mL via INTRAVENOUS
  Filled 2014-12-18: qty 250

## 2014-12-18 NOTE — Progress Notes (Signed)
PARENTERAL NUTRITION CONSULT NOTE  Pharmacy Consult for TPN Indication: perforated duodenal ulcer  Allergies  Allergen Reactions  . Nsaids Other (See Comments)    Perforated ulcer    Patient Measurements: Height: 6\' 2"  (188 cm) Weight: 151 lb 7.3 oz (68.7 kg) IBW/kg (Calculated) : 82.2 . Vital Signs: Temp: 98.5 F (36.9 C) (07/15 0544) Temp Source: Oral (07/15 0544) BP: 123/82 mmHg (07/15 0544) Pulse Rate: 79 (07/15 0544) Intake/Output from previous day: 07/14 0701 - 07/15 0700 In: 2913.3 [I.V.:2048.5; IV Piggyback:300; TPN:564.8] Out: 550 [Urine:550] Intake/Output from this shift:    Labs:  Recent Labs  12/16/14 0403 12/17/14 0325 12/18/14 0540  WBC 8.5 6.4 6.5  HGB 11.0* 10.9* 10.0*  HCT 32.6* 32.0* 29.3*  PLT 128* 150 152     Recent Labs  12/16/14 0403 12/16/14 1435 12/17/14 0325 12/17/14 0804 12/18/14 0540  NA 136  --  138  --  137  K 3.3*  --  3.7  --  3.5  CL 102  --  106  --  107  CO2 23  --  27  --  28  GLUCOSE 69  --  98  --  129*  BUN 17  --  15  --  8  CREATININE 0.77  --  0.80  --  0.61  CALCIUM 8.1*  --  8.2*  --  7.7*  MG  --   --  2.0  --  1.6*  PHOS  --   --  3.0  --  1.8*  PROT  --  6.4* 5.9*  --  5.2*  ALBUMIN  --  2.8* 2.5*  --  2.1*  AST  --  120* 105*  --  121*  ALT  --  106* 92*  --  89*  ALKPHOS  --  69 64  --  58  BILITOT  --  3.7* 2.5*  --  1.7*  BILIDIR  --  1.7*  --   --   --   IBILI  --  2.0*  --   --   --   PREALBUMIN  --   --   --  4.4*  --   Corrected calcium 9.2 Estimated Creatinine Clearance: 93 mL/min (by C-G formula based on Cr of 0.61).    Recent Labs  12/17/14 1640 12/17/14 2348 12/18/14 0549  GLUCAP 121* 116* 114*    Medical History: Past Medical History  Diagnosis Date  . Delirium tremens 09/10/2013  . Dental abscess 09/10/2013  . Knee effusion, left 09/10/2013  . Mental status change 09/10/2013    Insulin Requirements: 1 unit Novolog SSI used in past 24 hours  Current Nutrition:   NPO Clinimix-E 5/15 at 35 mL/hr Fat Emulsion 20% at 10 mL/hr  IVF: D5 LR + 20meq KCl at 90 mL/hr  Central access: PICC placed 7/14 TPN start date: 7/14  ASSESSMENT                                                                                                          HPI: [id number]  YOM presented 7/9 with abdominal pain, underwent lap exploration 7/10 for free air and found to have perforated doudenal bulb ulcer s/p patch repair.  He has alcohol cirrhosis with elevated LFTs.  Baseline malnutrition noted.  Orders to start TPN as patient has been NPO since surgery.   Significant events:  7/10 Surgical repair of perforated DU 7/14 started TPN  Today:    Glucose - CBGs < 150; anticipate SSI requirements may be low in setting of cirrhosis  Electrolytes - K, Mg, Phos all low, consistent with refeeding syndrome  Renal - SCr and BUN WNL  LFTs - AST elevated to ~ 3x ULN.  ALT and TBili elevated but improving.  TGs - pending  Prealbumin - baseline 4.4  NUTRITIONAL GOALS                                                                                             RD recs: [id number] Kcals/d, 75-85 grams protein/d Clinimix-E 5/15 at a goal rate of 72ml/hr + 20% fat emulsion at 33ml/hr will provide 84g/day protein, 1672Kcal/day.  PLAN                                                                                                                         1. Magnesium 2 grams IV x 1 this AM 2. KPhos 20 mM IV x 1 today 3. Follow electrolytes daily through 7/18 given high risk of ongoing refeeding syndrome (see RD recommendation). 4. At 1800 today:  Advance Clinimix-E 5/15 to 45 ml/hr (using slow, cautious rate advancement due to risk of ongoing refeeding syndrome)  Continue 20% fat emulsion at 46ml/hr.  Reduce IVF to 80 mL/hr  Due to risk for hypoglycemia, dextrose was not removed from MIVF when TPN started. Can likely remove dextrose from MIVF once TPN near or at goal.    TPN to contain  standard multivitamins and trace elements.  Tbili elevated and pt previously with possible slight scleral icterus, but bilirubin now steadily improving; no need to reduce or eliminate trace elements at this point.  5. TPN labs on Mondays and Thursdays.  Note patient currently has orders for daily CMet, Mg, and CBC, as well as Phos daily x 2 more days, so will need to enter TPN labs if these orders are changed. 6. Plan to advance TPN rate as tolerated over next few days to goal rate. 7. F/u daily.  Elie Goody, PharmD, BCPS Pager: [phone] 12/18/2014  8:08 AM

## 2014-12-18 NOTE — Progress Notes (Signed)
[facility] PROGRESS NOTE  Alfred [name]:[id number] DOB: 03-20-1951 DOA: 12/12/2014 PCP: No PCP Per Patient  Brief Summary  The patient is a 64 year old male with history of alcohol and tobacco abuse who presented with a 2 day history of upper abdominal pain, nausea, anorexia. He had severe aching right upper quadrant pain that became progressively worse and therefore he came to the hospital. CT scan demonstrated moderate to large amount of free air and free fluid within the abdomen and pelvis tracking along the paracolic gutters consistent with bowel perforation. He also had some soft tissue inflammation around the gallbladder and his liver function tests were elevated concerning for possible cholecystitis. He was found to have a moderate to large right inguinal hernia and also some focal wall thickening of the antrum of the stomach which appeared stable from 2015. He was admitted by general surgery and underwent exploratory laparoscopy. He was found to have a perforated duodenal ulcer and had laparoscopic omental patch of his perforated ulcer as well as core liver biopsies. His postoperative course has been complicated by DTs. His liver biopsy demonstrated early cirrhosis and hepatic steatosis consistent with alcohol abuse. Internal medicine has been consulted secondary to the finding of cirrhosis and the need for follow-up.  Assessment/Plan  Perforated duodenal ulcer status post laparoscopic omental patch on 12/13/2014 with persistent leak - H. Pylori negative  - Counseled EtOH and NSAID avoidance - Continue BID PPI at discharge - F/u with GI scheduled with Dr. Randa Evens - General surgery managing this primarily:  NPO for 1 week with TPN  Alcoholic cirrhosis with liver biopsy performed on 12/13/2014. Hyperbilirubinemia and transaminitis resolving. Does have mild thrombocytopenia but this was more likely related to acute illness since it is quickly resolving. coags are normal.   - Avoid tylenol and tylenol containing medications - Hep A and B neg, given 1st vaccine in series on 7/14 -  HCV Ab + >> RNA quant and genotype pending - LFTs trending down - Counseled EtOH cessation and added cirrhosis education to discharge paperwork  Alcohol withdrawal with elevated CIWA scores trending down - continue q 4 hour CIWA today and if minimal doses of ativan needed, then will d/c tomorrow AM - Continue thiamine, folate - SW consult placed > please provide information about community resources once patient more oriented  Delirium, likely secondary to EtOH w/d and resolving -  D/c sitter  Elevated BP trending down with tx of EtOH w/d - Continue prn metoprolol  Hypokalemia, resolved with IV supplementation. Magnesium within normal limits  Hypoglycemia, resolved.  Normocytic anemia, hgb stable - Iron studies c/w possible iron deficiency although ferritin somewhat elevated (chronic disease), B12 1086, folate 7.1 - TSH 2.192 -  One dose of IV iron  Mild thrombocytopenia, acute phase reactant and resolved  Mildly prolonged QTc - D/c zofran and use phenergan instead - magnesium wnl  Moderate protein calorie malnutrition - TPN   I have requested the patient be scheduled for community health and wellness clinic by the case manager. I will followup again tomorrow. Please contact me if I can be of assistance in the meanwhile. Thank you for this consultation.   HPI/Subjective:  Considerably less confused adn agitated today.  Smiling, able to answer questions: hungry and wants to eat a cheeseburger.  Denies problems breathing, nausea.  Has "hunger pains."    Objective: Filed Vitals:   12/17/14 1200 12/17/14 2129 12/18/14 0544 12/18/14 1308  BP: 162/90 134/94 123/82 128/86  Pulse: 81 80 79 75  Temp: 98.2 F (36.8 C) 98.4 F (36.9 C) 98.5 F (36.9 C) 98.5 F (36.9 C)  TempSrc: Axillary Oral Oral Oral  Resp: 27 21 22 20   Height:      Weight:       SpO2: 99% 97% 96% 97%    Intake/Output Summary (Last 24 hours) at 12/18/14 2053 Last data filed at 12/18/14 0900  Gross per 24 hour  Intake 1763.25 ml  Output    750 ml  Net 1013.25 ml   Filed Weights   12/13/14 0215  Weight: 68.7 kg (151 lb 7.3 oz)   Body mass index is 19.44 kg/(m^2).  Exam:   General:  Adult male, No acute distress  HEENT:  NCAT, MMM  Cardiovascular:  RRR, nl S1, S2 no mrg, 2+ pulses, warm extremities  Respiratory:  CTAB, no increased WOB  Abdomen:   Hypoactive bowel sounds, soft, moderately distended, tender to palpation diffusely to deep palpation without rebound or guarding  MSK:   Normal tone and bulk, no LEE  Neuro:  Grossly moves all extremities  Data Reviewed: Basic Metabolic Panel:  Recent Labs Lab 12/13/14 0555 12/14/14 0340 12/16/14 0403 12/17/14 0325 12/18/14 0540  NA 135 132* 136 138 137  K 4.9 4.1 3.3* 3.7 3.5  CL 108 102 102 106 107  CO2 20* 22 23 27 28   GLUCOSE 105* 82 69 98 129*  BUN 13 15 17 15 8   CREATININE 0.95 0.78 0.77 0.80 0.61  CALCIUM 7.2* 7.5* 8.1* 8.2* 7.7*  MG  --   --   --  2.0 1.6*  PHOS  --   --   --  3.0 1.8*   Liver Function Tests:  Recent Labs Lab 12/13/14 0555 12/14/14 0340 12/16/14 1435 12/17/14 0325 12/18/14 0540  AST 331* 191* 120* 105* 121*  ALT 208* 152* 106* 92* 89*  ALKPHOS 93 78 69 64 58  BILITOT 4.4* 3.9* 3.7* 2.5* 1.7*  PROT 6.0* 5.7* 6.4* 5.9* 5.2*  ALBUMIN 2.7* 2.6* 2.8* 2.5* 2.1*    Recent Labs Lab 12/12/14 1621 12/13/14 0555  LIPASE 36 50    Recent Labs Lab 12/12/14 1931 12/17/14 0804  AMMONIA 31 25   CBC:  Recent Labs Lab 12/12/14 1621 12/13/14 0555 12/14/14 0340 12/16/14 0403 12/17/14 0325 12/18/14 0540  WBC 3.2* 10.7* 12.4* 8.5 6.4 6.5  NEUTROABS 2.4  --   --   --   --  4.3  HGB 13.2 12.8* 11.6* 11.0* 10.9* 10.0*  HCT 37.6* 38.5* 33.9* 32.6* 32.0* 29.3*  MCV 87.4 90.4 89.0 89.3 89.4 89.9  PLT 117* 90* 77* 128* 150 152    Recent Results (from the  past 240 hour(s))  MRSA PCR Screening     Status: None   Collection Time: 12/13/14  2:06 AM  Result Value Ref Range Status   MRSA by PCR NEGATIVE NEGATIVE Final    Comment:        The GeneXpert MRSA Assay (FDA approved for NASAL specimens only), is one component of a comprehensive MRSA colonization surveillance program. It is not intended to diagnose MRSA infection nor to guide or monitor treatment for MRSA infections.   Clostridium Difficile by [facility] (not [facility])     Status: None   Collection Time: 12/16/14  5:38 PM  Result Value Ref Range Status   C difficile by pcr NEGATIVE NEGATIVE Final     Studies: No results found.  Scheduled Meds: . antiseptic oral rinse  7 mL Mouth Rinse BID  .  heparin  5,000 Units Subcutaneous 3 times per day  . insulin aspart  0-9 Units Subcutaneous 4 times per day  . lip balm  1 application Topical BID  . pantoprazole (PROTONIX) IV  40 mg Intravenous Q12H  . piperacillin-tazobactam (ZOSYN)  IV  3.375 g Intravenous Q8H  . sodium chloride  10-40 mL Intracatheter Q12H  . thiamine IV  100 mg Intravenous Daily  . thiamine  100 mg Oral Daily   Continuous Infusions: . Marland KitchenTPN (CLINIMIX-E) Adult 45 mL/hr at 12/18/14 1728   And  . fat emulsion 240 mL (12/18/14 1728)  . dextrose 5% lactated ringers with KCl 20 mEq/L 80 mL/hr at 12/18/14 1800    Principal Problem:   Perforated duodenal bulb ulcer s/p lap omental patch 12/13/2014 Active Problems:   Protein-calorie malnutrition, moderate   Alcohol abuse   Cirrhosis, alcoholic   Tobacco abuse   Colonoscopy refused   Inguinal hernia, right   Family history of colon cancer (father)    Time spent: 30 min    Darwin Rothlisberger, [facility]  [facility] Pager [phone]. If 7PM-7AM, please contact night-coverage at [email or web address], password [facility] 12/18/2014, 8:53 PM  LOS: 5 days

## 2014-12-18 NOTE — Progress Notes (Signed)
Clinical Social Work  CSW received handoff from ICU SW Asher Muir(Jamie) who updated CSW that patient has been assessed for substance use but possibly needs SNF placement. Patient remains confused at this time and unable to discuss DC plans. CSW will continue to follow.  [place] Jsaon [name], [place] [phone]

## 2014-12-18 NOTE — Progress Notes (Signed)
Patient ID: Alfred [name], male   DOB: 1951/02/16, 64 y.o.   MRN: [id number]     [place]      [address] [place], [place] [address]    Phone: [phone] FAX: [phone]     Subjective: More alert.  Pleasant.  Didn't know he had surgery.  VSS.  Afebrile.    Objective:  Vital signs:  Filed Vitals:   12/17/14 0800 12/17/14 1200 12/17/14 2129 12/18/14 0544  BP: 144/85 162/90 134/94 123/82  Pulse: 83 81 80 79  Temp: 98 F (36.7 C) 98.2 F (36.8 C) 98.4 F (36.9 C) 98.5 F (36.9 C)  TempSrc: Oral Axillary Oral Oral  Resp: _0 Height:      Weight:      SpO2: 100% 99% 97% 96%    Last BM Date: 12/18/14  Intake/Output   Yesterday:  07/14 0701 - 07/15 0700 In: 2913.3 [I.V.:2048.5; IV Piggyback:300; TPN:564.8] Out: 550 [Urine:550] This shift:    I/O last 3 completed shifts: In: 4438.3 [I.V.:3423.5; IV Piggyback:450] Out: 900 [Urine:900]   Physical Exam: General: Pt awake/alert/oriented to person and place.  Chest: coarse. No chest wall pain w good excursion CV: Pulses intact. Regular rhythm Abdomen: Soft. distended. Mildly tender at incisions only. Incisions are c/d/i. No evidence of peritonitis. No incarcerated hernias. Ext: SCDs BLE. No mjr edema. No cyanosis Skin: No petechiae / purpura    Problem List:   Principal Problem:   Perforated duodenal bulb ulcer s/p lap omental patch 12/13/2014 Active Problems:   Protein-calorie malnutrition, moderate   Alcohol abuse   Cirrhosis, alcoholic   Tobacco abuse   Colonoscopy refused   Inguinal hernia, right   Family history of colon cancer (father)    Results:   Labs: Results for orders placed or performed during the hospital encounter of 12/12/14 (from the past 48 hour(s))  Hepatic function panel     Status: Abnormal   Collection Time: 12/16/14  2:35 PM  Result Value Ref Range   Total Protein 6.4 (L) 6.5 - 8.1 g/dL   Albumin 2.8 (L) 3.5  - 5.0 g/dL   AST 120 (H) 15 - 41 U/L   ALT 106 (H) 17 - 63 U/L   Alkaline Phosphatase 69 38 - 126 U/L   Total Bilirubin 3.7 (H) 0.3 - 1.2 mg/dL   Bilirubin, Direct 1.7 (H) 0.1 - 0.5 mg/dL   Indirect Bilirubin 2.0 (H) 0.3 - 0.9 mg/dL  Hepatitis A antibody, IgM     Status: None   Collection Time: 12/16/14  2:35 PM  Result Value Ref Range   Hep A IgM Negative Negative    Comment: (NOTE) Performed At: [facility] [place], [place] [id number] Lindon Romp MD AJ:[id number]   Hepatitis B surface antibody     Status: None   Collection Time: 12/16/14  2:35 PM  Result Value Ref Range   Hep B S Ab Non Reactive     Comment: (NOTE)              Non Reactive: Inconsistent with immunity,                            less than 10 mIU/mL              Reactive:     Consistent with immunity,  greater than 9.9 mIU/mL Performed At: [facility] [place], [place] [id number] Lindon Romp MD JG:[id number]   Hepatitis B surface antigen     Status: None   Collection Time: 12/16/14  2:35 PM  Result Value Ref Range   Hepatitis B Surface Ag Negative Negative    Comment: (NOTE) Performed At: [facility] [place], [place] [id number] Lindon Romp MD [id number]   Hepatitis C antibody     Status: Abnormal   Collection Time: 12/16/14  2:35 PM  Result Value Ref Range   HCV Ab >11.0 (H) 0.0 - 0.9 s/co ratio    Comment: (NOTE)                                  Negative:     < 0.8                             Indeterminate: 0.8 - 0.9                                  Positive:     > 0.9 The CDC recommends that a positive HCV antibody result be followed up with a HCV Nucleic Acid Amplification test (749449). Performed At: [facility] [place], [place] [id number] Lindon Romp MD YK:[id number]   Glucose, capillary     Status: Abnormal   Collection Time: 12/16/14  4:31 PM   Result Value Ref Range   Glucose-Capillary 59 (L) 65 - 99 mg/dL  Glucose, capillary     Status: None   Collection Time: 12/16/14  4:55 PM  Result Value Ref Range   Glucose-Capillary 93 65 - 99 mg/dL  Clostridium Difficile by PCR (not at [facility])     Status: None   Collection Time: 12/16/14  5:38 PM  Result Value Ref Range   C difficile by pcr NEGATIVE NEGATIVE  Comprehensive metabolic panel     Status: Abnormal   Collection Time: 12/17/14  3:25 AM  Result Value Ref Range   Sodium 138 135 - 145 mmol/L   Potassium 3.7 3.5 - 5.1 mmol/L   Chloride 106 101 - 111 mmol/L   CO2 27 22 - 32 mmol/L   Glucose, Bld 98 65 - 99 mg/dL   BUN 15 6 - 20 mg/dL   Creatinine, Ser 0.80 0.61 - 1.24 mg/dL   Calcium 8.2 (L) 8.9 - 10.3 mg/dL   Total Protein 5.9 (L) 6.5 - 8.1 g/dL   Albumin 2.5 (L) 3.5 - 5.0 g/dL   AST 105 (H) 15 - 41 U/L   ALT 92 (H) 17 - 63 U/L   Alkaline Phosphatase 64 38 - 126 U/L   Total Bilirubin 2.5 (H) 0.3 - 1.2 mg/dL   GFR calc non Af Amer >60 >60 mL/min   GFR calc Af Amer >60 >60 mL/min    Comment: (NOTE) The eGFR has been calculated using the CKD EPI equation. This calculation has not been validated in all clinical situations. eGFR's persistently <60 mL/min signify possible Chronic Kidney Disease.    Anion gap 5 5 - 15  CBC     Status: Abnormal   Collection Time: 12/17/14  3:25 AM  Result Value Ref Range   WBC 6.4 4.0 - 10.5 K/uL   RBC  3.58 (L) 4.22 - 5.81 MIL/uL   Hemoglobin 10.9 (L) 13.0 - 17.0 g/dL   HCT 32.0 (L) 39.0 - 52.0 %   MCV 89.4 78.0 - 100.0 fL   MCH 30.4 26.0 - 34.0 pg   MCHC 34.1 30.0 - 36.0 g/dL   RDW 15.8 (H) 11.5 - 15.5 %   Platelets 150 150 - 400 K/uL  Magnesium     Status: None   Collection Time: 12/17/14  3:25 AM  Result Value Ref Range   Magnesium 2.0 1.7 - 2.4 mg/dL  Iron and TIBC     Status: Abnormal   Collection Time: 12/17/14  3:25 AM  Result Value Ref Range   Iron 44 (L) 45 - 182 ug/dL   TIBC 277 250 - 450 ug/dL   Saturation Ratios 16  (L) 17.9 - 39.5 %   UIBC 233 ug/dL    Comment: Performed at [facility]  Ferritin     Status: None   Collection Time: 12/17/14  3:25 AM  Result Value Ref Range   Ferritin 273 24 - 336 ng/mL    Comment: Performed at [facility]  Vitamin B12     Status: Abnormal   Collection Time: 12/17/14  3:25 AM  Result Value Ref Range   Vitamin B-12 1086 (H) 180 - 914 pg/mL    Comment: (NOTE) This assay is not validated for testing neonatal or myeloproliferative syndrome specimens for Vitamin B12 levels. Performed at [facility]   Folate     Status: None   Collection Time: 12/17/14  3:25 AM  Result Value Ref Range   Folate 7.1 >5.9 ng/mL    Comment: Performed at [facility]  TSH     Status: None   Collection Time: 12/17/14  3:25 AM  Result Value Ref Range   TSH 2.192 0.350 - 4.500 uIU/mL  Phosphorus     Status: None   Collection Time: 12/17/14  3:25 AM  Result Value Ref Range   Phosphorus 3.0 2.5 - 4.6 mg/dL  Ammonia     Status: None   Collection Time: 12/17/14  8:04 AM  Result Value Ref Range   Ammonia 25 9 - 35 umol/L  Prealbumin     Status: Abnormal   Collection Time: 12/17/14  8:04 AM  Result Value Ref Range   Prealbumin 4.4 (L) 18 - 38 mg/dL    Comment: Performed at [facility]  Glucose, capillary     Status: None   Collection Time: 12/17/14  8:16 AM  Result Value Ref Range   Glucose-Capillary 94 65 - 99 mg/dL   Comment 1 Notify RN    Comment 2 Document in Chart   Glucose, capillary     Status: Abnormal   Collection Time: 12/17/14  4:40 PM  Result Value Ref Range   Glucose-Capillary 121 (H) 65 - 99 mg/dL   Comment 1 Notify RN   Glucose, capillary     Status: Abnormal   Collection Time: 12/17/14 11:48 PM  Result Value Ref Range   Glucose-Capillary 116 (H) 65 - 99 mg/dL  Comprehensive metabolic panel     Status: Abnormal   Collection Time: 12/18/14  5:40 AM  Result Value Ref Range   Sodium 137 135 - 145 mmol/L    Comment: REPEATED  TO VERIFY   Potassium 3.5 3.5 - 5.1 mmol/L   Chloride 107 101 - 111 mmol/L    Comment: REPEATED TO VERIFY   CO2 28 22 - 32  mmol/L    Comment: REPEATED TO VERIFY   Glucose, Bld 129 (H) 65 - 99 mg/dL   BUN 8 6 - 20 mg/dL   Creatinine, Ser 0.61 0.61 - 1.24 mg/dL   Calcium 7.7 (L) 8.9 - 10.3 mg/dL   Total Protein 5.2 (L) 6.5 - 8.1 g/dL   Albumin 2.1 (L) 3.5 - 5.0 g/dL   AST 121 (H) 15 - 41 U/L   ALT 89 (H) 17 - 63 U/L   Alkaline Phosphatase 58 38 - 126 U/L   Total Bilirubin 1.7 (H) 0.3 - 1.2 mg/dL   GFR calc non Af Amer >60 >60 mL/min   GFR calc Af Amer >60 >60 mL/min    Comment: (NOTE) The eGFR has been calculated using the CKD EPI equation. This calculation has not been validated in all clinical situations. eGFR's persistently <60 mL/min signify possible Chronic Kidney Disease.    Anion gap 2 (L) 5 - 15    Comment: REPEATED TO VERIFY  CBC     Status: Abnormal   Collection Time: 12/18/14  5:40 AM  Result Value Ref Range   WBC 6.5 4.0 - 10.5 K/uL   RBC 3.26 (L) 4.22 - 5.81 MIL/uL   Hemoglobin 10.0 (L) 13.0 - 17.0 g/dL   HCT 29.3 (L) 39.0 - 52.0 %   MCV 89.9 78.0 - 100.0 fL   MCH 30.7 26.0 - 34.0 pg   MCHC 34.1 30.0 - 36.0 g/dL   RDW 15.9 (H) 11.5 - 15.5 %   Platelets 152 150 - 400 K/uL  Magnesium     Status: Abnormal   Collection Time: 12/18/14  5:40 AM  Result Value Ref Range   Magnesium 1.6 (L) 1.7 - 2.4 mg/dL  Phosphorus     Status: Abnormal   Collection Time: 12/18/14  5:40 AM  Result Value Ref Range   Phosphorus 1.8 (L) 2.5 - 4.6 mg/dL  Triglycerides     Status: None   Collection Time: 12/18/14  5:40 AM  Result Value Ref Range   Triglycerides 132 <150 mg/dL    Comment: Performed at [facility]  Differential     Status: Abnormal   Collection Time: 12/18/14  5:40 AM  Result Value Ref Range   Neutrophils Relative % 68 43 - 77 %   Neutro Abs 4.3 1.7 - 7.7 K/uL   Lymphocytes Relative 10 (L) 12 - 46 %   Lymphs Abs 0.7 0.7 - 4.0 K/uL   Monocytes Relative 21  (H) 3 - 12 %   Monocytes Absolute 1.4 (H) 0.1 - 1.0 K/uL   Eosinophils Relative 1 0 - 5 %   Eosinophils Absolute 0.1 0.0 - 0.7 K/uL   Basophils Relative 0 0 - 1 %   Basophils Absolute 0.0 0.0 - 0.1 K/uL  Glucose, capillary     Status: Abnormal   Collection Time: 12/18/14  5:49 AM  Result Value Ref Range   Glucose-Capillary 114 (H) 65 - 99 mg/dL    Imaging / Studies: Dg Ugi W/water Sol Cm  12/16/2014   CLINICAL DATA:  Patient status post surgical repair of perforated duodenum ulcer.  EXAM: WATER SOLUBLE UPPER GI SERIES  TECHNIQUE: Single-column upper GI series was performed using water soluble contrast.  CONTRAST:  200 mL Omnipaque  COMPARISON:  CT 12/12/2014  FLUOROSCOPY TIME:  Radiation Exposure Index (as provided by the fluoroscopic device):  If the device does not provide the exposure index:  Fluoroscopy Time (in minutes and seconds):  2 minutes  35 seconds  Number of Acquired Images:  16  FINDINGS: Oral contrast readily flows through the esophagus into the stomach. Contrast readily flowed out to the duodenum bulb and C-loop of the duodenum and ultimately the proximal small bowel.  Along the proximal portion of the second portion duodenum just distal to the duodenum bulb there is a thin stream of contrast which connects to a approximately 2 cm x 1 cm collection of contrast. This small pocket of contrast has a stalk and cap appearance. No contrast extends beyond this small extraluminal pouch adjacent to the first portion the duodenum.  IMPRESSION: 1. Small pocket of extraluminal contrast connected to the first portion duodenum via a thin channel presumably represents a small contained leak from the omental patch. 2. No evidence of obstruction or leak otherwise. Findings conveyed toDr. Alta Corning 12/16/2014  at11:26.   Electronically Signed   By: Suzy Bouchard M.D.   On: 12/16/2014 11:27    Medications / Allergies:  Scheduled Meds: . antiseptic oral rinse  7 mL Mouth Rinse BID  . heparin  5,000  Units Subcutaneous 3 times per day  . insulin aspart  0-9 Units Subcutaneous 4 times per day  . lip balm  1 application Topical BID  . magnesium sulfate 1 - 4 g bolus IVPB  2 g Intravenous Once  . pantoprazole (PROTONIX) IV  80 mg Intravenous Q12H  . piperacillin-tazobactam (ZOSYN)  IV  3.375 g Intravenous Q8H  . potassium phosphate IVPB (mmol)  20 mmol Intravenous Once  . sodium chloride  10-40 mL Intracatheter Q12H  . thiamine IV  100 mg Intravenous Daily  . thiamine  100 mg Oral Daily   Continuous Infusions: . Marland KitchenTPN (CLINIMIX-E) Adult     And  . fat emulsion    . dextrose 5% lactated ringers with KCl 20 mEq/L 90 mL/hr at 12/18/14 0215  . dextrose 5% lactated ringers with KCl 20 mEq/L    . Marland KitchenTPN (CLINIMIX-E) Adult 35 mL/hr at 12/17/14 1727   And  . fat emulsion 240 mL (12/17/14 1727)   PRN Meds:.acetaminophen, alum & mag hydroxide-simeth, bisacodyl, diphenhydrAMINE, HYDROmorphone (DILAUDID) injection, magic mouthwash, menthol-cetylpyridinium, methocarbamol (ROBAXIN)  IV, metoprolol, phenol, promethazine, sodium chloride  Antibiotics: Anti-infectives    Start     Dose/Rate Route Frequency Ordered Stop   12/13/14 1000  piperacillin-tazobactam (ZOSYN) IVPB 3.375 g     3.375 g 12.5 mL/hr over 240 Minutes Intravenous Every 8 hours 12/13/14 0940 12/20/14 0959   12/12/14 2245  clindamycin (CLEOCIN) 900 mg, gentamicin (GARAMYCIN) 240 mg in sodium chloride 0.9 % 1,000 mL for intraperitoneal lavage  Status:  Discontinued    Comments:  Pharmacy may adjust dosing strength, schedule, rate of infusion, etc as needed to optimize therapy    Intraperitoneal To Surgery 12/12/14 2238 12/13/14 0205   12/12/14 2030  piperacillin-tazobactam (ZOSYN) IVPB 3.375 g     3.375 g 100 mL/hr over 30 Minutes Intravenous  Once 12/12/14 2020 12/12/14 2140       Assessment/Plan POD#6 laparoscopic omental patch of perforated duodenal bulb ulcer and core liver biopsy---Dr. Johney Maine  -UGI showed small contained  leak 7/13. Keep NPO.  Attending to decide when to study again.  -Protonix 37m BID, then 434mBID when tolerating PO x8 weeks -will need EGD in 6-8 weeks to ensure ulcer has healed v. Biopsy. Also needs a screening colonoscopy at this time. -pain control, mobilize, pulmonary toilet -Protonix 4043mV ID-zosyn D#6/7.H pylori negative.  Cirrhosis, Alcohol and HCV-biopsy shows steatohepatitis and  early cirrhosis on biopsy. HCV RNA pending. Appreciate medicine management.  Delirium-continue CIWA protocol. Appears more alert.  VTE prophylaxis-SCD/heparin FEN-no NGT at this time. NPO for leak. PCM-severe, prealbumin 4.4 yesterday. On TPN Dispo-continue inpatient. PT/OT  No family at bedside called all listed contacts 7/14 but unable to reach.   Erby Pian, [facility] Surgery Pager [phone](7A-4:30P)   12/18/2014 9:05 AM

## 2014-12-18 NOTE — Progress Notes (Signed)
Physical Therapy Treatment Patient Details Name: Alfred AlfWilliam S Pierro MRN: [id number] DOB: 1950/07/02 Today's Date: 12/18/2014    History of Present Illness  Pt is a [age] year old male admitted 12/11/14 with abdominal pain, H/O ETOH, L knee effusion. Perforated duodenal bulb ulcer s/p lap omental patch 12/13/2014    PT Comments    RN reports pt awake most of the night and sleeping most of today.  Pt very groggy however awake during session.  Pt requiring increased assist today with bed mobility and even unable to stand erect.  Continue to recommend SNF upon d/c.   Follow Up Recommendations  SNF     Equipment Recommendations  Rolling walker with 5" wheels    Recommendations for Other Services       Precautions / Restrictions Precautions Precautions: Fall    Mobility  Bed Mobility Overal bed mobility: Needs Assistance;+2 for physical assistance Bed Mobility: Rolling;Supine to Sit;Sit to Supine Rolling: Max assist;+2 for physical assistance   Supine to sit: Max assist;+2 for physical assistance Sit to supine: +2 for physical assistance;Max assist   General bed mobility comments: pt requiring increased assist for all mobility, pt initiated however unable to complete unassisted  Transfers Overall transfer level: Needs assistance Equipment used: 2 person hand held assist Transfers: Sit to/from Stand Sit to Stand: Max assist;+2 physical assistance         General transfer comment: attempted standing twice however pt unable to stand fully erect due to weakness  Ambulation/Gait                 Stairs            Wheelchair Mobility    Modified Rankin (Stroke Patients Only)       Balance                                    Cognition Arousal/Alertness: Awake/alert   Overall Cognitive Status: Difficult to assess                      Exercises      General Comments        Pertinent Vitals/Pain Pain Assessment: Faces Faces Pain  Scale: Hurts even more Pain Location: "everywhere" Pain Descriptors / Indicators: Discomfort;Grimacing Pain Intervention(s): Monitored during session;Repositioned    Home Living                      Prior Function            PT Goals (current goals can now be found in the care plan section) Progress towards PT goals: Progressing toward goals    Frequency  Min 3X/week    PT Plan Current plan remains appropriate    Co-evaluation             End of Session   Activity Tolerance: Patient limited by fatigue Patient left: in bed;with call bell/phone within reach;with bed alarm set;with nursing/sitter in room     Time: 1351-1409 PT Time Calculation (min) (ACUTE ONLY): 18 min  Charges:  $Therapeutic Activity: 8-22 mins                    G Codes:      Marisa Hage,KATHrine E 12/18/2014, 2:55 PM Zenovia JarredKati Nichola Warren, PT, DPT 12/18/2014 Pager: [phone]

## 2014-12-19 LAB — GLUCOSE, CAPILLARY
GLUCOSE-CAPILLARY: 106 mg/dL — AB (ref 65–99)
GLUCOSE-CAPILLARY: 106 mg/dL — AB (ref 65–99)
Glucose-Capillary: 105 mg/dL — ABNORMAL HIGH (ref 65–99)
Glucose-Capillary: 114 mg/dL — ABNORMAL HIGH (ref 65–99)

## 2014-12-19 LAB — MAGNESIUM: Magnesium: 1.9 mg/dL (ref 1.7–2.4)

## 2014-12-19 LAB — COMPREHENSIVE METABOLIC PANEL
ALK PHOS: 61 U/L (ref 38–126)
ALT: 87 U/L — ABNORMAL HIGH (ref 17–63)
AST: 95 U/L — ABNORMAL HIGH (ref 15–41)
Albumin: 2.3 g/dL — ABNORMAL LOW (ref 3.5–5.0)
Anion gap: 5 (ref 5–15)
BILIRUBIN TOTAL: 1.6 mg/dL — AB (ref 0.3–1.2)
BUN: 8 mg/dL (ref 6–20)
CHLORIDE: 102 mmol/L (ref 101–111)
CO2: 27 mmol/L (ref 22–32)
Calcium: 8.1 mg/dL — ABNORMAL LOW (ref 8.9–10.3)
Creatinine, Ser: 0.63 mg/dL (ref 0.61–1.24)
GFR calc non Af Amer: >60 mL/min (ref >60)
GLUCOSE: 117 mg/dL — AB (ref 65–99)
POTASSIUM: 3.9 mmol/L (ref 3.5–5.1)
Sodium: 134 mmol/L — ABNORMAL LOW (ref 135–145)
TOTAL PROTEIN: 5.5 g/dL — AB (ref 6.5–8.1)

## 2014-12-19 LAB — CBC
HEMATOCRIT: 31.9 % — AB (ref 39.0–52.0)
HEMOGLOBIN: 10.5 g/dL — AB (ref 13.0–17.0)
MCH: 29.3 pg (ref 26.0–34.0)
MCHC: 32.9 g/dL (ref 30.0–36.0)
MCV: 89.1 fL (ref 78.0–100.0)
Platelets: 168 10*3/uL (ref 150–400)
RBC: 3.58 MIL/uL — AB (ref 4.22–5.81)
RDW: 15.8 % — ABNORMAL HIGH (ref 11.5–15.5)
WBC: 9.5 10*3/uL (ref 4.0–10.5)

## 2014-12-19 LAB — PHOSPHORUS: Phosphorus: 2.9 mg/dL (ref 2.5–4.6)

## 2014-12-19 MED ORDER — METOPROLOL TARTRATE 1 MG/ML IV SOLN
5.0000 mg | Freq: Four times a day (QID) | INTRAVENOUS | Status: DC
  Administered 2014-12-19 – 2014-12-26 (×28): 5 mg via INTRAVENOUS
  Filled 2014-12-19 (×35): qty 5

## 2014-12-19 MED ORDER — M.V.I. ADULT IV INJ
INJECTION | INTRAVENOUS | Status: AC
  Administered 2014-12-19: 18:00:00 via INTRAVENOUS
  Filled 2014-12-19: qty 1320

## 2014-12-19 MED ORDER — KCL-LACTATED RINGERS-D5W 20 MEQ/L IV SOLN
INTRAVENOUS | Status: AC
  Administered 2014-12-19: 18:00:00 via INTRAVENOUS
  Filled 2014-12-19 (×2): qty 1000

## 2014-12-19 MED ORDER — FAT EMULSION 20 % IV EMUL
240.0000 mL | INTRAVENOUS | Status: AC
  Administered 2014-12-19: 240 mL via INTRAVENOUS
  Filled 2014-12-19: qty 250

## 2014-12-19 NOTE — Clinical Social Work Note (Signed)
[place]  .Alfred Bubaegina Samirah Scarpati, LCSW [facility] Clinical Social Work[facility] - Weekend Coverage cell #: [phone]

## 2014-12-19 NOTE — Progress Notes (Signed)
[facility] PROGRESS NOTE  Alfred [name] NWG:[id number] DOB: [birth year] DOA: 12/12/2014 PCP: No PCP Per Patient  Brief Summary  The patient is a 64 year old male with history of alcohol and tobacco abuse who presented with a 2 day history of upper abdominal pain, nausea, anorexia. He had severe aching right upper quadrant pain that became progressively worse and therefore he came to the hospital. CT scan demonstrated moderate to large amount of free air and free fluid within the abdomen and pelvis tracking along the paracolic gutters consistent with bowel perforation. He also had some soft tissue inflammation around the gallbladder and his liver function tests were elevated concerning for possible cholecystitis. He was found to have a moderate to large right inguinal hernia and also some focal wall thickening of the antrum of the stomach which appeared stable from 2015. He was admitted by general surgery and underwent exploratory laparoscopy. He was found to have a perforated duodenal ulcer and had laparoscopic omental patch of his perforated ulcer as well as core liver biopsies. His postoperative course has been complicated by DTs. His liver biopsy demonstrated early cirrhosis and hepatic steatosis consistent with alcohol abuse. Internal medicine has been consulted secondary to the finding of cirrhosis and the need for follow-up.  Assessment/Plan  Perforated duodenal ulcer status post laparoscopic omental patch on 12/13/2014 with persistent leak - H. Pylori negative  - Counseled EtOH and NSAID avoidance - Continue BID PPI at discharge - F/u with GI scheduled with Dr. Randa EvensEdwards - General surgery managing  Alcoholic cirrhosis with liver biopsy performed on 12/13/2014. Does have mild thrombocytopenia but this was more likely related to acute illness since it is quickly resolving. coags are normal.  - Avoid tylenol and tylenol containing medications - Counseled EtOH cessation and added  cirrhosis education to discharge paperwork  HCV Ab positive - Hep A and B neg, given 1st vaccine in series on 7/14 -  HCV Ab + >> RNA quant and genotype pending - LFTs trending down  Alcohol withdrawal, resolved - d/c CIWA and prn ativan - Continue thiamine, folate - SW consult placed > please provide information about community resources once patient more oriented  Delirium, likely secondary to EtOH w/d and acute illness and resolving  Elevated BP still elevated -  Schedule metoprolol  - Continue prn metoprolol -  If still elevated, start clonidine patch 0.1mg    Hypokalemia, resolved with IV supplementation. Magnesium within normal limits  Hypoglycemia, resolved.  Normocytic anemia, hgb stable - Iron studies c/w possible iron deficiency although ferritin somewhat elevated (chronic disease), B12 1086, folate 7.1 - TSH 2.192 -  Given feraheme on 7/15 x 1  Mild thrombocytopenia, acute phase reactant and resolved  Mildly prolonged QTc - D/c zofran and use phenergan instead - magnesium wnl  Moderate protein calorie malnutrition - TPN   I have requested the patient be scheduled for community health and wellness clinic by the case manager. I will followup again tomorrow. Please contact me if I can be of assistance in the meanwhile. Thank you for this consultation.   HPI/Subjective:  More oriented today.  Denies pain, SOB, nausea.  Passing gas, no recent BM  Objective: Filed Vitals:   12/18/14 1308 12/18/14 2159 12/19/14 0556 12/19/14 1404  BP: 128/86 152/93 154/97 155/101  Pulse: 75 93 88 90  Temp: 98.5 F (36.9 C) 98.8 F (37.1 C) 98.1 F (36.7 C) 98.1 F (36.7 C)  TempSrc: Oral Oral Oral Oral  Resp: 20 20 20 20   Height:  Weight:      SpO2: 97% 100% 95% 93%    Intake/Output Summary (Last 24 hours) at 12/19/14 1646 Last data filed at 12/19/14 1300  Gross per 24 hour  Intake   1227 ml  Output   2000 ml  Net   -773 ml   Filed Weights    12/13/14 0215  Weight: 68.7 kg (151 lb 7.3 oz)   Body mass index is 19.44 kg/(m^2).  Exam:   General:  Adult male, No acute distress, smiling, making good eye contact and appropriately answering questions  HEENT:  NCAT, MMM  Cardiovascular:  RRR, nl S1, S2 no mrg, 2+ pulses, warm extremities  Respiratory:  CTAB, no increased WOB  Abdomen:   Hypoactive bowel sounds, soft, moderately distended, tender to palpation diffusely to deep palpation without rebound or guarding  MSK:   Normal tone and bulk, no LEE  Neuro:  Grossly moves all extremities  Data Reviewed: Basic Metabolic Panel:  Recent Labs Lab 12/14/14 0340 12/16/14 0403 12/17/14 0325 12/18/14 0540 12/19/14 0443  NA 132* 136 138 137 134*  K 4.1 3.3* 3.7 3.5 3.9  CL 102 102 106 107 102  CO2 GLUCOSE 82 69 98 129* 117*  BUN CREATININE 0.78 0.77 0.80 0.61 0.63  CALCIUM 7.5* 8.1* 8.2* 7.7* 8.1*  MG  --   --  2.0 1.6* 1.9  PHOS  --   --  3.0 1.8* 2.9   Liver Function Tests:  Recent Labs Lab 12/14/14 0340 12/16/14 1435 12/17/14 0325 12/18/14 0540 12/19/14 0443  AST 191* 120* 105* 121* 95*  ALT 152* 106* 92* 89* 87*  ALKPHOS 78 69 64 58 61  BILITOT 3.9* 3.7* 2.5* 1.7* 1.6*  PROT 5.7* 6.4* 5.9* 5.2* 5.5*  ALBUMIN 2.6* 2.8* 2.5* 2.1* 2.3*    Recent Labs Lab 12/13/14 0555  LIPASE 50    Recent Labs Lab 12/12/14 1931 12/17/14 0804  AMMONIA 31 25   CBC:  Recent Labs Lab 12/14/14 0340 12/16/14 0403 12/17/14 0325 12/18/14 0540 12/19/14 0443  WBC 12.4* 8.5 6.4 6.5 9.5  NEUTROABS  --   --   --  4.3  --   HGB 11.6* 11.0* 10.9* 10.0* 10.5*  HCT 33.9* 32.6* 32.0* 29.3* 31.9*  MCV 89.0 89.3 89.4 89.9 89.1  PLT 77* 128* 150 152 168    Recent Results (from the past 240 hour(s))  MRSA PCR Screening     Status: None   Collection Time: 12/13/14  2:06 AM  Result Value Ref Range Status   MRSA by PCR NEGATIVE NEGATIVE Final    Comment:        The GeneXpert MRSA Assay  (FDA approved for NASAL specimens only), is one component of a comprehensive MRSA colonization surveillance program. It is not intended to diagnose MRSA infection nor to guide or monitor treatment for MRSA infections.   Clostridium Difficile by PCR (not at [facility])     Status: None   Collection Time: 12/16/14  5:38 PM  Result Value Ref Range Status   C difficile by pcr NEGATIVE NEGATIVE Final     Studies: No results found.  Scheduled Meds: . antiseptic oral rinse  7 mL Mouth Rinse BID  . heparin  5,000 Units Subcutaneous 3 times per day  . insulin aspart  0-9 Units Subcutaneous 4 times per day  . lip balm  1 application Topical BID  . metoprolol  5 mg Intravenous 4  times per day  . pantoprazole (PROTONIX) IV  40 mg Intravenous Q12H  . piperacillin-tazobactam (ZOSYN)  IV  3.375 g Intravenous Q8H  . sodium chloride  10-40 mL Intracatheter Q12H  . thiamine IV  100 mg Intravenous Daily  . thiamine  100 mg Oral Daily   Continuous Infusions: . Marland KitchenTPN (CLINIMIX-E) Adult 45 mL/hr at 12/18/14 1728   And  . fat emulsion 240 mL (12/18/14 1728)  . dextrose 5% lactated ringers with KCl 20 mEq/L    . Marland KitchenTPN (CLINIMIX-E) Adult     And  . fat emulsion      Principal Problem:   Perforated duodenal bulb ulcer s/p lap omental patch 12/13/2014 Active Problems:   Protein-calorie malnutrition, moderate   Alcohol abuse   Cirrhosis, alcoholic   Tobacco abuse   Colonoscopy refused   Inguinal hernia, right   Family history of colon cancer (father)    Time spent: 30 min    Jezreel Justiniano, [facility]  [facility] Pager [phone]. If 7PM-7AM, please contact night-coverage at [email or web address], password [facility] 12/19/2014, 4:46 PM  LOS: 6 days

## 2014-12-19 NOTE — Progress Notes (Addendum)
PARENTERAL NUTRITION CONSULT NOTE  Pharmacy Consult for TPN Indication: perforated duodenal ulcer  Allergies  Allergen Reactions  . Nsaids Other (See Comments)    Perforated ulcer    Patient Measurements: Height: 6\' 2"  (188 cm) Weight: 151 lb 7.3 oz (68.7 kg) IBW/kg (Calculated) : 82.2 . Vital Signs: Temp: 98.1 F (36.7 C) (07/16 0556) Temp Source: Oral (07/16 0556) BP: 154/97 mmHg (07/16 0556) Pulse Rate: 88 (07/16 0556) Intake/Output from previous day: 07/15 0701 - 07/16 0700 In: 1227 [I.V.:960; IV Piggyback:267] Out: 1550 [Urine:1550] Intake/Output from this shift:    Labs:  Recent Labs  12/17/14 0325 12/18/14 0540 12/19/14 0443  WBC 6.4 6.5 9.5  HGB 10.9* 10.0* 10.5*  HCT 32.0* 29.3* 31.9*  PLT 150 152 168     Recent Labs  12/16/14 1435 12/17/14 0325 12/17/14 0804 12/18/14 0540 12/19/14 0443  NA  --  138  --  137 134*  K  --  3.7  --  3.5 3.9  CL  --  106  --  107 102  CO2  --  27  --  28 27  GLUCOSE  --  98  --  129* 117*  BUN  --  15  --  8 8  CREATININE  --  0.80  --  0.61 0.63  CALCIUM  --  8.2*  --  7.7* 8.1*  MG  --  2.0  --  1.6* 1.9  PHOS  --  3.0  --  1.8* 2.9  PROT 6.4* 5.9*  --  5.2* 5.5*  ALBUMIN 2.8* 2.5*  --  2.1* 2.3*  AST 120* 105*  --  121* 95*  ALT 106* 92*  --  89* 87*  ALKPHOS 69 64  --  58 61  BILITOT 3.7* 2.5*  --  1.7* 1.6*  BILIDIR 1.7*  --   --   --   --   IBILI 2.0*  --   --   --   --   PREALBUMIN  --   --  4.4*  --   --   TRIG  --   --   --  132  --   Corrected calcium 9.2 Estimated Creatinine Clearance: 93 mL/min (by C-G formula based on Cr of 0.63).    Recent Labs  12/18/14 1954 12/19/14 0008 12/19/14 0552  GLUCAP 114* 114* 106*    Medical History: Past Medical History  Diagnosis Date  . Delirium tremens 09/10/2013  . Dental abscess 09/10/2013  . Knee effusion, left 09/10/2013  . Mental status change 09/10/2013    Insulin Requirements: 0 unit Novolog SSI used in past 24 hours  Current Nutrition:   NPO Clinimix-E 5/15 at 35 mL/hr Fat Emulsion 20% at 10 mL/hr  IVF: D5 LR + 20meq KCl at 80 mL/hr  Central access: PICC placed 7/14 TPN start date: 7/14  ASSESSMENT                                                                                                          HPI: [id number] YOM presented  7/9 with abdominal pain, underwent lap exploration 7/10 for free air and found to have perforated doudenal bulb ulcer s/p patch repair.  He has alcohol cirrhosis with elevated LFTs.  Baseline malnutrition noted.  Orders to start TPN as patient has been NPO since surgery.   Significant events:  7/10 Surgical repair of perforated DU 7/14 started TPN  Today:    Glucose - CBGs < 150; anticipate SSI requirements may be low in setting of cirrhosis  Electrolytes - Na = 134K; K, Mg, Phos improved following replacement 7/15  Renal - SCr and BUN WNL  I/O per 24h: -  LFTs - AST elevated to ~ 3x ULN.  AST, ALT and TBili elevated but improving.  TGs - 132 (7/15)  Prealbumin - baseline 4.4  NUTRITIONAL GOALS                                                                                             RD recs: 9604-5409 Kcals/d, 75-85 grams protein/d Clinimix-E 5/15 at a goal rate of 79ml/hr + 20% fat emulsion at 52ml/hr will provide 84g/day protein, 1672Kcal/day.  PLAN                                                                                                                         1. Follow electrolytes daily through 7/18 given high risk of ongoing refeeding syndrome (see RD recommendation). 4. At 1800 today:  Advance Clinimix-E 5/15 to 55 ml/hr (using slow, cautious rate advancement due to risk of ongoing refeeding syndrome)  Continue 20% fat emulsion at 22ml/hr.  Reduce IVF to 70 mL/hr  Due to risk for hypoglycemia, dextrose was not removed from MIVF when TPN started. Can likely remove dextrose from MIVF once TPN near or at goal.    TPN to contain standard multivitamins and trace  elements.  Tbili elevated  with possible slight scleral icterus, but bilirubin now steadily improving; no need to reduce or eliminate trace elements at this point.  3. TPN labs on Mondays and Thursdays.  Note patient currently has orders for daily CMet, Mg, and CBC, as well as Phos daily x 2 more days, so will need to enter TPN labs if these orders are changed. 4. Plan to advance TPN rate as tolerated over next few days to goal rate. 5. F/u daily.  Juliette Alcide, PharmD, BCPS.   Pager: [phone] 12/19/2014  10:24 AM

## 2014-12-19 NOTE — Progress Notes (Signed)
Patient ID: Alfred [name], male   DOB: 13-Nov-1950, 64 y.o.   MRN: [id number] 7 Days Post-Op  Subjective: He denies abdominal pain or nausea. States he has had some flatus and bowel movements.  Objective: Vital signs in last 24 hours: Temp:  [98.1 F (36.7 C)-98.8 F (37.1 C)] 98.1 F (36.7 C) (07/16 0556) Pulse Rate:  [75-93] 88 (07/16 0556) Resp:  [20] 20 (07/16 0556) BP: (128-154)/(86-97) 154/97 mmHg (07/16 0556) SpO2:  [95 %-100 %] 95 % (07/16 0556) Last BM Date: 12/18/14  Intake/Output from previous day: 07/15 0701 - 07/16 0700 In: 1227 [I.V.:960; IV Piggyback:267] Out: 1550 [Urine:1550] Intake/Output this shift:    General appearance: drowsy and somewhat chronically ill-appearing but responds well to questions Resp: some upper airway congestion GI: mild distention. Soft without apparent tenderness. Incision/Wound: clean and dry.  Lab Results:   Recent Labs  12/18/14 0540 12/19/14 0443  WBC 6.5 9.5  HGB 10.0* 10.5*  HCT 29.3* 31.9*  PLT 152 168   BMET  Recent Labs  12/18/14 0540 12/19/14 0443  NA 137 134*  K 3.5 3.9  CL 107 102  CO2 28 27  GLUCOSE 129* 117*  BUN 8 8  CREATININE 0.61 0.63  CALCIUM 7.7* 8.1*     Studies/Results: No results found.  Anti-infectives: Anti-infectives    Start     Dose/Rate Route Frequency Ordered Stop   12/13/14 1000  piperacillin-tazobactam (ZOSYN) IVPB 3.375 g     3.375 g 12.5 mL/hr over 240 Minutes Intravenous Every 8 hours 12/13/14 0940 12/20/14 0959   12/12/14 2245  clindamycin (CLEOCIN) 900 mg, gentamicin (GARAMYCIN) 240 mg in sodium chloride 0.9 % 1,000 mL for intraperitoneal lavage  Status:  Discontinued    Comments:  Pharmacy may adjust dosing strength, schedule, rate of infusion, etc as needed to optimize therapy    Intraperitoneal To Surgery 12/12/14 2238 12/13/14 0205   12/12/14 2030  piperacillin-tazobactam (ZOSYN) IVPB 3.375 g     3.375 g 100 mL/hr over 30 Minutes Intravenous  Once 12/12/14 2020  12/12/14 2140      Assessment/Plan: POD#7 laparoscopic omental patch of perforated duodenal bulb ulcer and core liver biopsy---Dr. Michaell CowingGross  -UGI showed small contained leak 7/13. Keep NPO. we will plan to repeat study Monday, July 18  -Protonix 80mg  BID, then 40mg  BID when tolerating PO x8 weeks -will need EGD in 6-8 weeks to ensure ulcer has healed v. Biopsy. Also needs a screening colonoscopy at this time. -pain control, mobilize, pulmonary toilet -Protonix 40mg  IV ID-zosyn D#6/7.H pylori negative.  Cirrhosis, Alcohol and HCV-biopsy shows steatohepatitis and early cirrhosis on biopsy. HCV RNA pending. Appreciate medicine management.  Delirium-continue CIWA protocol. Appears more alert.  VTE prophylaxis-SCD/heparin FEN-no NGT at this time. NPO for leak. PCM-severe, prealbumin 4.4 yesterday. On TPN Dispo-continue inpatient. PT/OT     LOS: 6 days    Gaetana Kawahara T 12/19/2014

## 2014-12-20 DIAGNOSIS — R7989 Other specified abnormal findings of blood chemistry: Secondary | ICD-10-CM | POA: Insufficient documentation

## 2014-12-20 DIAGNOSIS — M25462 Effusion, left knee: Secondary | ICD-10-CM

## 2014-12-20 DIAGNOSIS — B1920 Unspecified viral hepatitis C without hepatic coma: Secondary | ICD-10-CM | POA: Insufficient documentation

## 2014-12-20 DIAGNOSIS — R945 Abnormal results of liver function studies: Secondary | ICD-10-CM | POA: Insufficient documentation

## 2014-12-20 DIAGNOSIS — IMO0001 Reserved for inherently not codable concepts without codable children: Secondary | ICD-10-CM

## 2014-12-20 DIAGNOSIS — R030 Elevated blood-pressure reading, without diagnosis of hypertension: Secondary | ICD-10-CM

## 2014-12-20 LAB — CBC
HCT: 29.2 % — ABNORMAL LOW (ref 39.0–52.0)
Hemoglobin: 10.1 g/dL — ABNORMAL LOW (ref 13.0–17.0)
MCH: 30.5 pg (ref 26.0–34.0)
MCHC: 34.6 g/dL (ref 30.0–36.0)
MCV: 88.2 fL (ref 78.0–100.0)
PLATELETS: 210 10*3/uL (ref 150–400)
RBC: 3.31 MIL/uL — ABNORMAL LOW (ref 4.22–5.81)
RDW: 16.1 % — AB (ref 11.5–15.5)
WBC: 9 10*3/uL (ref 4.0–10.5)

## 2014-12-20 LAB — GLUCOSE, CAPILLARY
GLUCOSE-CAPILLARY: 112 mg/dL — AB (ref 65–99)
GLUCOSE-CAPILLARY: 118 mg/dL — AB (ref 65–99)
GLUCOSE-CAPILLARY: 119 mg/dL — AB (ref 65–99)
Glucose-Capillary: 111 mg/dL — ABNORMAL HIGH (ref 65–99)
Glucose-Capillary: 106 mg/dL — ABNORMAL HIGH (ref 65–99)

## 2014-12-20 LAB — COMPREHENSIVE METABOLIC PANEL
ALT: 69 U/L — ABNORMAL HIGH (ref 17–63)
ANION GAP: 5 (ref 5–15)
AST: 73 U/L — AB (ref 15–41)
Albumin: 2.3 g/dL — ABNORMAL LOW (ref 3.5–5.0)
Alkaline Phosphatase: 59 U/L (ref 38–126)
BUN: 8 mg/dL (ref 6–20)
CALCIUM: 8.3 mg/dL — AB (ref 8.9–10.3)
CO2: 26 mmol/L (ref 22–32)
CREATININE: 0.72 mg/dL (ref 0.61–1.24)
Chloride: 102 mmol/L (ref 101–111)
GLUCOSE: 104 mg/dL — AB (ref 65–99)
Potassium: 3.4 mmol/L — ABNORMAL LOW (ref 3.5–5.1)
Sodium: 133 mmol/L — ABNORMAL LOW (ref 135–145)
Total Bilirubin: 1.7 mg/dL — ABNORMAL HIGH (ref 0.3–1.2)
Total Protein: 5.5 g/dL — ABNORMAL LOW (ref 6.5–8.1)

## 2014-12-20 LAB — HCV RNA QUANT
HCV QUANT LOG: 5.792 {Log_IU}/mL (ref >1.70)
HCV Quantitative: 619320 IU/mL (ref >50)

## 2014-12-20 LAB — MAGNESIUM: MAGNESIUM: 1.9 mg/dL (ref 1.7–2.4)

## 2014-12-20 LAB — PHOSPHORUS: Phosphorus: 3 mg/dL (ref 2.5–4.6)

## 2014-12-20 MED ORDER — DICLOFENAC SODIUM 1 % TD GEL
2.0000 g | Freq: Four times a day (QID) | TRANSDERMAL | Status: DC
Start: 2014-12-20 — End: 2014-12-26
  Administered 2014-12-20 – 2014-12-26 (×23): 2 g via TOPICAL
  Filled 2014-12-20: qty 100

## 2014-12-20 MED ORDER — FAT EMULSION 20 % IV EMUL
240.0000 mL | INTRAVENOUS | Status: AC
  Administered 2014-12-20: 240 mL via INTRAVENOUS
  Filled 2014-12-20: qty 250

## 2014-12-20 MED ORDER — POTASSIUM CHLORIDE 10 MEQ/100ML IV SOLN
10.0000 meq | INTRAVENOUS | Status: AC
  Administered 2014-12-20 (×4): 10 meq via INTRAVENOUS
  Filled 2014-12-20 (×4): qty 100

## 2014-12-20 MED ORDER — LIDOCAINE HCL (PF) 1 % IJ SOLN
5.0000 mL | Freq: Once | INTRAMUSCULAR | Status: AC
  Administered 2014-12-20: 5 mL via INTRADERMAL
  Filled 2014-12-20: qty 5

## 2014-12-20 MED ORDER — KCL-LACTATED RINGERS 20 MEQ/L IV SOLN
INTRAVENOUS | Status: DC
Start: 2014-12-20 — End: 2014-12-26
  Administered 2014-12-20 – 2014-12-25 (×3): via INTRAVENOUS
  Filled 2014-12-20 (×5): qty 1000

## 2014-12-20 MED ORDER — CLONIDINE HCL 0.1 MG/24HR TD PTWK
0.1000 mg | MEDICATED_PATCH | TRANSDERMAL | Status: DC
  Administered 2014-12-20: 0.1 mg via TRANSDERMAL
  Filled 2014-12-20: qty 1

## 2014-12-20 MED ORDER — M.V.I. ADULT IV INJ
INJECTION | INTRAVENOUS | Status: AC
  Administered 2014-12-20: 18:00:00 via INTRAVENOUS
  Filled 2014-12-20: qty 1680

## 2014-12-20 NOTE — Progress Notes (Signed)
Subjective: Patient is a patient of Dr. Nilsa Nuttinglin's with a history of primary osteoarthritis in the left knee. He reports issues with developing effusions in the past and reports that he started having increased swelling in the left knee last week. No new injury or change in activity. Reports increased pain with movement of the left knee and increased stiffness as a result of the swelling.   Objective: Vital signs in last 24 hours: Temp:  [98.5 F (36.9 C)-99.1 F (37.3 C)] 98.8 F (37.1 C) (07/17 1340) Pulse Rate:  [79-90] 79 (07/17 1340) Resp:  [18-20] 20 (07/17 1340) BP: (145-158)/(85-93) 145/90 mmHg (07/17 1340) SpO2:  [92 %-96 %] 96 % (07/17 1340)  Intake/Output from previous day: 07/16 0701 - 07/17 0700 In: 0  Out: 2850 [Urine:2850] Intake/Output this shift: Total I/O In: 0  Out: 1425 [Urine:1425]   Recent Labs  12/18/14 0540 12/19/14 0443 12/20/14 0430  HGB 10.0* 10.5* 10.1*    Recent Labs  12/19/14 0443 12/20/14 0430  WBC 9.5 9.0  RBC 3.58* 3.31*  HCT 31.9* 29.2*  PLT 168 210    Recent Labs  12/19/14 0443 12/20/14 0430  NA 134* 133*  K 3.9 3.4*  CL 102 102  CO2 27 26  BUN 8 8  CREATININE 0.63 0.72  GLUCOSE 117* 104*  CALCIUM 8.1* 8.3*   Neurologically intact Neurovascular intact Intact pulses distally Dorsiflexion/Plantar flexion intact No cellulitis present Compartment soft Pain with passive and active motion of the left knee. No erythema or lesions. Large effusion.  Assessment/Plan: Primary osteoarthritis, left knee He had a large effusion over the left knee requiring aspiration. After sterile prep, the left knee was injected with 5cc 1% lidocaine and subsequently 235cc of serous fluid was aspirated from the knee. No pus or other signs of infection. No labs or cultures needed from aspirate. Will rest and ice the knee today. Will have Dr. Charlann [name] or his PA check on patient tomorrow.    Alfred [name] Alfred [name] 12/20/2014, 2:56 PM

## 2014-12-20 NOTE — Progress Notes (Signed)
[facility] PROGRESS NOTE  Alfred [name]:[id number] DOB: Aug 03, 1950 DOA: 12/12/2014 PCP: No PCP Per Patient  Brief Summary  The patient is a 64 year old male with history of alcohol and tobacco abuse who presented with a 2 day history of upper abdominal pain, nausea, anorexia. He had severe aching right upper quadrant pain that became progressively worse and therefore he came to the hospital. CT scan demonstrated moderate to large amount of free air and free fluid within the abdomen and pelvis tracking along the paracolic gutters consistent with bowel perforation. He also had some soft tissue inflammation around the gallbladder and his liver function tests were elevated concerning for possible cholecystitis. He was found to have a moderate to large right inguinal hernia and also some focal wall thickening of the antrum of the stomach which appeared stable from 2015. He was admitted by general surgery and underwent exploratory laparoscopy. He was found to have a perforated duodenal ulcer and had laparoscopic omental patch of his perforated ulcer as well as core liver biopsies. His postoperative course has been complicated by DTs. His liver biopsy demonstrated early cirrhosis and hepatic steatosis consistent with alcohol abuse. Internal medicine has been consulted secondary to the finding of cirrhosis and the need for follow-up.  Assessment/Plan  Perforated duodenal ulcer status post laparoscopic omental patch on 12/13/2014 with persistent leak - H. Pylori negative  - Counseled EtOH and NSAID avoidance - Continue BID PPI at discharge - F/u with GI scheduled with Dr. 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 Charlann Boxer 09/2013) for aspiration and possible injection -  Diclofenac gel  Alcoholic cirrhosis with liver biopsy  performed on 12/13/2014. Does have mild thrombocytopenia but this was more likely related to acute illness since it is quickly resolving. coags are normal.  - Avoid tylenol and tylenol containing medications - Counseled EtOH cessation and added cirrhosis education to discharge paperwork  HCV Ab positive - Hep A and B neg, given 1st vaccine in series on 7/14 -  HCV Ab + >> RNA quant and genotype pending - LFTs trending down  Alcohol withdrawal, resolved.  Continue thiamine, folate.  SW to provide resources this coming week.  Delirium, likely secondary to EtOH w/d and acute illness and resolving  Elevated BP still elevated -  Schedule metoprolol  - Continue prn metoprolol -  Start clonidine patch 0.1mg    Hypokalemia, resolved with IV supplementation. Magnesium within normal limits  Hypoglycemia, resolved.  Normocytic anemia, hgb stable - Iron studies c/w possible iron deficiency although ferritin somewhat elevated (chronic disease), B12 1086, folate 7.1 - TSH 2.192 -  Given feraheme on 7/15 x 1  Mild thrombocytopenia, acute phase reactant and resolved  Mildly prolonged QTc - avoid QTc prolonging medications - magnesium wnl  Moderate protein calorie malnutrition - TPN   I have requested the patient be scheduled for community health and wellness clinic by the case manager. I will followup again tomorrow. Please contact me if I can be of assistance in the meanwhile. Thank you for this consultation.   HPI/Subjective:  More oriented today.  Denies pain, SOB, nausea.  Passing gas, no recent BM  Objective: Filed Vitals:   12/19/14 0556 12/19/14 1404 12/19/14 2134 12/20/14 0529  BP: 154/97 155/101 158/93 156/85  Pulse: 88 90 86 90  Temp: 98.1 F (36.7 C) 98.1 F (36.7  C) 99.1 F (37.3 C) 98.5 F (36.9 C)  TempSrc: Oral Oral Oral Oral  Resp: 20 20 18 18   Height:      Weight:      SpO2: 95% 93% 92% 93%    Intake/Output Summary (Last 24 hours) at 12/20/14  1317 Last data filed at 12/20/14 1222  Gross per 24 hour  Intake      0 ml  Output   3225 ml  Net  -3225 ml   Filed Weights   12/13/14 0215  Weight: 68.7 kg (151 lb 7.3 oz)   Body mass index is 19.44 kg/(m^2).  Exam:   General:  Adult male, No acute distress, smiling, making good eye contact and appropriately answering questions  HEENT:  NCAT, MMM  Cardiovascular:  RRR, nl S1, S2 no mrg, 2+ pulses, warm extremities  Respiratory:  CTAB, no increased WOB  Abdomen:   Hypoactive bowel sounds, soft, moderately distended, tender to palpation diffusely to deep palpation without rebound or guarding  MSK:   Normal tone and bulk, no LEE.  Large tense left knee effusion without erythema, warmth, induration  Neuro:  Grossly moves all extremities  Data Reviewed: Basic Metabolic Panel:  Recent Labs Lab 12/16/14 0403 12/17/14 0325 12/18/14 0540 12/19/14 0443 12/20/14 0430  NA 136 138 137 134* 133*  K 3.3* 3.7 3.5 3.9 3.4*  CL 102 106 107 102 102  CO2 23 27 28 27 26   GLUCOSE 69 98 129* 117* 104*  BUN 17 15 8 8 8   CREATININE 0.77 0.80 0.61 0.63 0.72  CALCIUM 8.1* 8.2* 7.7* 8.1* 8.3*  MG  --  2.0 1.6* 1.9 1.9  PHOS  --  3.0 1.8* 2.9 3.0   Liver Function Tests:  Recent Labs Lab 12/16/14 1435 12/17/14 0325 12/18/14 0540 12/19/14 0443 12/20/14 0430  AST 120* 105* 121* 95* 73*  ALT 106* 92* 89* 87* 69*  ALKPHOS 69 64 58 61 59  BILITOT 3.7* 2.5* 1.7* 1.6* 1.7*  PROT 6.4* 5.9* 5.2* 5.5* 5.5*  ALBUMIN 2.8* 2.5* 2.1* 2.3* 2.3*   No results for input(s): LIPASE, AMYLASE in the last 168 hours.  Recent Labs Lab 12/17/14 0804  AMMONIA 25   CBC:  Recent Labs Lab 12/16/14 0403 12/17/14 0325 12/18/14 0540 12/19/14 0443 12/20/14 0430  WBC 8.5 6.4 6.5 9.5 9.0  NEUTROABS  --   --  4.3  --   --   HGB 11.0* 10.9* 10.0* 10.5* 10.1*  HCT 32.6* 32.0* 29.3* 31.9* 29.2*  MCV 89.3 89.4 89.9 89.1 88.2  PLT 128* 150 152 168 210    Recent Results (from the past 240  hour(s))  MRSA PCR Screening     Status: None   Collection Time: 12/13/14  2:06 AM  Result Value Ref Range Status   MRSA by PCR NEGATIVE NEGATIVE Final    Comment:        The GeneXpert MRSA Assay (FDA approved for NASAL specimens only), is one component of a comprehensive MRSA colonization surveillance program. It is not intended to diagnose MRSA infection nor to guide or monitor treatment for MRSA infections.   Clostridium Difficile by PCR (not at [facility])     Status: None   Collection Time: 12/16/14  5:38 PM  Result Value Ref Range Status   C difficile by pcr NEGATIVE NEGATIVE Final     Studies: No results found.  Scheduled Meds: . antiseptic oral rinse  7 mL Mouth Rinse BID  . cloNIDine  0.1 mg Transdermal  Weekly  . heparin  5,000 Units Subcutaneous 3 times per day  . insulin aspart  0-9 Units Subcutaneous 4 times per day  . lip balm  1 application Topical BID  . metoprolol  5 mg Intravenous 4 times per day  . pantoprazole (PROTONIX) IV  40 mg Intravenous Q12H  . potassium chloride  10 mEq Intravenous Q1 Hr x 4  . sodium chloride  10-40 mL Intracatheter Q12H  . thiamine IV  100 mg Intravenous Daily  . thiamine  100 mg Oral Daily   Continuous Infusions: . dextrose 5% lactated ringers with KCl 20 mEq/L 70 mL/hr at 12/19/14 1800  . Marland Kitchen.TPN (CLINIMIX-E) Adult 55 mL/hr at 12/19/14 1808   And  . fat emulsion 240 mL (12/19/14 1808)  . Marland Kitchen.TPN (CLINIMIX-E) Adult     And  . fat emulsion    . lactated ringers with KCl 20 mEq/L      Principal Problem:   Perforated duodenal bulb ulcer s/p lap omental patch 12/13/2014 Active Problems:   Protein-calorie malnutrition, moderate   Alcohol abuse   Cirrhosis, alcoholic   Tobacco abuse   Colonoscopy refused   Inguinal hernia, right   Family history of colon cancer (father)    Time spent: 30 min    Ourania Hamler, [facility]ACKENZIE  [facility] Pager [phone]. If 7PM-7AM, please contact night-coverage at [email or web address], password  [email or web address] 12/20/2014, 1:17 PM  LOS: 7 days

## 2014-12-20 NOTE — Progress Notes (Signed)
Patient ID: Alfred [name], male   DOB: [birth year], 64 y.o.   MRN: [id number] Patient ID: Alfred [name], male   DOB: [birth year], 64 y.o.   MRN: [id number] 8 Days Post-Op  Subjective: Alfred [name] denies abdominal pain or nausea. States Alfred [name] has had some flatus and bowel movements. More alert today  Objective: Vital signs in last 24 hours: Temp:  [98.1 F (36.7 C)-99.1 F (37.3 C)] 98.5 F (36.9 C) (07/17 0529) Pulse Rate:  [86-90] 90 (07/17 0529) Resp:  [18-20] 18 (07/17 0529) BP: (155-158)/(85-101) 156/85 mmHg (07/17 0529) SpO2:  [92 %-93 %] 93 % (07/17 0529) Last BM Date: 12/18/14 (per pt)  Intake/Output from previous day: 07/16 0701 - 07/17 0700 In: 0  Out: 2850 [Urine:2850] Intake/Output this shift:    General appearance: drowsy and somewhat chronically ill-appearing but responds well to questions Resp: some upper airway congestion GI: mild distention. Soft without apparent tenderness. Incision/Wound: clean and dry.  Lab Results:   Recent Labs  12/19/14 0443 12/20/14 0430  WBC 9.5 9.0  HGB 10.5* 10.1*  HCT 31.9* 29.2*  PLT 168 210   BMET  Recent Labs  12/19/14 0443 12/20/14 0430  NA 134* 133*  K 3.9 3.4*  CL 102 102  CO2 27 26  GLUCOSE 117* 104*  BUN 8 8  CREATININE 0.63 0.72  CALCIUM 8.1* 8.3*     Studies/Results: No results found.  Anti-infectives: Anti-infectives    Start     Dose/Rate Route Frequency Ordered Stop   12/13/14 1000  piperacillin-tazobactam (ZOSYN) IVPB 3.375 g     3.375 g 12.5 mL/hr over 240 Minutes Intravenous Every 8 hours 12/13/14 0940 12/20/14 0604   12/12/14 2245  clindamycin (CLEOCIN) 900 mg, gentamicin (GARAMYCIN) 240 mg in sodium chloride 0.9 % 1,000 mL for intraperitoneal lavage  Status:  Discontinued    Comments:  Pharmacy may adjust dosing strength, schedule, rate of infusion, etc as needed to optimize therapy    Intraperitoneal To Surgery 12/12/14 2238 12/13/14 0205   12/12/14 2030  piperacillin-tazobactam (ZOSYN) IVPB 3.375 g      3.375 g 100 mL/hr over 30 Minutes Intravenous  Once 12/12/14 2020 12/12/14 2140      Assessment/Plan: POD#7 laparoscopic omental patch of perforated duodenal bulb ulcer and core liver biopsy---Dr. Michaell CowingGross  -UGI showed small contained leak 7/13. Keep NPO. we will plan to repeat study Monday, July 18  -Protonix 80mg  BID, then 40mg  BID when tolerating PO x8 weeks -will need EGD in 6-8 weeks to ensure ulcer has healed v. Biopsy. Also needs a screening colonoscopy at this time. -pain control, mobilize, pulmonary toilet -Protonix 40mg  IV ID- Completed abx todayH pylori negative.  Cirrhosis, Alcohol and HCV-biopsy shows steatohepatitis and early cirrhosis on biopsy. HCV RNA pending. Appreciate medicine management.  Delirium-continue CIWA protocol. Appears more alert.  VTE prophylaxis-SCD/heparin FEN-no NGT at this time. NPO for leak. PCM-severe, prealbumin 4.4 yesterday. On TPN Dispo-continue inpatient. PT/OT     LOS: 7 days    Saina Waage T 12/20/2014

## 2014-12-20 NOTE — Progress Notes (Signed)
PARENTERAL NUTRITION CONSULT NOTE  Pharmacy Consult for TPN Indication: perforated duodenal ulcer  Allergies  Allergen Reactions  . Nsaids Other (See Comments)    Perforated ulcer    Patient Measurements: Height: 6\' 2"  (188 cm) Weight: 151 lb 7.3 oz (68.7 kg) IBW/kg (Calculated) : 82.2 . Vital Signs: Temp: 98.5 F (36.9 C) (07/17 0529) Temp Source: Oral (07/17 0529) BP: 156/85 mmHg (07/17 0529) Pulse Rate: 90 (07/17 0529) Intake/Output from previous day: 07/16 0701 - 07/17 0700 In: 0  Out: 2850 [Urine:2850] Intake/Output from this shift:    Labs:  Recent Labs  12/18/14 0540 12/19/14 0443 12/20/14 0430  WBC 6.5 9.5 9.0  HGB 10.0* 10.5* 10.1*  HCT 29.3* 31.9* 29.2*  PLT 152 168 210     Recent Labs  12/18/14 0540 12/19/14 0443 12/20/14 0430  NA 137 134* 133*  K 3.5 3.9 3.4*  CL 107 102 102  CO2 28 27 26   GLUCOSE 129* 117* 104*  BUN 8 8 8   CREATININE 0.61 0.63 0.72  CALCIUM 7.7* 8.1* 8.3*  MG 1.6* 1.9 1.9  PHOS 1.8* 2.9 3.0  PROT 5.2* 5.5* 5.5*  ALBUMIN 2.1* 2.3* 2.3*  AST 121* 95* 73*  ALT 89* 87* 69*  ALKPHOS 58 61 59  BILITOT 1.7* 1.6* 1.7*  TRIG 132  --   --   Corrected calcium 9.2 Estimated Creatinine Clearance: 93 mL/min (by C-G formula based on Cr of 0.72).    Recent Labs  12/19/14 1803 12/20/14 0016 12/20/14 0651  GLUCAP 105* 119* 111*    Medical History: Past Medical History  Diagnosis Date  . Delirium tremens 09/10/2013  . Dental abscess 09/10/2013  . Knee effusion, left 09/10/2013  . Mental status change 09/10/2013    Insulin Requirements: 0 unit Novolog SSI used in past 24 hours  Current Nutrition:  NPO Clinimix-E 5/15 at 55 mL/hr Fat Emulsion 20% at 10 mL/hr  IVF: D5 LR + 20meq KCl at 70 mL/hr  Central access: PICC placed 7/14 TPN start date: 7/14  ASSESSMENT                                                                                                          HPI: [id number] [id number] presented 7/9 with abdominal pain, underwent  lap exploration 7/10 for free air and found to have perforated doudenal bulb ulcer s/p patch repair.  He has alcohol cirrhosis with elevated LFTs.  Baseline malnutrition noted.  Orders to start TPN as patient has been NPO since surgery.   Significant events:  7/10 Surgical repair of perforated DU 7/14 started TPN  Today:    Glucose - CBGs < 150; anticipate SSI requirements may be low in setting of cirrhosis  Electrolytes - Na = 133; K = 3.4  Renal - SCr and BUN WNL  I/O - incomplete  LFTs - AST elevated to ~ 3x ULN.  AST, ALT and TBili elevated but improving.  TGs - 132 (7/15)  Prealbumin - baseline 4.4  NUTRITIONAL GOALS  RD recs: 1610-9604 Kcals/d, 75-85 grams protein/d Clinimix-E 5/15 at a goal rate of 65ml/hr + 20% fat emulsion at 83ml/hr will provide 84g/day protein, 1672Kcal/day.  PLAN                                                                                                                         1. At 1800 today:  Advance Clinimix-E 5/15 to goal of 70 ml/hr (using slow, cautious rate advancement due to risk of ongoing refeeding syndrome)  Continue 20% fat emulsion at 68ml/hr.  Reduce IVF to 55 mL/hr and remove D5W  Due to risk for hypoglycemia, dextrose was not removed from MIVF when TPN started/titrated to goal.   TPN to contain standard multivitamins and trace elements.  Tbili elevated  with possible slight scleral icterus, but bilirubin now steadily improving; no need to reduce or eliminate trace elements at this point.  2. TPN labs on Mondays and Thursdays.     Note patient currently has orders (per MD)  for daily CMet, Mg, and CBC (until 8/12) - will need to enter TPN labs if these orders are changed. 3. Replace with KCl x 4 runs 4. F/u daily.  Juliette Alcide, PharmD, BCPS.   Pager: [phone] 12/20/2014  8:07 AM

## 2014-12-21 ENCOUNTER — Inpatient Hospital Stay (HOSPITAL_COMMUNITY): Payer: Medicaid Other

## 2014-12-21 LAB — COMPREHENSIVE METABOLIC PANEL
ALT: 75 U/L — ABNORMAL HIGH (ref 17–63)
ANION GAP: 7 (ref 5–15)
AST: 107 U/L — ABNORMAL HIGH (ref 15–41)
Albumin: 2.5 g/dL — ABNORMAL LOW (ref 3.5–5.0)
Alkaline Phosphatase: 69 U/L (ref 38–126)
BUN: 9 mg/dL (ref 6–20)
CHLORIDE: 102 mmol/L (ref 101–111)
CO2: 23 mmol/L (ref 22–32)
Calcium: 8.6 mg/dL — ABNORMAL LOW (ref 8.9–10.3)
Creatinine, Ser: 0.67 mg/dL (ref 0.61–1.24)
GFR calc Af Amer: >60 mL/min (ref >60)
GFR calc non Af Amer: >60 mL/min (ref >60)
Glucose, Bld: 133 mg/dL — ABNORMAL HIGH (ref 65–99)
Potassium: 3.7 mmol/L (ref 3.5–5.1)
SODIUM: 132 mmol/L — AB (ref 135–145)
Total Bilirubin: 1.6 mg/dL — ABNORMAL HIGH (ref 0.3–1.2)
Total Protein: 6.3 g/dL — ABNORMAL LOW (ref 6.5–8.1)

## 2014-12-21 LAB — CBC
HCT: 32 % — ABNORMAL LOW (ref 39.0–52.0)
HEMOGLOBIN: 11 g/dL — AB (ref 13.0–17.0)
MCH: 30.4 pg (ref 26.0–34.0)
MCHC: 34.4 g/dL (ref 30.0–36.0)
MCV: 88.4 fL (ref 78.0–100.0)
Platelets: 302 10*3/uL (ref 150–400)
RBC: 3.62 MIL/uL — AB (ref 4.22–5.81)
RDW: 16.4 % — AB (ref 11.5–15.5)
WBC: 11 10*3/uL — AB (ref 4.0–10.5)

## 2014-12-21 LAB — DIFFERENTIAL
Basophils Absolute: 0 10*3/uL (ref 0.0–0.1)
Basophils Relative: 0 % (ref 0–1)
EOS ABS: 0.1 10*3/uL (ref 0.0–0.7)
EOS PCT: 1 % (ref 0–5)
LYMPHS ABS: 1.5 10*3/uL (ref 0.7–4.0)
Lymphocytes Relative: 14 % (ref 12–46)
MONOS PCT: 11 % (ref 3–12)
Monocytes Absolute: 1.2 10*3/uL — ABNORMAL HIGH (ref 0.1–1.0)
Neutro Abs: 8.2 10*3/uL — ABNORMAL HIGH (ref 1.7–7.7)
Neutrophils Relative %: 74 % (ref 43–77)

## 2014-12-21 LAB — URINALYSIS, ROUTINE W REFLEX MICROSCOPIC
BILIRUBIN URINE: NEGATIVE
Glucose, UA: NEGATIVE mg/dL
HGB URINE DIPSTICK: NEGATIVE
Ketones, ur: NEGATIVE mg/dL
LEUKOCYTES UA: NEGATIVE
Nitrite: NEGATIVE
PROTEIN: NEGATIVE mg/dL
Specific Gravity, Urine: 1.013 (ref 1.005–1.030)
Urobilinogen, UA: 0.2 mg/dL (ref 0.0–1.0)
pH: 8 (ref 5.0–8.0)

## 2014-12-21 LAB — HEPATITIS C GENOTYPE

## 2014-12-21 LAB — PREALBUMIN: PREALBUMIN: 3.7 mg/dL — AB (ref 18–38)

## 2014-12-21 LAB — GLUCOSE, CAPILLARY
GLUCOSE-CAPILLARY: 115 mg/dL — AB (ref 65–99)
Glucose-Capillary: 115 mg/dL — ABNORMAL HIGH (ref 65–99)
Glucose-Capillary: 94 mg/dL (ref 65–99)

## 2014-12-21 LAB — MAGNESIUM: Magnesium: 1.7 mg/dL (ref 1.7–2.4)

## 2014-12-21 LAB — PHOSPHORUS: PHOSPHORUS: 3.4 mg/dL (ref 2.5–4.6)

## 2014-12-21 LAB — TRIGLYCERIDES: Triglycerides: 128 mg/dL (ref <150)

## 2014-12-21 MED ORDER — FAT EMULSION 20 % IV EMUL
240.0000 mL | INTRAVENOUS | Status: AC
  Administered 2014-12-21: 240 mL via INTRAVENOUS
  Filled 2014-12-21: qty 250

## 2014-12-21 MED ORDER — IOHEXOL 300 MG/ML  SOLN
300.0000 mL | Freq: Once | INTRAMUSCULAR | Status: AC | PRN
  Administered 2014-12-21: 300 mL via ORAL

## 2014-12-21 MED ORDER — TRACE MINERALS CR-CU-MN-SE-ZN 10-1000-500-60 MCG/ML IV SOLN
INTRAVENOUS | Status: AC
  Administered 2014-12-21: 18:00:00 via INTRAVENOUS
  Filled 2014-12-21: qty 1680

## 2014-12-21 NOTE — Progress Notes (Signed)
PARENTERAL NUTRITION CONSULT NOTE - Follow up  Pharmacy Consult for TPN Indication: perforated duodenal ulcer  Allergies  Allergen Reactions  . Nsaids Other (See Comments)    Perforated ulcer    Patient Measurements: Height: 6\' 2"  (188 cm) Weight: 151 lb 7.3 oz (68.7 kg) IBW/kg (Calculated) : 82.2 . Vital Signs: Temp: 98.6 F (37 C) (07/18 0601) Temp Source: Oral (07/18 0601) BP: 161/92 mmHg (07/18 0601) Pulse Rate: 96 (07/18 0601) Intake/Output from previous day: 07/17 0701 - 07/18 0700 In: 10 [I.V.:10] Out: 3225 [Urine:3225] Intake/Output from this shift:    Labs:  Recent Labs  12/19/14 0443 12/20/14 0430 12/21/14 0500  WBC 9.5 9.0 11.0*  HGB 10.5* 10.1* 11.0*  HCT 31.9* 29.2* 32.0*  PLT 168 210 302     Recent Labs  12/19/14 0443 12/20/14 0430 12/21/14 0500  NA 134* 133* 132*  K 3.9 3.4* 3.7  CL 102 102 102  CO2 27 26 23   GLUCOSE 117* 104* 133*  BUN 8 8 9   CREATININE 0.63 0.72 0.67  CALCIUM 8.1* 8.3* 8.6*  MG 1.9 1.9 1.7  PHOS 2.9 3.0 3.4  PROT 5.5* 5.5* 6.3*  ALBUMIN 2.3* 2.3* 2.5*  AST 95* 73* 107*  ALT 87* 69* 75*  ALKPHOS 61 59 69  BILITOT 1.6* 1.7* 1.6*  PREALBUMIN  --   --  3.7*  TRIG  --   --  128  Corrected calcium 9.2 Estimated Creatinine Clearance: 93 mL/min (by C-G formula based on Cr of 0.67).    Recent Labs  12/20/14 1758 12/20/14 2354 12/21/14 0600  GLUCAP 106* 112* 115*    Medical History: Past Medical History  Diagnosis Date  . Delirium tremens 09/10/2013  . Dental abscess 09/10/2013  . Knee effusion, left 09/10/2013  . Mental status change 09/10/2013    Insulin Requirements: 0 unit SSI used in past 24 hours  Current Nutrition:  NPO Clinimix E 5/15 at 70 mL/hr Fat Emulsion 20% at 10 mL/hr  IVF: LR + 20meq KCl at 55 mL/hr  Central access: PICC placed 7/14 TPN start date: 7/14  ASSESSMENT                                                                                                          HPI: Alfred [name]  presented 7/9 with abdominal pain, underwent lap exploration 7/10 for free air and found to have perforated doudenal bulb ulcer s/p patch repair.  He has alcohol cirrhosis with elevated LFTs. Baseline malnutrition noted. Orders to start TPN as patient has been NPO since surgery.   Significant events:  7/10: Surgical repair of perforated DU 7/14: Started TPN 7/17: TPN advanced to goal  Today:   Glucose - CBGs at goal < 150; w/ no correction needed.  Electrolytes - K improved to wnl after 4 runs yesterday. Na remains low, other electrolytes wnl.  Renal - SCr and BUN WNL  LFTs - AST, ALT, and TBili were improving but a slight bump today.  TGs - 132, 128  Prealbumin - 4.4, 3.7  NUTRITIONAL GOALS  RD recs: 4098-1191 Kcals/d, 75-85 grams protein/d Clinimix E 5/15 at a goal rate of 64ml/hr + 20% fat emulsion at 27ml/hr will provide 84g/day protein, 1672Kcal/day.  PLAN                                                                                                                          Cont Clinimix E 5/15 at 70 ml/hr  Cont 20% fat emulsion at 12ml/hr  Cont IVF at 55 mL/hr or per MD   TPN to contain standard multivitamins and trace elements.  Tbili elevated  with possible slight scleral icterus, but bilirubin now steadily improving; no need to reduce or eliminate trace elements at this point.  2. TPN labs on Mondays and Thursdays.     Note patient currently has orders (per MD)  for daily CMet, Mg, and CBC (until 8/12) - will need to enter TPN labs if these orders are changed. 4. F/u daily.  Charolotte Eke, PharmD, pager [phone]. 12/21/2014,8:52 AM.

## 2014-12-21 NOTE — Progress Notes (Signed)
Patient's sister, Alfred [name], states that patient's abdomen before admission was not distended.

## 2014-12-21 NOTE — Progress Notes (Signed)
[facility] Progress Note  9 Days Post-Op  Subjective: Pt doing okay.  Denies N/V or abdominal pain.  Denies feeling bloated, but noticeably distended.  Says he's having flatus and had a BM this am per him (none recorded).  Was only able to stand with therapy yesterday.  Fall risk per RN and PT.  Had left knee drained yesterday by ortho.  Objective: Vital signs in last 24 hours: Temp:  [98.6 F (37 C)-99.3 F (37.4 C)] 98.6 F (37 C) (07/18 0601) Pulse Rate:  [79-96] 96 (07/18 0601) Resp:  [18-20] 20 (07/18 0601) BP: (145-161)/(85-92) 161/92 mmHg (07/18 0601) SpO2:  [91 %-96 %] 91 % (07/18 0601) Last BM Date: 12/18/14 (per pt)  Intake/Output from previous day: 07/17 0701 - 07/18 0700 In: 10 [I.V.:10] Out: 3225 [Urine:3225] Intake/Output this shift:    PE: Gen:  Alert, NAD, pleasant Abd: Soft, greatly distended, mild tenderness, +BS, no HSM, incisions C/D/I   Lab Results:   Recent Labs  12/20/14 0430 12/21/14 0500  WBC 9.0 11.0*  HGB 10.1* 11.0*  HCT 29.2* 32.0*  PLT 210 302   BMET  Recent Labs  12/20/14 0430 12/21/14 0500  NA 133* 132*  K 3.4* 3.7  CL 102 102  CO2 26 23  GLUCOSE 104* 133*  BUN 8 9  CREATININE 0.72 0.67  CALCIUM 8.3* 8.6*   PT/INR No results for input(s): LABPROT, INR in the last 72 hours. CMP     Component Value Date/Time   NA 132* 12/21/2014 0500   K 3.7 12/21/2014 0500   CL 102 12/21/2014 0500   CO2 23 12/21/2014 0500   GLUCOSE 133* 12/21/2014 0500   BUN 9 12/21/2014 0500   CREATININE 0.67 12/21/2014 0500   CALCIUM 8.6* 12/21/2014 0500   PROT 6.3* 12/21/2014 0500   ALBUMIN 2.5* 12/21/2014 0500   AST 107* 12/21/2014 0500   ALT 75* 12/21/2014 0500   ALKPHOS 69 12/21/2014 0500   BILITOT 1.6* 12/21/2014 0500   GFRNONAA >60 12/21/2014 0500   GFRAA >60 12/21/2014 0500   Lipase     Component Value Date/Time   LIPASE 50 12/13/2014 0555       Studies/Results: Dg Chest Port 1 View  12/21/2014   CLINICAL  DATA:  Leukocytosis.  EXAM: PORTABLE CHEST - 1 VIEW  COMPARISON:  December 15, 2014.  FINDINGS: The heart size and mediastinal contours are within normal limits. No pneumothorax is noted. Interval placement of right-sided PICC line is noted with distal tip overlying expected position of SVC. Increased interstitial densities are noted throughout both lungs most consistent with pulmonary edema or less likely atypical infection. No significant pleural effusion is noted. The visualized skeletal structures are unremarkable.  IMPRESSION: Increased bilateral interstitial densities are noted throughout both lungs most consistent with pulmonary edema or less likely interstitial pneumonitis. Interval placement of right-sided PICC line with distal tip overlying expected position of the SVC.   Electronically Signed   By: Lupita RaiderJames  Green Jr, M.D.   On: 12/21/2014 08:09    Anti-infectives: Anti-infectives    Start     Dose/Rate Route Frequency Ordered Stop   12/13/14 1000  piperacillin-tazobactam (ZOSYN) IVPB 3.375 g     3.375 g 12.5 mL/hr over 240 Minutes Intravenous Every 8 hours 12/13/14 0940 12/20/14 0604   12/12/14 2245  clindamycin (CLEOCIN) 900 mg, gentamicin (GARAMYCIN) 240 mg in sodium chloride 0.9 % 1,000 mL for intraperitoneal lavage  Status:  Discontinued    Comments:  Pharmacy may adjust dosing  strength, schedule, rate of infusion, etc as needed to optimize therapy    Intraperitoneal To Surgery 12/12/14 2238 12/13/14 0205   12/12/14 2030  piperacillin-tazobactam (ZOSYN) IVPB 3.375 g     3.375 g 100 mL/hr over 30 Minutes Intravenous  Once 12/12/14 2020 12/12/14 2140       Assessment/Plan Perforated duodenal bulb ulcer  POD#8 laparoscopic omental patch and core liver biopsy---Dr. Michaell Cowing  -UGI showed small contained leak 7/13. Keep NPO. Repeat study today -Protonix  BID, then  BID when tolerating PO x8 weeks -Will need EGD in 6-8 weeks to ensure ulcer has healed v. Biopsy. Also needs a  screening colonoscopy at this time. -Pain control, needs to mobilize, PT following, pulmonary toilet -Protonix  IV ID - Leukocytosis today 11.0.  CXR with pulm edema, UA pending.  Completed abx todayH pylori negative.  Cirrhosis, Alcohol and HCV - biopsy shows steatohepatitis and early cirrhosis on biopsy. HCV RNA [id number]. Appreciate medicine management.  Delirium-continue CIWA protocol. Appears more alert.  VTE prophylaxis-SCD/heparin FEN - No NGT at this time. NPO for leak. PCM - severe, prealbumin 3.7 today. Albumin 2.5.  On TPN Dispo - Continue inpatient. PT/OT.  Await UGI results.  I am concerned about how distended he is, but maybe this is his baseline.  Will discuss with team.  Consider CT scan if still has a leak and WBC continues to rise.    LOS: 8 days    Nonie Hoyer 12/21/2014, 8:40 AM Pager: [phone]

## 2014-12-21 NOTE — Progress Notes (Signed)
[facility] PROGRESS NOTE  Alfred AlfWilliam S [name] ZOX:[id number] DOB: 05-Feb-1951 DOA: 12/12/2014 PCP: No PCP Per Patient  Brief Summary  The patient is a 64 year old male with history of alcohol and tobacco abuse who presented with a 2 day history of upper abdominal pain, nausea, anorexia. He had severe aching right upper quadrant pain that became progressively worse and therefore he came to the hospital. CT scan demonstrated moderate to large amount of free air and free fluid within the abdomen and pelvis tracking along the paracolic gutters consistent with bowel perforation. He also had some soft tissue inflammation around the gallbladder and his liver function tests were elevated concerning for possible cholecystitis. He was found to have a moderate to large right inguinal hernia and also some focal wall thickening of the antrum of the stomach which appeared stable from 2015. He was admitted by general surgery and underwent exploratory laparoscopy. He was found to have a perforated duodenal ulcer and had laparoscopic omental patch of his perforated ulcer as well as core liver biopsies. His postoperative course has been complicated by DTs. His liver biopsy demonstrated early cirrhosis and hepatic steatosis consistent with alcohol abuse. Internal medicine has been consulted secondary to the finding of cirrhosis and the need for follow-up.  7/17:  Left knee aspiration by orthopedic surgery 7/18:  Upper GI series:  Adynamic ileus and no obvious leak but imperfect study  Assessment/Plan  Perforated duodenal ulcer status post laparoscopic omental patch on 12/13/2014 with persistent leak - H. Pylori negative  - Counseled EtOH and NSAID avoidance - Continue BID PPI at discharge - F/u with GI scheduled with Dr. 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 Charlann Boxerlin 09/2013) for aspiration and possible injection -  Diclofenac gel  Leukocytosis and temperature fluctuations without fever -  UA neg -  CXR with kerley B lines c/w interstitial edema, no obvious pneumonia >> d/c LR, but will hold off on lasix -  Repeat WBC in AM -  IS  Alcoholic cirrhosis with liver biopsy performed on 12/13/2014. Does have mild thrombocytopenia but this was more likely related to acute illness since it is quickly resolving. coags are normal.  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  Continue thiamine, folate.  SW to provide resources this coming week.  Delirium, likely secondary to EtOH w/d and acute illness and resolving  Elevated BP still elevated.   -  Schedule metoprolol  - Continue prn metoprolol -  Started clonidine patch 0.1mg  on 7/17, will not see full effects until 7/19 or 7/20  Hypokalemia, resolved with IV supplementation. Magnesium within normal limits  Hypoglycemia, resolved.  Normocytic anemia, hgb stable - Iron studies c/w possible iron deficiency although ferritin somewhat elevated (chronic disease), B12 1086, folate 7.1 - TSH 2.192 -  Given feraheme on 7/15 x 1  Mild thrombocytopenia, acute phase reactant and resolved  Mildly prolonged QTc - avoid QTc prolonging medications - magnesium wnl  Moderate protein calorie malnutrition - TPN   I have requested the patient be scheduled  for community health and wellness clinic by the case manager. I will followup again tomorrow. Please contact me if I can be of assistance in the meanwhile. Thank you for this consultation.   HPI/Subjective:  Denies pain, SOB, nausea,  cough, dysuria.  Passing gas and having BMs.    Objective: Filed Vitals:   12/20/14 1340 12/20/14 2141 12/21/14 0601 12/21/14 1446  BP: 145/90 155/85 161/92 147/95  Pulse: 79 82 96 71  Temp: 98.8 F (37.1 C) 99.3 F (37.4 C) 98.6 F (37 C) 98.3 F (36.8 C)  TempSrc: Oral Oral Oral Oral  Resp: Height:      Weight:      SpO2: 96% 95% 91% 98%    Intake/Output Summary (Last 24 hours) at 12/21/14 1450 Last data filed at 12/21/14 0602  Gross per 24 hour  Intake     10 ml  Output   1800 ml  Net  -1790 ml   Filed Weights   12/13/14 0215  Weight: 68.7 kg (151 lb 7.3 oz)   Body mass index is 19.44 kg/(m^2).  Exam:   General:  Adult male, No acute distress  HEENT:  NCAT, MMM  Cardiovascular:  RRR, nl S1, S2 no mrg, 2+ pulses, warm extremities  Respiratory:  CTAB, no increased WOB  Abdomen:   Hypoactive bowel sounds, soft, moderately distended,nontender  MSK:   Normal tone and bulk, no LEE.  Persistent but smaller left knee effusion without erythema, warmth, induration  Neuro:  Grossly moves all extremities  Data Reviewed: Basic Metabolic Panel:  Recent Labs Lab 12/17/14 0325 12/18/14 0540 12/19/14 0443 12/20/14 0430 12/21/14 0500  NA 138 137 134* 133* 132*  K 3.7 3.5 3.9 3.4* 3.7  CL 106 107 102 102 102  CO2 GLUCOSE 98 129* 117* 104* 133*  BUN CREATININE 0.80 0.61 0.63 0.72 0.67  CALCIUM 8.2* 7.7* 8.1* 8.3* 8.6*  MG 2.0 1.6* 1.9 1.9 1.7  PHOS 3.0 1.8* 2.9 3.0 3.4   Liver Function Tests:  Recent Labs Lab 12/17/14 0325 12/18/14 0540 12/19/14 0443 12/20/14 0430 12/21/14 0500  AST 105* 121* 95* 73* 107*  ALT 92* 89* 87* 69* 75*  ALKPHOS 64 58 61 59 69  BILITOT 2.5* 1.7* 1.6* 1.7* 1.6*  PROT 5.9* 5.2* 5.5* 5.5* 6.3*  ALBUMIN 2.5* 2.1* 2.3* 2.3* 2.5*   No results for input(s): LIPASE, AMYLASE in the last 168 hours.  Recent Labs Lab 12/17/14 0804  AMMONIA 25   CBC:  Recent Labs Lab 12/17/14 0325  12/18/14 0540 12/19/14 0443 12/20/14 0430 12/21/14 0500  WBC 6.4 6.5 9.5 9.0 11.0*  NEUTROABS  --  4.3  --   --  8.2*  HGB 10.9* 10.0* 10.5* 10.1* 11.0*  HCT 32.0* 29.3* 31.9* 29.2* 32.0*  MCV 89.4 89.9 89.1 88.2 88.4  PLT 150 152 168 210 302    Recent Results (from the past 240 hour(s))  MRSA PCR Screening     Status: None   Collection Time: 12/13/14  2:06 AM  Result Value Ref Range Status   MRSA by PCR NEGATIVE NEGATIVE Final    Comment:        The GeneXpert MRSA Assay (FDA approved for NASAL specimens only), is one component of a comprehensive MRSA colonization surveillance program. It is not intended to diagnose MRSA infection nor to guide or monitor treatment for MRSA infections.   Clostridium Difficile by PCR (not  at [facility])     Status: None   Collection Time: 12/16/14  5:38 PM  Result Value Ref Range Status   C difficile by pcr NEGATIVE NEGATIVE Final     Studies: Dg Chest Port 1 View  12/21/2014   CLINICAL DATA:  Leukocytosis.  EXAM: PORTABLE CHEST - 1 VIEW  COMPARISON:  December 15, 2014.  FINDINGS: The heart size and mediastinal contours are within normal limits. No pneumothorax is noted. Interval placement of right-sided PICC line is noted with distal tip overlying expected position of SVC. Increased interstitial densities are noted throughout both lungs most consistent with pulmonary edema or less likely atypical infection. No significant pleural effusion is noted. The visualized skeletal structures are unremarkable.  IMPRESSION: Increased bilateral interstitial densities are noted throughout both lungs most consistent with pulmonary edema or less likely interstitial pneumonitis. Interval placement of right-sided PICC line with distal tip overlying expected position of the SVC.   Electronically Signed   By: Lupita Raider, M.D.   On: 12/21/2014 08:09   Dg Ugi W/water Sol Cm  12/21/2014   CLINICAL DATA:  Status post repair of perforated duodenal ulcer. Contained leak  from duodenum identified on previous upper GI series.  EXAM: WATER SOLUBLE UPPER GI SERIES  TECHNIQUE: Single-column upper GI series was performed using water soluble contrast.  CONTRAST:  OMNIPAQUE IOHEXOL 300 MG/ML  SOLN  COMPARISON:  12/16/2014.  FLUOROSCOPY TIME:  Radiation Exposure Index (as provided by the fluoroscopic device):  If the device does not provide the exposure index:  Fluoroscopy Time (in minutes and seconds):  5 minutes and 7 seconds.  Number of Acquired Images:  FINDINGS: Pre-procedure KUB shows gaseous small bowel distention, likely related to ileus.  Patient was given water-soluble contrast material by mouth. Initial gastric emptying into the duodenum does not reveal contrast protruding into a small extraluminal pocket is seen on the previous study. There may be a tiny nipple like projection from the proximal duodenum, in the location of the collection previously, but this does not opacify an extraluminal pocket. This appearance may simply be related to mucosa or slight mucosal irregularity from previous surgery.  Patient was given additional water-soluble contrast material and put in multiple positions to try to promote better gastric emptying and re- stress the area of apparent leak on the previous study. However, after the initial episode of gastric emptying, there was very little contrast migration from the stomach into the duodenum. Under fluoroscopy, it was clear that very little duodenal or small bowel peristalsis was evident than that small bowel loops of the abdomen are gas-filled and distended, compatible with underlying ileus.  IMPRESSION: No evidence for contrast extravasation from the first portion of the duodenum on today's study. However, only 1 episode of gastric emptying was visualized on the current study and after the first bolus of contrast passed through the duodenum, very little contrast material migrated out of the stomach subsequently, despite nearly 11 minutes of  observation. While no contrast extravasation is visualized from the duodenum today, study is somewhat limited by the lack of gastric emptying and small bowel peristalsis, both compatible with underlying adynamic ileus.  I discussed these findings by telephone with Dr. Johna Sheriff at approximately 12:50 p.m. on 12/21/2014.   Electronically Signed   By: Kennith Center M.D.   On: 12/21/2014 13:16    Scheduled Meds: . antiseptic oral rinse  7 mL Mouth Rinse BID  . cloNIDine  0.1 mg Transdermal Weekly  . diclofenac sodium  2 g Topical QID  . heparin  5,000 Units Subcutaneous 3 times per day  . insulin aspart  0-9 Units Subcutaneous 4 times per day  . lip balm  1 application Topical BID  . metoprolol  5 mg Intravenous 4 times per day  . pantoprazole (PROTONIX) IV  40 mg Intravenous Q12H  . sodium chloride  10-40 mL Intracatheter Q12H  . thiamine IV  100 mg Intravenous Daily  . thiamine  100 mg Oral Daily   Continuous Infusions: . Marland KitchenTPN (CLINIMIX-E) Adult 70 mL/hr at 12/20/14 1743   And  . fat emulsion 240 mL (12/20/14 1744)  . Marland KitchenTPN (CLINIMIX-E) Adult     And  . fat emulsion    . lactated ringers with KCl 20 mEq/L 55 mL/hr at 12/21/14 1058    Principal Problem:   Perforated duodenal bulb ulcer s/p lap omental patch 12/13/2014 Active Problems:   Protein-calorie malnutrition, moderate   Alcohol abuse   Cirrhosis, alcoholic   Tobacco abuse   Colonoscopy refused   Inguinal hernia, right   Family history of colon cancer (father)   Effusion of left knee   Hepatitis C antibody test positive   Elevated blood pressure   Abnormal LFTs    Time spent: 30 min    Chrstopher Malenfant  [facility] Pager [phone]. If 7PM-7AM, please contact night-coverage at [email or web address], password [facility] 12/21/2014, 2:50 PM  LOS: 8 days

## 2014-12-21 NOTE — Progress Notes (Signed)
Physical Therapy Treatment Patient Details Name: Alfred [name] Scullion MRN: [id number] DOB: 02/08/1951 Today'[name] Date: 12/21/2014    History of Present Illness  Pt is a 64 year old male admitted 12/11/14 with abdominal pain, H/O ETOH, L knee effusion. Perforated duodenal bulb ulcer [name]/p lap omental patch 12/13/2014    PT Comments    Pt more alert and mobilizing better today although required some encouragement. Pt had L knee aspirated yesterday.   Follow Up Recommendations  SNF     Equipment Recommendations  Rolling walker with 5" wheels    Recommendations for Other Services       Precautions / Restrictions Precautions Precautions: Fall    Mobility  Bed Mobility Overal bed mobility: Needs Assistance Bed Mobility: Supine to Sit     Supine to sit: Min assist     General bed mobility comments: required some encouragement to participate  Transfers Overall transfer level: Needs assistance Equipment used: Rolling walker (2 wheeled) Transfers: Sit to/from Stand Sit to Stand: Min assist;+2 safety/equipment Stand pivot transfers: Min guard       General transfer comment: verbal cues for safe technique, assist to rise and steady, cues for extension  Ambulation/Gait Ambulation/Gait assistance: Min assist;+2 safety/equipment Ambulation Distance (Feet): 60 Feet Assistive device: Rolling walker (2 wheeled) Gait Pattern/deviations: Step-through pattern;Trunk flexed;Decreased stride length     General Gait Details: verbal cues for safe use of RW, distance limited by pt - reports fatigue   Stairs            Wheelchair Mobility    Modified Rankin (Stroke Patients Only)       Balance                                    Cognition Arousal/Alertness: Awake/alert Behavior During Therapy: WFL for tasks assessed/performed Overall Cognitive Status: Within Functional Limits for tasks assessed                      Exercises      General Comments         Pertinent Vitals/Pain Pain Assessment: Faces Faces Pain Scale: Hurts little more Pain Location: "everywhere" Pain Descriptors / Indicators: Grimacing Pain Intervention([name]): Limited activity within patient'[name] tolerance;Monitored during session;Repositioned    Home Living                      Prior Function            PT Goals (current goals can now be found in the care plan section) Progress towards PT goals: Progressing toward goals    Frequency  Min 3X/week    PT Plan Current plan remains appropriate    Co-evaluation             End of Session Equipment Utilized During Treatment: Gait belt Activity Tolerance: Patient limited by fatigue Patient left: in chair;with call bell/phone within reach;with chair alarm set     Time: [id number] PT Time Calculation (min) (ACUTE ONLY): 16 min  Charges:  $Gait Training: 8-22 mins                    G Codes:      Candas Deemer,KATHrine E 12/21/2014, 12:48 PM Zenovia JarredKati Marianna Cid, PT, DPT 12/21/2014 Pager: [phone]

## 2014-12-21 NOTE — Progress Notes (Signed)
Clinical Social Work  CSW met with patient at bedside to explain barriers to finding a SNF bed in [facility] due to [place] only and TPN. Patient reports understanding but states that he cannot return home because he lives with a friend that cannot provide much assistance. CSW spoke with patient about expanding search and patient agreeable. Patient prefers placement in [facility] if able because he has family that lives there.  CSW faxed out and will continue to follow.  [place], [place] [phone]

## 2014-12-22 ENCOUNTER — Inpatient Hospital Stay (HOSPITAL_COMMUNITY): Payer: Medicaid Other

## 2014-12-22 LAB — MAGNESIUM: Magnesium: 1.9 mg/dL (ref 1.7–2.4)

## 2014-12-22 LAB — COMPREHENSIVE METABOLIC PANEL
ALT: 93 U/L — ABNORMAL HIGH (ref 17–63)
ANION GAP: 5 (ref 5–15)
AST: 131 U/L — ABNORMAL HIGH (ref 15–41)
Albumin: 2.4 g/dL — ABNORMAL LOW (ref 3.5–5.0)
Alkaline Phosphatase: 67 U/L (ref 38–126)
BILIRUBIN TOTAL: 0.9 mg/dL (ref 0.3–1.2)
BUN: 12 mg/dL (ref 6–20)
CHLORIDE: 105 mmol/L (ref 101–111)
CO2: 22 mmol/L (ref 22–32)
Calcium: 8.8 mg/dL — ABNORMAL LOW (ref 8.9–10.3)
Creatinine, Ser: 0.59 mg/dL — ABNORMAL LOW (ref 0.61–1.24)
GFR calc Af Amer: >60 mL/min (ref >60)
GLUCOSE: 105 mg/dL — AB (ref 65–99)
POTASSIUM: 3.8 mmol/L (ref 3.5–5.1)
Sodium: 132 mmol/L — ABNORMAL LOW (ref 135–145)
Total Protein: 6.2 g/dL — ABNORMAL LOW (ref 6.5–8.1)

## 2014-12-22 LAB — CBC
HEMATOCRIT: 30 % — AB (ref 39.0–52.0)
Hemoglobin: 10.3 g/dL — ABNORMAL LOW (ref 13.0–17.0)
MCH: 30.2 pg (ref 26.0–34.0)
MCHC: 34.3 g/dL (ref 30.0–36.0)
MCV: 88 fL (ref 78.0–100.0)
PLATELETS: 289 10*3/uL (ref 150–400)
RBC: 3.41 MIL/uL — AB (ref 4.22–5.81)
RDW: 16.4 % — ABNORMAL HIGH (ref 11.5–15.5)
WBC: 9.3 10*3/uL (ref 4.0–10.5)

## 2014-12-22 LAB — GLUCOSE, CAPILLARY
Glucose-Capillary: 105 mg/dL — ABNORMAL HIGH (ref 65–99)
Glucose-Capillary: 116 mg/dL — ABNORMAL HIGH (ref 65–99)
Glucose-Capillary: 119 mg/dL — ABNORMAL HIGH (ref 65–99)
Glucose-Capillary: 124 mg/dL — ABNORMAL HIGH (ref 65–99)

## 2014-12-22 LAB — URINE CULTURE: Culture: NO GROWTH

## 2014-12-22 MED ORDER — FAT EMULSION 20 % IV EMUL
240.0000 mL | INTRAVENOUS | Status: AC
  Administered 2014-12-22: 240 mL via INTRAVENOUS
  Filled 2014-12-22: qty 250

## 2014-12-22 MED ORDER — CLONIDINE HCL 0.2 MG/24HR TD PTWK
0.2000 mg | MEDICATED_PATCH | TRANSDERMAL | Status: DC
  Filled 2014-12-22: qty 1

## 2014-12-22 MED ORDER — M.V.I. ADULT IV INJ
INJECTION | INTRAVENOUS | Status: AC
  Administered 2014-12-22: 18:00:00 via INTRAVENOUS
  Filled 2014-12-22: qty 1680

## 2014-12-22 NOTE — Progress Notes (Signed)
PARENTERAL NUTRITION CONSULT NOTE - Follow up  Pharmacy Consult for TPN Indication: perforated duodenal ulcer  Allergies  Allergen Reactions  . Nsaids Other (See Comments)    Perforated ulcer    Patient Measurements: Height: 6\' 2"  (188 cm) Weight: 151 lb 7.3 oz (68.7 kg) IBW/kg (Calculated) : 82.2 . Vital Signs: Temp: 99 F (37.2 C) (07/19 0602) Temp Source: Oral (07/19 0602) BP: 162/91 mmHg (07/19 0602) Pulse Rate: 76 (07/19 0602) Intake/Output from previous day: 07/18 0701 - 07/19 0700 In: 204.3 [I.V.:10; TPN:194.3] Out: -  Intake/Output from this shift:    Labs:  Recent Labs  12/20/14 0430 12/21/14 0500 12/22/14 0500  WBC 9.0 11.0* 9.3  HGB 10.1* 11.0* 10.3*  HCT 29.2* 32.0* 30.0*  PLT 210 302 289     Recent Labs  12/20/14 0430 12/21/14 0500 12/22/14 0500  NA 133* 132* 132*  K 3.4* 3.7 3.8  CL 102 102 105  CO2 26 23 22   GLUCOSE 104* 133* 105*  BUN 8 9 12   CREATININE 0.72 0.67 0.59*  CALCIUM 8.3* 8.6* 8.8*  MG 1.9 1.7 1.9  PHOS 3.0 3.4  --   PROT 5.5* 6.3* 6.2*  ALBUMIN 2.3* 2.5* 2.4*  AST 73* 107* 131*  ALT 69* 75* 93*  ALKPHOS 59 69 67  BILITOT 1.7* 1.6* 0.9  PREALBUMIN  --  3.7*  --   TRIG  --  128  --   Corrected calcium 10 Estimated Creatinine Clearance: 93 mL/min (by C-G formula based on Cr of 0.59).    Recent Labs  12/21/14 1806 12/22/14 0028 12/22/14 0624  GLUCAP 94 105* 119*    Medical History: Past Medical History  Diagnosis Date  . Delirium tremens 09/10/2013  . Dental abscess 09/10/2013  . Knee effusion, left 09/10/2013  . Mental status change 09/10/2013    Insulin Requirements: 0 unit SSI used in past 24 hours  Current Nutrition:  NPO Clinimix E 5/15 at 70 mL/hr Fat Emulsion 20% at 10 mL/hr  IVF: LR + 20meq KCl at 10 mL/hr  Central access: PICC placed 7/14 TPN start date: 7/14  ASSESSMENT                                                                                                          HPI: Alfred [name]  presented 7/9 with abdominal pain, underwent lap exploration 7/10 for free air and found to have perforated doudenal bulb ulcer s/p patch repair.  He has alcohol cirrhosis with elevated LFTs. Baseline malnutrition noted. Orders to start TPN as patient has been NPO since surgery.   Significant events:  7/10: Surgical repair of perforated DU 7/14: Started TPN 7/17: TPN advanced to goal  Today:   Glucose - CBGs at goal < 150; w/ no correction needed.  Electrolytes - K remains WNL. Na remains low but stable, other electrolytes wnl.  Renal - SCr and BUN WNL  LFTs - AST, ALT increased and TBili continues to improve  TGs - 132, 128  Prealbumin - 4.4, 3.7  NUTRITIONAL GOALS  RD recs: 1610-9604 Kcals/d, 75-85 grams protein/d Clinimix E 5/15 at a goal rate of 22ml/hr + 20% fat emulsion at 31ml/hr will provide 84g/day protein, 1672Kcal/day.  PLAN                                                                                                                          Cont Clinimix E 5/15 at 70 ml/hr  Cont 20% fat emulsion at 71ml/hr  Cont IVF at 10 mL/hr or per MD   TPN to contain standard multivitamins and trace elements.  Tbili now WNL   TPN labs on Mondays and Thursdays.    Note patient currently has orders (per MD) for daily CMet, Mg, and CBC (until 8/12) - will need to enter TPN labs if these orders are changed.  Continue to monitor LFTs closely as starting to rise   Hessie Knows, PharmD, BCPS Pager [phone] 12/22/2014 8:30 AM

## 2014-12-22 NOTE — Progress Notes (Signed)
[facility] PROGRESS NOTE  Alfred [name] ZOX:[id number] DOB: 09/15/1950 DOA: 12/12/2014 PCP: No PCP Per Patient  Brief Summary  The patient is a 64 year old male with history of alcohol and tobacco abuse who presented with a 2 day history of upper abdominal pain, nausea, anorexia. He had severe aching right upper quadrant pain that became progressively worse and therefore he came to the hospital. CT scan demonstrated moderate to large amount of free air and free fluid within the abdomen and pelvis tracking along the paracolic gutters consistent with bowel perforation. He also had some soft tissue inflammation around the gallbladder and his liver function tests were elevated concerning for possible cholecystitis. He was found to have a moderate to large right inguinal hernia and also some focal wall thickening of the antrum of the stomach which appeared stable from 2015. He was admitted by general surgery and underwent exploratory laparoscopy. He was found to have a perforated duodenal ulcer and had laparoscopic omental patch of his perforated ulcer as well as core liver biopsies. His postoperative course has been complicated by DTs. His liver biopsy demonstrated early cirrhosis and hepatic steatosis consistent with alcohol abuse. Internal medicine has been consulted secondary to the finding of cirrhosis and the need for follow-up.  7/17:  Left knee aspiration by orthopedic surgery 7/18:  Upper GI series:  Adynamic ileus and no obvious leak but imperfect study  Assessment/Plan  Perforated duodenal ulcer status post laparoscopic omental patch on 12/13/2014 with persistent leak - H. Pylori negative  - Counseled EtOH and NSAID avoidance - Continue BID PPI at discharge - F/u with GI scheduled with Dr. Randa Evens - General surgery managing -  No obvious leak on upper GI   Large left knee effusion due to loose body and arthritis, improved -  Orthopedics consult (last seen by Dr. Charlann Boxer 09/2013)  for aspiration and possible injection -  Continue Diclofenac gel  Leukocytosis and temperature fluctuations without fever, resolved -  UA neg -  CXR with kerley B lines c/w interstitial edema, no obvious pneumonia >> d/c LR, but will hold off on lasix -  IS  Alcoholic cirrhosis with liver biopsy performed on 12/13/2014.  coags and plt wnl.  - Avoid tylenol and tylenol containing medications - Counseled EtOH cessation and added cirrhosis education to discharge paperwork  HCV infection - Hep A and B neg, given 1st vaccine in series on 7/14 -  RNA quant [id number] (log 5.792) and genotype 1a - LFTs trending down  Alcohol withdrawal, resolved.  Continue thiamine, folate.  SW to provide resources this coming week.  Delirium, likely secondary to EtOH w/d and acute illness and resolved  Elevated BP still elevated.   -  Schedule metoprolol  - Continue prn metoprolol -  Increase clonidine patch to 0.2mg  on 7/18  Hypokalemia, resolved with IV supplementation. Magnesium within normal limits  Hypoglycemia, resolved.  Normocytic anemia, hgb stable - Iron studies c/w possible iron deficiency although ferritin somewhat elevated (chronic disease), B12 1086, folate 7.1 - TSH 2.192 -  Given feraheme on 7/15 x 1  Mild thrombocytopenia, acute phase reactant and resolved   Mildly prolonged QTc - avoid QTc prolonging medications - magnesium wnl  Moderate protein calorie malnutrition - TPN   I have requested the patient be scheduled for community health and wellness clinic by the case manager. I will followup again tomorrow. Please contact me if I can be of assistance in the meanwhile. Thank you for this consultation.   HPI/Subjective:  Denies pain,  SOB, nausea, cough, dysuria.  Passing gas and having BMs.  Patients states he is going to be allowed clears today.    Objective: Filed Vitals:   12/21/14 1446 12/21/14 2152 12/22/14 0602 12/22/14 1500  BP: 147/95 159/101 162/91  162/96  Pulse: 71 73 76 70  Temp: 98.3 F (36.8 C) 98.7 F (37.1 C) 99 F (37.2 C) 98 F (36.7 C)  TempSrc: Oral Oral Oral Oral  Resp: Height:      Weight:      SpO2: 98% 96% 100% 99%    Intake/Output Summary (Last 24 hours) at 12/22/14 1851 Last data filed at 12/22/14 1424  Gross per 24 hour  Intake 324.33 ml  Output    250 ml  Net  74.33 ml   Filed Weights   12/13/14 0215  Weight: 68.7 kg (151 lb 7.3 oz)   Body mass index is 19.44 kg/(m^2).  Exam:   General:  Adult male, No acute distress, smiling  HEENT:  NCAT, MMM  Cardiovascular:  RRR, nl S1, S2 no mrg, 2+ pulses, warm extremities  Respiratory:  CTAB, no increased WOB  Abdomen:   Normal bowel sounds, soft, moderately distended,nontender  MSK:   Normal tone and bulk, no LEE.  Persistent but smaller left knee effusion without erythema, warmth, induration  Neuro:  Grossly moves all extremities  Data Reviewed: Basic Metabolic Panel:  Recent Labs Lab 12/17/14 0325 12/18/14 0540 12/19/14 0443 12/20/14 0430 12/21/14 0500 12/22/14 0500  NA 138 137 134* 133* 132* 132*  K 3.7 3.5 3.9 3.4* 3.7 3.8  CL 106 107 102 102 102 105  CO2 GLUCOSE 98 129* 117* 104* 133* 105*  BUN CREATININE 0.80 0.61 0.63 0.72 0.67 0.59*  CALCIUM 8.2* 7.7* 8.1* 8.3* 8.6* 8.8*  MG 2.0 1.6* 1.9 1.9 1.7 1.9  PHOS 3.0 1.8* 2.9 3.0 3.4  --    Liver Function Tests:  Recent Labs Lab 12/18/14 0540 12/19/14 0443 12/20/14 0430 12/21/14 0500 12/22/14 0500  AST 121* 95* 73* 107* 131*  ALT 89* 87* 69* 75* 93*  ALKPHOS 58 61 59 69 67  BILITOT 1.7* 1.6* 1.7* 1.6* 0.9  PROT 5.2* 5.5* 5.5* 6.3* 6.2*  ALBUMIN 2.1* 2.3* 2.3* 2.5* 2.4*   No results for input(s): LIPASE, AMYLASE in the last 168 hours.  Recent Labs Lab 12/17/14 0804  AMMONIA 25   CBC:  Recent Labs Lab 12/18/14 0540 12/19/14 0443 12/20/14 0430 12/21/14 0500 12/22/14 0500  WBC 6.5 9.5 9.0 11.0* 9.3  NEUTROABS  4.3  --   --  8.2*  --   HGB 10.0* 10.5* 10.1* 11.0* 10.3*  HCT 29.3* 31.9* 29.2* 32.0* 30.0*  MCV 89.9 89.1 88.2 88.4 88.0  PLT 152 168 210 302 289    Recent Results (from the past 240 hour(s))  MRSA PCR Screening     Status: None   Collection Time: 12/13/14  2:06 AM  Result Value Ref Range Status   MRSA by PCR NEGATIVE NEGATIVE Final    Comment:        The GeneXpert MRSA Assay (FDA approved for NASAL specimens only), is one component of a comprehensive MRSA colonization surveillance program. It is not intended to diagnose MRSA infection nor to guide or monitor treatment for MRSA infections.   Clostridium Difficile by PCR (not at [facility])     Status: None   Collection Time: 12/16/14  5:38  PM  Result Value Ref Range Status   C difficile by pcr NEGATIVE NEGATIVE Final  Culture, Urine     Status: None   Collection Time: 12/21/14  8:29 AM  Result Value Ref Range Status   Specimen Description URINE, RANDOM  Final   Special Requests NONE  Final   Culture   Final    NO GROWTH 1 DAY Performed at [facility]    Report Status 12/22/2014 FINAL  Final     Studies: Dg Abd 1 View  12/22/2014   CLINICAL DATA: Persistent abdominal distention and pain  EXAM: ABDOMEN - 1 VIEW  COMPARISON:  12/21/2014  FINDINGS: Stable small-bowel distention is noted although contrast from the recent exam as passed from the stomach and into the colon and is predominantly on. No free air is seen. No mass lesion is noted.  IMPRESSION: Stable small bowel dilatation likely related to ileus. Contrast material as passed into the colon from the recent upper GI study.   Electronically Signed   By: Alcide Clever M.D.   On: 12/22/2014 08:37   Dg Chest Port 1 View  12/21/2014   CLINICAL DATA:  Leukocytosis.  EXAM: PORTABLE CHEST - 1 VIEW  COMPARISON:  December 15, 2014.  FINDINGS: The heart size and mediastinal contours are within normal limits. No pneumothorax is noted. Interval placement of right-sided PICC line is  noted with distal tip overlying expected position of SVC. Increased interstitial densities are noted throughout both lungs most consistent with pulmonary edema or less likely atypical infection. No significant pleural effusion is noted. The visualized skeletal structures are unremarkable.  IMPRESSION: Increased bilateral interstitial densities are noted throughout both lungs most consistent with pulmonary edema or less likely interstitial pneumonitis. Interval placement of right-sided PICC line with distal tip overlying expected position of the SVC.   Electronically Signed   By: Lupita Raider, M.D.   On: 12/21/2014 08:09   Dg Ugi W/water Sol Cm  12/21/2014   CLINICAL DATA:  Status post repair of perforated duodenal ulcer. Contained leak from duodenum identified on previous upper GI series.  EXAM: WATER SOLUBLE UPPER GI SERIES  TECHNIQUE: Single-column upper GI series was performed using water soluble contrast.  CONTRAST:  OMNIPAQUE IOHEXOL 300 MG/ML  SOLN  COMPARISON:  12/16/2014.  FLUOROSCOPY TIME:  Radiation Exposure Index (as provided by the fluoroscopic device):  If the device does not provide the exposure index:  Fluoroscopy Time (in minutes and seconds):  5 minutes and 7 seconds.  Number of Acquired Images:  FINDINGS: Pre-procedure KUB shows gaseous small bowel distention, likely related to ileus.  Patient was given water-soluble contrast material by mouth. Initial gastric emptying into the duodenum does not reveal contrast protruding into a small extraluminal pocket is seen on the previous study. There may be a tiny nipple like projection from the proximal duodenum, in the location of the collection previously, but this does not opacify an extraluminal pocket. This appearance may simply be related to mucosa or slight mucosal irregularity from previous surgery.  Patient was given additional water-soluble contrast material and put in multiple positions to try to promote better gastric emptying and  re- stress the area of apparent leak on the previous study. However, after the initial episode of gastric emptying, there was very little contrast migration from the stomach into the duodenum. Under fluoroscopy, it was clear that very little duodenal or small bowel peristalsis was evident than that small bowel loops of the abdomen are gas-filled and  distended, compatible with underlying ileus.  IMPRESSION: No evidence for contrast extravasation from the first portion of the duodenum on today's study. However, only 1 episode of gastric emptying was visualized on the current study and after the first bolus of contrast passed through the duodenum, very little contrast material migrated out of the stomach subsequently, despite nearly 11 minutes of observation. While no contrast extravasation is visualized from the duodenum today, study is somewhat limited by the lack of gastric emptying and small bowel peristalsis, both compatible with underlying adynamic ileus.  I discussed these findings by telephone with Dr. Johna SheriffHoxworth at approximately 12:50 p.m. on 12/21/2014.   Electronically Signed   By: Kennith CenterEric  Mansell M.D.   On: 12/21/2014 13:16    Scheduled Meds: . antiseptic oral rinse  7 mL Mouth Rinse BID  . [START ON 12/27/2014] cloNIDine  0.2 mg Transdermal Weekly  . diclofenac sodium  2 g Topical QID  . heparin  5,000 Units Subcutaneous 3 times per day  . insulin aspart  0-9 Units Subcutaneous 4 times per day  . lip balm  1 application Topical BID  . metoprolol  5 mg Intravenous 4 times per day  . pantoprazole (PROTONIX) IV  40 mg Intravenous Q12H  . sodium chloride  10-40 mL Intracatheter Q12H  . thiamine IV  100 mg Intravenous Daily  . thiamine  100 mg Oral Daily   Continuous Infusions: . Marland Kitchen.TPN (CLINIMIX-E) Adult 70 mL/hr at 12/22/14 1751   And  . fat emulsion 240 mL (12/22/14 1751)  . lactated ringers with KCl 20 mEq/L 10 mL/hr at 12/21/14 1924    Principal Problem:   Perforated duodenal bulb ulcer  s/p lap omental patch 12/13/2014 Active Problems:   Protein-calorie malnutrition, moderate   Alcohol abuse   Cirrhosis, alcoholic   Tobacco abuse   Colonoscopy refused   Inguinal hernia, right   Family history of colon cancer (father)   Effusion of left knee   Hepatitis C infection   Elevated blood pressure   Abnormal LFTs    Time spent: 30 min    Alfred [name]  [facility] Pager [phone]. If 7PM-7AM, please contact night-coverage at [email or web address], password [email or web address] 12/22/2014, 6:51 PM  LOS: 9 days

## 2014-12-22 NOTE — Progress Notes (Signed)
Clinical Social Work  Patient was discussed during Patent examinerQuality Control meeting and it was reported if patient has to DC on TNA then [facility]. CSW to keep director [facility](Hope Rife) updated for assistance when patient is stable for DC.  [place] Island[place]olly Suheily Birks, [place]CS[place] [phone]

## 2014-12-22 NOTE — Progress Notes (Signed)
Nutrition Follow-up  DOCUMENTATION CODES:   Non-severe (moderate) malnutrition in context of acute illness/injury  INTERVENTION:  - Continue CLD and diet advancement as medically feasible - Continue TPN/start TPN wean per pharmacy - RD will continue to monitor for needs  NUTRITION DIAGNOSIS:   Inadequate oral intake related to inability to eat as evidenced by NPO status. -slightly resolved with advancement to CLD this AM  GOAL:   Patient will meet greater than or equal to 90% of their needs -met with TPN  MONITOR:   Diet advancement, Labs, Weight trends, I & O's, Other (Comment) (TPN)  REASON FOR ASSESSMENT:   Consult New TPN/TNA  ASSESSMENT:   64 year old male with worsening 2 day history of upper abdominal pain. Decreased appetite. Nauseated but not during up. Drinks a fifth of liquor and at least a sixpack of beer a day. Smokes about a pack of cigarettes a day. Has been admitted in the past for alcohol toxicity and delirium tremens. Upper abdominal pain concerning. Ultrasound showed some mild gallbladder wall thickening. No evidence of cholecystitis. Liver function tests all elevated. Concern for cholecystitis. Surgical consultation requested.  7/19 - POD # 6 - Diet advanced to CLD at 0954 today; pt consumed 50% cup of jello and denies abdominal pain or nausea with intakes - TPN initiated 7/14 and continues per pharmacy: Clinimix E 5/15 @ 70 mL/hr with 20% lipids @ 10 mL/hr which is providing 1672 kcal, 84 grams protein which is meeting 97.5% minimal kcal and 100% protein needs - Plan per pharmacy today:  Cont Clinimix E 5/15 at 70 ml/hr  Cont 20% fat emulsion at 38m/hr  Cont IVF at 10 mL/hr or per MD  TPN to contain standard multivitamins and trace elements.  Tbili now WNL  TPN labs on Mondays and Thursdays. - Medications reviewed. Labs reviewed; CBGs: 94-119 mg/dL, Na: 132 mmol/L, creatinine low, Ca: 8.8 mg/dL, AST/ALT elevated  7/14 - Pt NPO and  reports abdominal pain with deep inhalations and feels if he were to consume something he would vomit - POD #1 ex lap with laparoscopic omental patch of perforated ulcer and core liver biopsies - PTA he had poor appetite x1 week  - States stable around UBW of 165 lbs - Weight hx review indicates pt has lost 14 lbs (8% body weight) in unknown time frame from UBW  Diet Order:  Diet - low sodium heart healthy TPN (CLINIMIX-E) Adult TPN (CLINIMIX-E) Adult Diet clear liquid Room service appropriate?: Yes; Fluid consistency:: Thin  Skin:  Wound (see comment) (abdominal incision)  Last BM:  7/14  Height:   Ht Readings from Last 1 Encounters:  12/13/14 '6\' 2"'  (1.88 m)    Weight:   Wt Readings from Last 1 Encounters:  12/13/14 151 lb 7.3 oz (68.7 kg)    Ideal Body Weight:  86.4 kg (kg)  Wt Readings from Last 10 Encounters:  12/13/14 151 lb 7.3 oz (68.7 kg)  09/07/13 148 lb (67.132 kg)    BMI:  Body mass index is 19.44 kg/(m^2).  Estimated Nutritional Needs:   Kcal:  [address] Protein:  75-85 grams  Fluid:  2.2 L/day  EDUCATION NEEDS:   No education needs identified at this time    JJarome Matin RD, LDN Inpatient Clinical Dietitian Pager # [phone] hours/weekend pager # [phone]

## 2014-12-22 NOTE — Progress Notes (Signed)
Date:  December 22, 2014 U.R. performed for needs and level of care. Will continue to follow for Case Management needs.  Ibrahem Volkman, RN, BSN, CCM   [phone] 

## 2014-12-22 NOTE — Progress Notes (Signed)
[facility] Progress Note  10 Days Post-Op  Subjective: Pt feels good.  No N/V, having lost of flatus and 8+BM's since UGI.  Ambulating some OOB.  Would like clears.    Objective: Vital signs in last 24 hours: Temp:  [98.3 F (36.8 C)-99 F (37.2 C)] 99 F (37.2 C) (07/19 0602) Pulse Rate:  [71-76] 76 (07/19 0602) Resp:  [20] 20 (07/19 0602) BP: (147-162)/(91-101) 162/91 mmHg (07/19 0602) SpO2:  [96 %-100 %] 100 % (07/19 0602) Last BM Date: 12/21/14  Intake/Output from previous day: 07/18 0701 - 07/19 0700 In: 204.3 [I.V.:10; TPN:194.3] Out: -  Intake/Output this shift:    PE: Gen:  Alert, NAD, pleasant Abd: Soft, NT, less distended today, +BS, no HSM, incisions C/D/I   Lab Results:   Recent Labs  12/21/14 0500 12/22/14 0500  WBC 11.0* 9.3  HGB 11.0* 10.3*  HCT 32.0* 30.0*  PLT 302 289   BMET  Recent Labs  12/21/14 0500 12/22/14 0500  NA 132* 132*  K 3.7 3.8  CL 102 105  CO2 23 22  GLUCOSE 133* 105*  BUN 9 12  CREATININE 0.67 0.59*  CALCIUM 8.6* 8.8*   PT/INR No results for input(s): LABPROT, INR in the last 72 hours. CMP     Component Value Date/Time   NA 132* 12/22/2014 0500   K 3.8 12/22/2014 0500   CL 105 12/22/2014 0500   CO2 22 12/22/2014 0500   GLUCOSE 105* 12/22/2014 0500   BUN 12 12/22/2014 0500   CREATININE 0.59* 12/22/2014 0500   CALCIUM 8.8* 12/22/2014 0500   PROT 6.2* 12/22/2014 0500   ALBUMIN 2.4* 12/22/2014 0500   AST 131* 12/22/2014 0500   ALT 93* 12/22/2014 0500   ALKPHOS 67 12/22/2014 0500   BILITOT 0.9 12/22/2014 0500   GFRNONAA >60 12/22/2014 0500   GFRAA >60 12/22/2014 0500   Lipase     Component Value Date/Time   LIPASE 50 12/13/2014 0555       Studies/Results: Dg Abd 1 View  12/22/2014   CLINICAL DATA: Persistent abdominal distention and pain  EXAM: ABDOMEN - 1 VIEW  COMPARISON:  12/21/2014  FINDINGS: Stable small-bowel distention is noted although contrast from the recent exam as passed from  the stomach and into the colon and is predominantly on. No free air is seen. No mass lesion is noted.  IMPRESSION: Stable small bowel dilatation likely related to ileus. Contrast material as passed into the colon from the recent upper GI study.   Electronically Signed   By: Alcide Clever M.D.   On: 12/22/2014 08:37   Dg Chest Port 1 View  12/21/2014   CLINICAL DATA:  Leukocytosis.  EXAM: PORTABLE CHEST - 1 VIEW  COMPARISON:  December 15, 2014.  FINDINGS: The heart size and mediastinal contours are within normal limits. No pneumothorax is noted. Interval placement of right-sided PICC line is noted with distal tip overlying expected position of SVC. Increased interstitial densities are noted throughout both lungs most consistent with pulmonary edema or less likely atypical infection. No significant pleural effusion is noted. The visualized skeletal structures are unremarkable.  IMPRESSION: Increased bilateral interstitial densities are noted throughout both lungs most consistent with pulmonary edema or less likely interstitial pneumonitis. Interval placement of right-sided PICC line with distal tip overlying expected position of the SVC.   Electronically Signed   By: Lupita Raider, M.D.   On: 12/21/2014 08:09   Dg Kayleen Memos W/water Sol Cm  12/21/2014   CLINICAL DATA:  Status post repair of perforated duodenal ulcer. Contained leak from duodenum identified on previous upper GI series.  EXAM: WATER SOLUBLE UPPER GI SERIES  TECHNIQUE: Single-column upper GI series was performed using water soluble contrast.  CONTRAST:  300mL OMNIPAQUE IOHEXOL 300 MG/ML  SOLN  COMPARISON:  12/16/2014.  FLUOROSCOPY TIME:  Radiation Exposure Index (as provided by the fluoroscopic device):  If the device does not provide the exposure index:  Fluoroscopy Time (in minutes and seconds):  5 minutes and 7 seconds.  Number of Acquired Images:  FINDINGS: Pre-procedure KUB shows gaseous small bowel distention, likely related to ileus.  Patient was  given water-soluble contrast material by mouth. Initial gastric emptying into the duodenum does not reveal contrast protruding into a small extraluminal pocket is seen on the previous study. There may be a tiny nipple like projection from the proximal duodenum, in the location of the collection previously, but this does not opacify an extraluminal pocket. This appearance may simply be related to mucosa or slight mucosal irregularity from previous surgery.  Patient was given additional water-soluble contrast material and put in multiple positions to try to promote better gastric emptying and re- stress the area of apparent leak on the previous study. However, after the initial episode of gastric emptying, there was very little contrast migration from the stomach into the duodenum. Under fluoroscopy, it was clear that very little duodenal or small bowel peristalsis was evident than that small bowel loops of the abdomen are gas-filled and distended, compatible with underlying ileus.  IMPRESSION: No evidence for contrast extravasation from the first portion of the duodenum on today's study. However, only 1 episode of gastric emptying was visualized on the current study and after the first bolus of contrast passed through the duodenum, very little contrast material migrated out of the stomach subsequently, despite nearly 11 minutes of observation. While no contrast extravasation is visualized from the duodenum today, study is somewhat limited by the lack of gastric emptying and small bowel peristalsis, both compatible with underlying adynamic ileus.  I discussed these findings by telephone with Dr. Johna SheriffHoxworth at approximately 12:50 p.m. on 12/21/2014.   Electronically Signed   By: Kennith CenterEric  Mansell M.D.   On: 12/21/2014 13:16    Anti-infectives: Anti-infectives    Start     Dose/Rate Route Frequency Ordered Stop   12/13/14 1000  piperacillin-tazobactam (ZOSYN) IVPB 3.375 g     3.375 g 12.5 mL/hr over 240 Minutes  Intravenous Every 8 hours 12/13/14 0940 12/20/14 0604   12/12/14 2245  clindamycin (CLEOCIN) 900 mg, gentamicin (GARAMYCIN) 240 mg in sodium chloride 0.9 % 1,000 mL for intraperitoneal lavage  Status:  Discontinued    Comments:  Pharmacy may adjust dosing strength, schedule, rate of infusion, etc as needed to optimize therapy    Intraperitoneal To Surgery 12/12/14 2238 12/13/14 0205   12/12/14 2030  piperacillin-tazobactam (ZOSYN) IVPB 3.375 g     3.375 g 100 mL/hr over 30 Minutes Intravenous  Once 12/12/14 2020 12/12/14 2140       Assessment/Plan Perforated duodenal bulb ulcer  POD #10 laparoscopic omental patch and core liver biopsy---Dr. Michaell CowingGross  -UGI showed small contained leak 7/13. Keep NPO. Repeat UGI 7/18 showed no leak, but noted delayed emptying/ileus -WBC improved, abdominal distension mildly improved and having flatus and BM's, start clears -Will need EGD in 6-8 weeks to ensure ulcer has healed v. Biopsy. Also needs a screening colonoscopy at this time. -Pain control, needs to mobilize, PT following, pulmonary toilet -Protonix 40mg  IV,  then  BID when tolerating PO x8 weeks  ID - Leukocytosis normalized today. CXR with pulm edema, UA pending. Completed abx today.H pylori negative.  Cirrhosis, Alcohol and HCV - biopsy shows steatohepatitis and early cirrhosis on biopsy. HCV RNA [id number]. Appreciate medicine management.  Delirium-continue CIWA protocol. Appears more alert.  VTE prophylaxis-SCD/heparin FEN - Clears PCM - prealbumin 3.7. Albumin 2.5. On TPN Dispo - Continue inpatient. PT/OT.     LOS: 9 days    Nonie Hoyer 12/22/2014, 9:13 AM Pager: [phone]

## 2014-12-23 ENCOUNTER — Inpatient Hospital Stay (HOSPITAL_COMMUNITY): Payer: Medicaid Other

## 2014-12-23 DIAGNOSIS — K265 Chronic or unspecified duodenal ulcer with perforation: Principal | ICD-10-CM

## 2014-12-23 DIAGNOSIS — R7989 Other specified abnormal findings of blood chemistry: Secondary | ICD-10-CM

## 2014-12-23 DIAGNOSIS — E440 Moderate protein-calorie malnutrition: Secondary | ICD-10-CM

## 2014-12-23 DIAGNOSIS — R030 Elevated blood-pressure reading, without diagnosis of hypertension: Secondary | ICD-10-CM

## 2014-12-23 DIAGNOSIS — F1010 Alcohol abuse, uncomplicated: Secondary | ICD-10-CM

## 2014-12-23 LAB — GLUCOSE, CAPILLARY
GLUCOSE-CAPILLARY: 109 mg/dL — AB (ref 65–99)
Glucose-Capillary: 120 mg/dL — ABNORMAL HIGH (ref 65–99)
Glucose-Capillary: 136 mg/dL — ABNORMAL HIGH (ref 65–99)
Glucose-Capillary: 95 mg/dL (ref 65–99)
Glucose-Capillary: 113 mg/dL — ABNORMAL HIGH (ref 65–99)

## 2014-12-23 LAB — COMPREHENSIVE METABOLIC PANEL
ALBUMIN: 2.3 g/dL — AB (ref 3.5–5.0)
ALT: 131 U/L — ABNORMAL HIGH (ref 17–63)
AST: 190 U/L — ABNORMAL HIGH (ref 15–41)
Alkaline Phosphatase: 79 U/L (ref 38–126)
Anion gap: 7 (ref 5–15)
BUN: 12 mg/dL (ref 6–20)
CALCIUM: 8.8 mg/dL — AB (ref 8.9–10.3)
CHLORIDE: 102 mmol/L (ref 101–111)
CO2: 22 mmol/L (ref 22–32)
Creatinine, Ser: 0.62 mg/dL (ref 0.61–1.24)
GFR calc non Af Amer: >60 mL/min (ref >60)
Glucose, Bld: 104 mg/dL — ABNORMAL HIGH (ref 65–99)
Potassium: 3.8 mmol/L (ref 3.5–5.1)
SODIUM: 131 mmol/L — AB (ref 135–145)
TOTAL PROTEIN: 6.1 g/dL — AB (ref 6.5–8.1)
Total Bilirubin: 1 mg/dL (ref 0.3–1.2)

## 2014-12-23 LAB — CBC
HEMATOCRIT: 31.3 % — AB (ref 39.0–52.0)
HEMOGLOBIN: 10.7 g/dL — AB (ref 13.0–17.0)
MCH: 30.2 pg (ref 26.0–34.0)
MCHC: 34.2 g/dL (ref 30.0–36.0)
MCV: 88.4 fL (ref 78.0–100.0)
PLATELETS: 320 10*3/uL (ref 150–400)
RBC: 3.54 MIL/uL — ABNORMAL LOW (ref 4.22–5.81)
RDW: 16.4 % — AB (ref 11.5–15.5)
WBC: 9.6 10*3/uL (ref 4.0–10.5)

## 2014-12-23 LAB — MAGNESIUM: Magnesium: 1.8 mg/dL (ref 1.7–2.4)

## 2014-12-23 MED ORDER — FAT EMULSION 20 % IV EMUL
120.0000 mL | INTRAVENOUS | Status: AC
Start: 2014-12-23 — End: 2014-12-24
  Administered 2014-12-23: 120 mL via INTRAVENOUS
  Filled 2014-12-23: qty 250

## 2014-12-23 MED ORDER — TRACE MINERALS CR-CU-MN-SE-ZN 10-1000-500-60 MCG/ML IV SOLN
INTRAVENOUS | Status: AC
  Administered 2014-12-23: 17:00:00 via INTRAVENOUS
  Filled 2014-12-23: qty 840

## 2014-12-23 MED ORDER — TRACE MINERALS CR-CU-MN-SE-ZN 10-1000-500-60 MCG/ML IV SOLN
INTRAVENOUS | Status: DC
  Filled 2014-12-23: qty 1680

## 2014-12-23 MED ORDER — FAT EMULSION 20 % IV EMUL
240.0000 mL | INTRAVENOUS | Status: DC
  Filled 2014-12-23: qty 250

## 2014-12-23 MED ORDER — INSULIN ASPART 100 UNIT/ML ~~LOC~~ SOLN: 0.0000 [IU] | Freq: Three times a day (TID) | SUBCUTANEOUS | Status: DC

## 2014-12-23 NOTE — Progress Notes (Signed)
[facility] Progress Note  11 Days Post-Op  Subjective: Pt doing well, feels less bloated.  Having good flatus and continued BM's, but slowing.  No N/V, but gets full quickly.  Tolerating clears.  Ambulating some.    Objective: Vital signs in last 24 hours: Temp:  [98 F (36.7 C)-98.6 F (37 C)] 98.2 F (36.8 C) (07/20 0554) Pulse Rate:  [69-72] 69 (07/20 0554) Resp:  [18-20] 19 (07/20 0554) BP: (152-162)/(79-96) 152/79 mmHg (07/20 0554) SpO2:  [96 %-99 %] 97 % (07/20 0554) Last BM Date: 12/22/14  Intake/Output from previous day: 07/19 0701 - 07/20 0700 In: 3113.3 [P.O.:660; WUJ:8119.1]TPN:2453.3] Out: 2450 [Urine:2050; Stool:400] Intake/Output this shift:    PE: Gen:  Alert, NAD, pleasant Abd: Soft, less distended, NT, great BS, no HSM   Lab Results:   Recent Labs  12/22/14 0500 12/23/14 0525  WBC 9.3 9.6  HGB 10.3* 10.7*  HCT 30.0* 31.3*  PLT 289 320   BMET  Recent Labs  12/22/14 0500 12/23/14 0525  NA 132* 131*  K 3.8 3.8  CL 105 102  CO2 22 22  GLUCOSE 105* 104*  BUN 12 12  CREATININE 0.59* 0.62  CALCIUM 8.8* 8.8*   PT/INR No results for input(s): LABPROT, INR in the last 72 hours. CMP     Component Value Date/Time   NA 131* 12/23/2014 0525   K 3.8 12/23/2014 0525   CL 102 12/23/2014 0525   CO2 22 12/23/2014 0525   GLUCOSE 104* 12/23/2014 0525   BUN 12 12/23/2014 0525   CREATININE 0.62 12/23/2014 0525   CALCIUM 8.8* 12/23/2014 0525   PROT 6.1* 12/23/2014 0525   ALBUMIN 2.3* 12/23/2014 0525   AST 190* 12/23/2014 0525   ALT 131* 12/23/2014 0525   ALKPHOS 79 12/23/2014 0525   BILITOT 1.0 12/23/2014 0525   GFRNONAA >60 12/23/2014 0525   GFRAA >60 12/23/2014 0525   Lipase     Component Value Date/Time   LIPASE 50 12/13/2014 0555       Studies/Results: Dg Abd 1 View  12/22/2014   CLINICAL DATA: Persistent abdominal distention and pain  EXAM: ABDOMEN - 1 VIEW  COMPARISON:  12/21/2014  FINDINGS: Stable small-bowel distention is  noted although contrast from the recent exam as passed from the stomach and into the colon and is predominantly on. No free air is seen. No mass lesion is noted.  IMPRESSION: Stable small bowel dilatation likely related to ileus. Contrast material as passed into the colon from the recent upper GI study.   Electronically Signed   By: Alcide CleverMark  Lukens M.D.   On: 12/22/2014 08:37   Dg Chest Port 1 View  12/21/2014   CLINICAL DATA:  Leukocytosis.  EXAM: PORTABLE CHEST - 1 VIEW  COMPARISON:  December 15, 2014.  FINDINGS: The heart size and mediastinal contours are within normal limits. No pneumothorax is noted. Interval placement of right-sided PICC line is noted with distal tip overlying expected position of SVC. Increased interstitial densities are noted throughout both lungs most consistent with pulmonary edema or less likely atypical infection. No significant pleural effusion is noted. The visualized skeletal structures are unremarkable.  IMPRESSION: Increased bilateral interstitial densities are noted throughout both lungs most consistent with pulmonary edema or less likely interstitial pneumonitis. Interval placement of right-sided PICC line with distal tip overlying expected position of the SVC.   Electronically Signed   By: Lupita RaiderJames  Green Jr, M.D.   On: 12/21/2014 08:09   Dg Kayleen MemosUgi W/water Sol Cm  12/21/2014   CLINICAL DATA:  Status post repair of perforated duodenal ulcer. Contained leak from duodenum identified on previous upper GI series.  EXAM: WATER SOLUBLE UPPER GI SERIES  TECHNIQUE: Single-column upper GI series was performed using water soluble contrast.  CONTRAST:  OMNIPAQUE IOHEXOL 300 MG/ML  SOLN  COMPARISON:  12/16/2014.  FLUOROSCOPY TIME:  Radiation Exposure Index (as provided by the fluoroscopic device):  If the device does not provide the exposure index:  Fluoroscopy Time (in minutes and seconds):  5 minutes and 7 seconds.  Number of Acquired Images:  FINDINGS: Pre-procedure KUB shows gaseous small  bowel distention, likely related to ileus.  Patient was given water-soluble contrast material by mouth. Initial gastric emptying into the duodenum does not reveal contrast protruding into a small extraluminal pocket is seen on the previous study. There may be a tiny nipple like projection from the proximal duodenum, in the location of the collection previously, but this does not opacify an extraluminal pocket. This appearance may simply be related to mucosa or slight mucosal irregularity from previous surgery.  Patient was given additional water-soluble contrast material and put in multiple positions to try to promote better gastric emptying and re- stress the area of apparent leak on the previous study. However, after the initial episode of gastric emptying, there was very little contrast migration from the stomach into the duodenum. Under fluoroscopy, it was clear that very little duodenal or small bowel peristalsis was evident than that small bowel loops of the abdomen are gas-filled and distended, compatible with underlying ileus.  IMPRESSION: No evidence for contrast extravasation from the first portion of the duodenum on today's study. However, only 1 episode of gastric emptying was visualized on the current study and after the first bolus of contrast passed through the duodenum, very little contrast material migrated out of the stomach subsequently, despite nearly 11 minutes of observation. While no contrast extravasation is visualized from the duodenum today, study is somewhat limited by the lack of gastric emptying and small bowel peristalsis, both compatible with underlying adynamic ileus.  I discussed these findings by telephone with Dr. Johna Sheriff at approximately 12:50 p.m. on 12/21/2014.   Electronically Signed   By: Kennith Center M.D.   On: 12/21/2014 13:16    Anti-infectives: Anti-infectives    Start     Dose/Rate Route Frequency Ordered Stop   12/13/14 1000  piperacillin-tazobactam (ZOSYN) IVPB  3.375 g     3.375 g 12.5 mL/hr over 240 Minutes Intravenous Every 8 hours 12/13/14 0940 12/20/14 0604   12/12/14 2245  clindamycin (CLEOCIN) 900 mg, gentamicin (GARAMYCIN) 240 mg in sodium chloride 0.9 % 1,000 mL for intraperitoneal lavage  Status:  Discontinued    Comments:  Pharmacy may adjust dosing strength, schedule, rate of infusion, etc as needed to optimize therapy    Intraperitoneal To Surgery 12/12/14 2238 12/13/14 0205   12/12/14 2030  piperacillin-tazobactam (ZOSYN) IVPB 3.375 g     3.375 g 100 mL/hr over 30 Minutes Intravenous  Once 12/12/14 2020 12/12/14 2140       Assessment/Plan Perforated duodenal bulb ulcer  POD #11 laparoscopic omental patch and core liver biopsy---Dr. Michaell Cowing  -UGI showed small contained leak 7/13. Keep NPO. Repeat UGI 7/18 showed no leak, but noted delayed emptying/ileus.  KUB 7/19 shows contrast in colon, ileus improving -Will need EGD in 6-8 weeks to ensure ulcer has healed v. Biopsy. Also needs a screening colonoscopy at this time. -Pain control, needs to mobilize, PT following, pulmonary toilet -Protonix   IV, then  BID when tolerating PO x8 weeks  ID - Leukocytosis normalized. CXR with pulm edema, Urine cx NGTD. Completed abx.H pylori negative.  Cirrhosis, Alcohol and HCV - biopsy shows steatohepatitis and early cirrhosis on biopsy. HCV RNA [id number]. Appreciate medicine management.  Delirium-continue CIWA protocol. Appears more alert.  VTE prophylaxis-SCD/heparin FEN - start Fulls PCM - prealbumin 3.7. Albumin 2.5. On TPN, progress to 1/2 dose today. Dispo - Continue inpatient. PT/OT.     LOS: 10 days    Nonie Hoyer 12/23/2014, 7:55 AM Pager: [phone]

## 2014-12-23 NOTE — Progress Notes (Signed)
[facility] PROGRESS NOTE  BRANDIN STETZER ZOX:[id number] DOB: [birth year] DOA: 12/12/2014 PCP: No PCP Per Patient   Brief Summary The patient is a 64 year old male with history of alcohol and tobacco abuse who presented with a 2 day history of upper abdominal pain, nausea, anorexia. He had severe aching right upper quadrant pain that became progressively worse and therefore he came to the hospital. CT scan demonstrated moderate to large amount of free air and free fluid within the abdomen and pelvis tracking along the paracolic gutters consistent with bowel perforation. He also had some soft tissue inflammation around the gallbladder and his liver function tests were elevated concerning for possible cholecystitis. He was found to have a moderate to large right inguinal hernia and also some focal wall thickening of the antrum of the stomach which appeared stable from 2015. He was admitted by general surgery and underwent exploratory laparoscopy. He was found to have a perforated duodenal ulcer and had laparoscopic omental patch of his perforated ulcer as well as core liver biopsies. His postoperative course has been complicated by DTs. His liver biopsy demonstrated early cirrhosis and hepatic steatosis consistent with alcohol abuse. Internal medicine has been consulted secondary to the finding of cirrhosis and the need for follow-up.  Assessment/Plan: Perforated duodenal ulcer  - status post laparoscopic omental patch on 12/13/2014 with persistent leak - H. Pylori negative  - Counseled EtOH and NSAID avoidance - Continue BID PPI at discharge - Pt to f/u with GI scheduled with Dr. Randa Evens - General surgery following - No obvious leak on upper GI   Large left knee effusion due to loose body and arthritis, - improved - Orthopedics consult (last seen by Dr. Charlann Boxer 09/2013) for aspiration and possible injection - Continue Diclofenac gel  Leukocytosis - UA neg - CXR with kerley B lines  c/w interstitial edema, no obvious pneumonia >> d/c LR, but will hold off on lasix - Incentive Spirometry  Alcoholic cirrhosis  - with liver biopsy performed on 12/13/2014. coags and plt wnl.  - Would cont to avoid tylenol and tylenol containing medications - Counseled EtOH cessation and added cirrhosis education to discharge paperwork - LFT's seem to be rising. Have ordered abd [place] for interval change - pending  HCV infection - Hep A and B neg, given 1st vaccine in series on 7/14 - RNA quant [id number] (log 5.792) and genotype 1a - LFTs trending upwards - Abd [place] per above  Alcohol withdrawal,  - resolved.  - Continue thiamine, folate.  - SW to provide resources this coming week.  Delirium, likely secondary to EtOH w/d and acute illness and resolved  Elevated BP   - Scheduled metoprolol  - Continue prn metoprolol - Increased clonidine patch to 0.2mg  on 7/18 - BP seems improved  Hypokalemia,  - resolved with IV supplementation.  - Magnesium within normal limits  Hypoglycemia,  - resolved.  Normocytic anemia, hgb stable - Iron studies c/w possible iron deficiency although ferritin somewhat elevated (chronic disease), B12 1086, folate 7.1 - TSH 2.192 - Given feraheme on 7/15 x 1  Mild thrombocytopenia,  - acute phase reactant and resolved   Mildly prolonged QTc - avoid QTc prolonging medications - magnesium wnl  Moderate protein calorie malnutrition - TPN   Code Status: Full Family Communication: Pt in room (indicate person spoken with, relationship, and if by phone, the number) Disposition Plan: Pending   Consultants:  General Surgery  GI  Procedures: 7/17: Left knee aspiration by orthopedic surgery 7/18: Upper GI  series: Adynamic ileus and no obvious leak but imperfect study  Antibiotics:    HPI/Subjective: States feels better today  Objective: Filed Vitals:   12/22/14 1500 12/22/14 2114 12/23/14 0554 12/23/14 1441  BP:  162/96 156/92 152/79 148/97  Pulse: 70 72 69 73  Temp: 98 F (36.7 C) 98.6 F (37 C) 98.2 F (36.8 C) 98.2 F (36.8 C)  TempSrc: Oral Oral Oral Oral  Resp: 18 20 19 18   Height:      Weight:      SpO2: 99% 96% 97% 96%    Intake/Output Summary (Last 24 hours) at 12/23/14 1555 Last data filed at 12/23/14 [id number]  Gross per 24 hour  Intake 2693.34 ml  Output   2250 ml  Net 443.34 ml   Filed Weights   12/13/14 0215  Weight: 68.7 kg (151 lb 7.3 oz)    Exam:   General:  Awake, in nad  Cardiovascular: regular, s1, s2  Respiratory: normal resp effort, no wheezing  Abdomen: soft,nondistended  Musculoskeletal: perfused, no clubbing   Data Reviewed: Basic Metabolic Panel:  Recent Labs Lab 12/17/14 0325 12/18/14 0540 12/19/14 0443 12/20/14 0430 12/21/14 0500 12/22/14 0500 12/23/14 0525  NA 138 137 134* 133* 132* 132* 131*  K 3.7 3.5 3.9 3.4* 3.7 3.8 3.8  CL 106 107 102 102 102 105 102  CO2 27 28 27 26 23 22 22   GLUCOSE 98 129* 117* 104* 133* 105* 104*  BUN 15 8 8 8 9 12 12   CREATININE 0.80 0.61 0.63 0.72 0.67 0.59* 0.62  CALCIUM 8.2* 7.7* 8.1* 8.3* 8.6* 8.8* 8.8*  MG 2.0 1.6* 1.9 1.9 1.7 1.9 1.8  PHOS 3.0 1.8* 2.9 3.0 3.4  --   --    Liver Function Tests:  Recent Labs Lab 12/19/14 0443 12/20/14 0430 12/21/14 0500 12/22/14 0500 12/23/14 0525  AST 95* 73* 107* 131* 190*  ALT 87* 69* 75* 93* 131*  ALKPHOS 61 59 69 67 79  BILITOT 1.6* 1.7* 1.6* 0.9 1.0  PROT 5.5* 5.5* 6.3* 6.2* 6.1*  ALBUMIN 2.3* 2.3* 2.5* 2.4* 2.3*   No results for input(s): LIPASE, AMYLASE in the last 168 hours.  Recent Labs Lab 12/17/14 0804  AMMONIA 25   CBC:  Recent Labs Lab 12/18/14 0540 12/19/14 0443 12/20/14 0430 12/21/14 0500 12/22/14 0500 12/23/14 0525  WBC 6.5 9.5 9.0 11.0* 9.3 9.6  NEUTROABS 4.3  --   --  8.2*  --   --   HGB 10.0* 10.5* 10.1* 11.0* 10.3* 10.7*  HCT 29.3* 31.9* 29.2* 32.0* 30.0* 31.3*  MCV 89.9 89.1 88.2 88.4 88.0 88.4  PLT 152 168 210 302 289  320   Cardiac Enzymes: No results for input(s): CKTOTAL, CKMB, CKMBINDEX, TROPONINI in the last 168 hours. BNP (last 3 results) No results for input(s): BNP in the last 8760 hours.  ProBNP (last 3 results) No results for input(s): PROBNP in the last 8760 hours.  CBG:  Recent Labs Lab 12/22/14 1831 12/22/14 2347 12/23/14 0553 12/23/14 1159 12/23/14 1445  GLUCAP 124* 116* 120* 136* 95    Recent Results (from the past 240 hour(s))  Clostridium Difficile by PCR (not at [facility])     Status: None   Collection Time: 12/16/14  5:38 PM  Result Value Ref Range Status   C difficile by pcr NEGATIVE NEGATIVE Final  Culture, Urine     Status: None   Collection Time: 12/21/14  8:29 AM  Result Value Ref Range Status   Specimen Description URINE,  RANDOM  Final   Special Requests NONE  Final   Culture   Final    NO GROWTH 1 DAY Performed at [facility]    Report Status 12/22/2014 FINAL  Final     Studies: Dg Abd 1 View  12/22/2014   CLINICAL DATA: Persistent abdominal distention and pain  EXAM: ABDOMEN - 1 VIEW  COMPARISON:  12/21/2014  FINDINGS: Stable small-bowel distention is noted although contrast from the recent exam as passed from the stomach and into the colon and is predominantly on. No free air is seen. No mass lesion is noted.  IMPRESSION: Stable small bowel dilatation likely related to ileus. Contrast material as passed into the colon from the recent upper GI study.   Electronically Signed   By: Alcide Clever M.D.   On: 12/22/2014 08:37    Scheduled Meds: . antiseptic oral rinse  7 mL Mouth Rinse BID  . [START ON 12/27/2014] cloNIDine  0.2 mg Transdermal Weekly  . diclofenac sodium  2 g Topical QID  . heparin  5,000 Units Subcutaneous 3 times per day  . insulin aspart  0-9 Units Subcutaneous 3 times per day  . lip balm  1 application Topical BID  . metoprolol  5 mg Intravenous 4 times per day  . pantoprazole (PROTONIX) IV  40 mg Intravenous Q12H  . sodium chloride   10-40 mL Intracatheter Q12H  . thiamine IV  100 mg Intravenous Daily  . thiamine  100 mg Oral Daily   Continuous Infusions: . Marland KitchenTPN (CLINIMIX-E) Adult     And  . fat emulsion    . Marland KitchenTPN (CLINIMIX-E) Adult 70 mL/hr at 12/22/14 1751   And  . fat emulsion 240 mL (12/22/14 1751)  . lactated ringers with KCl 20 mEq/L 10 mL/hr at 12/21/14 1924    Principal Problem:   Perforated duodenal bulb ulcer s/p lap omental patch 12/13/2014 Active Problems:   Protein-calorie malnutrition, moderate   Alcohol abuse   Cirrhosis, alcoholic   Tobacco abuse   Colonoscopy refused   Inguinal hernia, right   Family history of colon cancer (father)   Effusion of left knee   Hepatitis C infection   Elevated blood pressure   Abnormal LFTs    CHIU, STEPHEN K  [facility] Pager [phone]. If 7PM-7AM, please contact night-coverage at [email or web address], password [facility] 12/23/2014, 3:55 PM  LOS: 10 days

## 2014-12-23 NOTE — Progress Notes (Signed)
Clinical Social Work  Patient was discussed during progression meeting and MD reports that patient might be able to discontinue TPN. [facility] reports [facility] are possibly interested in accepting patient but needs confirmation about TPN. CSW will continue to follow and keep SNF updated. Per director [facility](Hope Rife), if TPN needs to be continued then [facility].  [place] Fredy Gladu, [place] [phone]

## 2014-12-23 NOTE — Progress Notes (Addendum)
PARENTERAL NUTRITION CONSULT NOTE - Follow up  Pharmacy Consult for TPN Indication: perforated duodenal ulcer  Allergies  Allergen Reactions  . Nsaids Other (See Comments)    Perforated ulcer    Patient Measurements: Height:  (188 cm) Weight: 151 lb 7.3 oz (68.7 kg) IBW/kg (Calculated) : 82.2 . Vital Signs: Temp: 98.2 F (36.8 C) (07/20 0554) Temp Source: Oral (07/20 0554) BP: 152/79 mmHg (07/20 0554) Pulse Rate: 69 (07/20 0554) Intake/Output from previous day: 07/19 0701 - 07/20 0700 In: 3113.3 [P.O.:660; ZOX:0960.4] Out: 2450 [Urine:2050; Stool:400] Intake/Output from this shift:    Labs:  Recent Labs  12/21/14 0500 12/22/14 0500 12/23/14 0525  WBC 11.0* 9.3 9.6  HGB 11.0* 10.3* 10.7*  HCT 32.0* 30.0* 31.3*  PLT 302 289 320     Recent Labs  12/21/14 0500 12/22/14 0500 12/23/14 0525  NA 132* 132* 131*  K 3.7 3.8 3.8  CL 102 105 102  CO2 GLUCOSE 133* 105* 104*  BUN CREATININE 0.67 0.59* 0.62  CALCIUM 8.6* 8.8* 8.8*  MG 1.7 1.9 1.8  PHOS 3.4  --   --   PROT 6.3* 6.2* 6.1*  ALBUMIN 2.5* 2.4* 2.3*  AST 107* 131* 190*  ALT 75* 93* 131*  ALKPHOS 69 67 79  BILITOT 1.6* 0.9 1.0  PREALBUMIN 3.7*  --   --   TRIG 128  --   --   Corrected calcium 10 Estimated Creatinine Clearance: 93 mL/min (by C-G formula based on Cr of 0.62).    Recent Labs  12/22/14 1831 12/22/14 2347 12/23/14 0553  GLUCAP 124* 116* 120*    Medical History: Past Medical History  Diagnosis Date  . Delirium tremens 09/10/2013  . Dental abscess 09/10/2013  . Knee effusion, left 09/10/2013  . Mental status change 09/10/2013    Insulin Requirements: 1 unit SSI used in past 24 hours  Current Nutrition:  NPO Clinimix E 5/15 at 70 mL/hr Fat Emulsion 20% at 10 mL/hr  IVF: LR + KCl at 10 mL/hr  Central access: PICC placed 7/14 TPN start date: 7/14  ASSESSMENT                                                                                                           HPI: 37 YOM presented 7/9 with abdominal pain, underwent lap exploration 7/10 for free air and found to have perforated doudenal bulb ulcer s/p patch repair.  He has alcohol cirrhosis with elevated LFTs. Baseline malnutrition noted. Orders to start TPN as patient has been NPO since surgery.   Significant events:  7/10: Surgical repair of perforated DU 7/14: Started TPN 7/17: TPN advanced to goal  Today:   Glucose - CBGs at goal < 150; w/ no correction needed.  Electrolytes - Na remains low but stable, other electrolytes WNL (Ca corrects to WNL).  Renal - SCr and BUN WNL  LFTs - AST, ALT increased and TBili continues to improve  TGs - 132, 128  Prealbumin - 4.4, 3.7  NUTRITIONAL GOALS  RD recs: [id number] Kcals/d, 75-85 grams protein/d Clinimix E 5/15 at a goal rate of 4170ml/hr + 20% fat emulsion at 2610ml/hr will provide 84g/day protein, 1672Kcal/day.  PLAN                                                                                                                          Cont Clinimix E 5/15 at 70 ml/hr  Cont 20% fat emulsion at 5510ml/hr  Cont IVF at 10 mL/hr or per MD    TPN to contain standard multivitamins and trace elements.  Tbili WNL   TPN labs on Mondays and Thursdays.    Note patient currently has orders (per MD) for daily CMet, Mg, and CBC (until 8/12) - will need to enter TPN labs if these orders are changed.  Change CBGs/SSI to q8h  Continue to monitor LFTs closely as starting to rise  Juliette Alcideustin Zeigler, PharmD, BCPS.   Pager: [phone] 12/23/2014 7:11 AM  ADDENDUM:  Surgery has seen patient and plan to advance diet to full liquids and consult request to pharmacy to reduce TPN by half  At 18:00  Decrease Clinimix E 5/15 to 5735ml/hr   Decrease 20% fat emulsion to 355ml/hr  Follow how tolerates diet and orders to stop TPN  Juliette Alcideustin Zeigler, PharmD,  BCPS.   Pager: [phone] 12/23/2014 9:31 AM

## 2014-12-24 DIAGNOSIS — K7030 Alcoholic cirrhosis of liver without ascites: Secondary | ICD-10-CM

## 2014-12-24 DIAGNOSIS — Z720 Tobacco use: Secondary | ICD-10-CM

## 2014-12-24 LAB — COMPREHENSIVE METABOLIC PANEL
ALBUMIN: 2.3 g/dL — AB (ref 3.5–5.0)
ALK PHOS: 84 U/L (ref 38–126)
ALT: 173 U/L — AB (ref 17–63)
AST: 222 U/L — AB (ref 15–41)
Anion gap: 6 (ref 5–15)
BUN: 10 mg/dL (ref 6–20)
CALCIUM: 8.5 mg/dL — AB (ref 8.9–10.3)
CO2: 22 mmol/L (ref 22–32)
Chloride: 101 mmol/L (ref 101–111)
Creatinine, Ser: 0.78 mg/dL (ref 0.61–1.24)
GFR calc Af Amer: >60 mL/min (ref >60)
Glucose, Bld: 94 mg/dL (ref 65–99)
POTASSIUM: 4.1 mmol/L (ref 3.5–5.1)
SODIUM: 129 mmol/L — AB (ref 135–145)
Total Bilirubin: 1 mg/dL (ref 0.3–1.2)
Total Protein: 6.1 g/dL — ABNORMAL LOW (ref 6.5–8.1)

## 2014-12-24 LAB — GLUCOSE, CAPILLARY
GLUCOSE-CAPILLARY: 104 mg/dL — AB (ref 65–99)
GLUCOSE-CAPILLARY: 96 mg/dL (ref 65–99)
Glucose-Capillary: 105 mg/dL — ABNORMAL HIGH (ref 65–99)
Glucose-Capillary: 98 mg/dL (ref 65–99)
Glucose-Capillary: 98 mg/dL (ref 65–99)

## 2014-12-24 LAB — CBC
HEMATOCRIT: 31.1 % — AB (ref 39.0–52.0)
HEMOGLOBIN: 10.6 g/dL — AB (ref 13.0–17.0)
MCH: 29.8 pg (ref 26.0–34.0)
MCHC: 34.1 g/dL (ref 30.0–36.0)
MCV: 87.4 fL (ref 78.0–100.0)
PLATELETS: 317 10*3/uL (ref 150–400)
RBC: 3.56 MIL/uL — ABNORMAL LOW (ref 4.22–5.81)
RDW: 16.7 % — ABNORMAL HIGH (ref 11.5–15.5)
WBC: 9.2 10*3/uL (ref 4.0–10.5)

## 2014-12-24 LAB — PHOSPHORUS: PHOSPHORUS: 3.4 mg/dL (ref 2.5–4.6)

## 2014-12-24 LAB — MAGNESIUM: MAGNESIUM: 1.7 mg/dL (ref 1.7–2.4)

## 2014-12-24 MED ORDER — TRACE MINERALS CR-CU-MN-SE-ZN 10-1000-500-60 MCG/ML IV SOLN
INTRAVENOUS | Status: DC
  Administered 2014-12-24: 18:00:00 via INTRAVENOUS
  Filled 2014-12-24: qty 840

## 2014-12-24 MED ORDER — FAT EMULSION 20 % IV EMUL
120.0000 mL | INTRAVENOUS | Status: DC
  Administered 2014-12-24: 120 mL via INTRAVENOUS
  Filled 2014-12-24: qty 200

## 2014-12-24 NOTE — Progress Notes (Signed)
[facility] PROGRESS NOTE  Alfred [name] ZOX:[id number] DOB: [birth year] DOA: 12/12/2014 PCP: No PCP Per Patient   Brief Summary The patient is a 64 year old male with history of alcohol and tobacco abuse who presented with a 2 day history of upper abdominal pain, nausea, anorexia. He had severe aching right upper quadrant pain that became progressively worse and therefore he came to the hospital. CT scan demonstrated moderate to large amount of free air and free fluid within the abdomen and pelvis tracking along the paracolic gutters consistent with bowel perforation. He also had some soft tissue inflammation around the gallbladder and his liver function tests were elevated concerning for possible cholecystitis. He was found to have a moderate to large right inguinal hernia and also some focal wall thickening of the antrum of the stomach which appeared stable from 2015. He was admitted by general surgery and underwent exploratory laparoscopy. He was found to have a perforated duodenal ulcer and had laparoscopic omental patch of his perforated ulcer as well as core liver biopsies. His postoperative course has been complicated by DTs. His liver biopsy demonstrated early cirrhosis and hepatic steatosis consistent with alcohol abuse. Internal medicine has been consulted secondary to the finding of cirrhosis and the need for follow-up.  Assessment/Plan: Perforated duodenal ulcer  - status post laparoscopic omental patch on 12/13/2014 with persistent leak - H. Pylori negative  - Counseled on EtOH and NSAID avoidance - Continue BID PPI at discharge - Pt is to f/u with GI scheduled with Dr. Randa Evens - General surgery following - No obvious leak on upper GI   Large left knee effusion due to loose body and arthritis, - improved - Orthopedics consult (last seen by Dr. Charlann Boxer 09/2013) for aspiration and possible injection - Continue Diclofenac gel for now  Leukocytosis - UA neg - CXR with  kerley B lines c/w interstitial edema, no obvious pneumonia >> d/c LR, but will hold off on lasix - Continue Incentive Spirometry  Alcoholic cirrhosis  - With liver biopsy performed on 12/13/2014. coags and plt wnl.  - Would cont to avoid tylenol and tylenol containing medications - Counseled EtOH cessation and added cirrhosis education to discharge paperwork - LFT's had been rising. Abd [place] without change. Suspect elevated LFT's with TPN  HCV infection - Hep A and B neg, given 1st vaccine in series on 7/14 - RNA quant [id number] (log 5.792) and genotype 1a - LFTs trending upwards - Abd US findings noted as per above - Will benefit from f/u with GI  Alcohol withdrawal,  - resolved.  - Continue thiamine, folate.  - SW to provide resources this coming week.  Delirium, likely secondary to EtOH w/d and acute illness and resolved  Elevated BP   - Scheduled metoprolol  - Continue prn metoprolol - Increased clonidine patch to 0.2mg  on 7/18 - BP seems improved  Hypokalemia,  - resolved with IV supplementation.  - Magnesium within normal limits  Hypoglycemia,  - resolved.  Normocytic anemia, hgb stable - Iron studies c/w possible iron deficiency although ferritin somewhat elevated (chronic disease), B12 1086, folate 7.1 - TSH was 2.192 - Given feraheme on 7/15 x 1  Mild thrombocytopenia,  - acute phase reactant and resolved   Mildly prolonged QTc - avoid QTc prolonging medications - magnesium wnl  Moderate protein calorie malnutrition - TPN, weaning currently - PO as tolerated  Code Status: Full Family Communication: Pt in room Disposition Plan: Pending   Consultants:  General Surgery  GI  Procedures: 7/17:  Left knee aspiration by orthopedic surgery 7/18: Upper GI series: Adynamic ileus and no obvious leak but imperfect study  Antibiotics:    HPI/Subjective: Patient reports feeling better today  Objective: Filed Vitals:   12/23/14  1441 12/23/14 2144 12/24/14 0330 12/24/14 0606  BP: 148/97 152/96 136/91 141/87  Pulse: 73 73 73 76  Temp: 98.2 F (36.8 C) 97.9 F (36.6 C)  98.7 F (37.1 C)  TempSrc: Oral Oral  Oral  Resp: 18 18  18   Height:      Weight:      SpO2: 96% 98%  97%    Intake/Output Summary (Last 24 hours) at 12/24/14 1540 Last data filed at 12/24/14 1258  Gross per 24 hour  Intake 2195.92 ml  Output   3525 ml  Net -1329.08 ml   Filed Weights   12/13/14 0215  Weight: 68.7 kg (151 lb 7.3 oz)    Exam:   General:  Awake, laying in bed, in nad  Cardiovascular: regular, s1, s2  Respiratory: normal resp effort, no wheezing  Abdomen: soft,nondistended  Musculoskeletal: perfused, no clubbing, no cyanosis  Data Reviewed: Basic Metabolic Panel:  Recent Labs Lab 12/18/14 0540 12/19/14 0443 12/20/14 0430 12/21/14 0500 12/22/14 0500 12/23/14 0525 12/24/14 0425  NA 137 134* 133* 132* 132* 131* 129*  K 3.5 3.9 3.4* 3.7 3.8 3.8 4.1  CL 107 102 102 102 105 102 101  CO2 28 27 26 23 22 22 22   GLUCOSE 129* 117* 104* 133* 105* 104* 94  BUN 8 8 8 9 12 12 10   CREATININE 0.61 0.63 0.72 0.67 0.59* 0.62 0.78  CALCIUM 7.7* 8.1* 8.3* 8.6* 8.8* 8.8* 8.5*  MG 1.6* 1.9 1.9 1.7 1.9 1.8 1.7  PHOS 1.8* 2.9 3.0 3.4  --   --  3.4   Liver Function Tests:  Recent Labs Lab 12/20/14 0430 12/21/14 0500 12/22/14 0500 12/23/14 0525 12/24/14 0425  AST 73* 107* 131* 190* 222*  ALT 69* 75* 93* 131* 173*  ALKPHOS 59 69 67 79 84  BILITOT 1.7* 1.6* 0.9 1.0 1.0  PROT 5.5* 6.3* 6.2* 6.1* 6.1*  ALBUMIN 2.3* 2.5* 2.4* 2.3* 2.3*   No results for input(s): LIPASE, AMYLASE in the last 168 hours. No results for input(s): AMMONIA in the last 168 hours. CBC:  Recent Labs Lab 12/18/14 0540  12/20/14 0430 12/21/14 0500 12/22/14 0500 12/23/14 0525 12/24/14 0425  WBC 6.5  < > 9.0 11.0* 9.3 9.6 9.2  NEUTROABS 4.3  --   --  8.2*  --   --   --   HGB 10.0*  < > 10.1* 11.0* 10.3* 10.7* 10.6*  HCT 29.3*  < >  29.2* 32.0* 30.0* 31.3* 31.1*  MCV 89.9  < > 88.2 88.4 88.0 88.4 87.4  PLT 152  < > 210 302 289 320 317  < > = values in this interval not displayed. Cardiac Enzymes: No results for input(s): CKTOTAL, CKMB, CKMBINDEX, TROPONINI in the last 168 hours. BNP (last 3 results) No results for input(s): BNP in the last 8760 hours.  ProBNP (last 3 results) No results for input(s): PROBNP in the last 8760 hours.  CBG:  Recent Labs Lab 12/23/14 1814 12/23/14 2101 12/24/14 0618 12/24/14 0756 12/24/14 1232  GLUCAP 109* 113* 105* 104* 96    Recent Results (from the past 240 hour(s))  Clostridium Difficile by PCR (not at [facility])     Status: None   Collection Time: 12/16/14  5:38 PM  Result Value Ref Range Status  C difficile by pcr NEGATIVE NEGATIVE Final  Culture, Urine     Status: None   Collection Time: 12/21/14  8:29 AM  Result Value Ref Range Status   Specimen Description URINE, RANDOM  Final   Special Requests NONE  Final   Culture   Final    NO GROWTH 1 DAY Performed at [facility]    Report Status 12/22/2014 FINAL  Final     Studies: US Abdomen Limited Ruq  01-19-15   CLINICAL DATA:  Elevated LFTs, history cirrhosis, hepatitis-C  EXAM: US ABDOMEN LIMITED - RIGHT UPPER QUADRANT  COMPARISON:  CT abdomen and pelvis 12/12/2014  FINDINGS: Gallbladder:  Incompletely distended. Wall appears thickened at 5 mm thick, nonspecific in the setting of ascites. No gallstones or sonographic Murphy sign. Minimal pericholecystic fluid  Common bile duct:  Diameter: 3 mm diameter , normal  Liver:  Echogenic and slightly nodular consistent with cirrhosis. No focal hepatic mass identified. Hepatopetal portal venous flow.  Minimal ascites perihepatic.  IMPRESSION: Cirrhotic appearing liver with minimal perihepatic ascites.  Thickened gallbladder wall which is nonspecific in the setting of ascites.  Otherwise negative exam.   Electronically Signed   By: Ulyses Southward M.D.   On: 2015-01-19 16:59     Scheduled Meds: . antiseptic oral rinse  7 mL Mouth Rinse BID  . [START ON 12/27/2014] cloNIDine  0.2 mg Transdermal Weekly  . diclofenac sodium  2 g Topical QID  . heparin  5,000 Units Subcutaneous 3 times per day  . insulin aspart  0-9 Units Subcutaneous 3 times per day  . lip balm  1 application Topical BID  . metoprolol  5 mg Intravenous 4 times per day  . pantoprazole (PROTONIX) IV  40 mg Intravenous Q12H  . sodium chloride  10-40 mL Intracatheter Q12H  . thiamine IV  100 mg Intravenous Daily  . thiamine  100 mg Oral Daily   Continuous Infusions: . Marland KitchenTPN (CLINIMIX-E) Adult 35 mL/hr at Jan 19, 2015 1721   And  . fat emulsion 120 mL (19-Jan-2015 1722)  . Marland KitchenTPN (CLINIMIX-E) Adult     And  . fat emulsion    . lactated ringers with KCl 20 mEq/L 10 mL/hr at 12/21/14 1924    Principal Problem:   Perforated duodenal bulb ulcer s/p lap omental patch 12/13/2014 Active Problems:   Protein-calorie malnutrition, moderate   Alcohol abuse   Cirrhosis, alcoholic   Tobacco abuse   Colonoscopy refused   Inguinal hernia, right   Family history of colon cancer (father)   Effusion of left knee   Hepatitis C infection   Elevated blood pressure   Abnormal LFTs    CHIU, STEPHEN K  [facility] Pager [phone]. If 7PM-7AM, please contact night-coverage at [email or web address], password [facility] 12/24/2014, 3:40 PM  LOS: 11 days

## 2014-12-24 NOTE — Progress Notes (Signed)
Clinical Social Work  CSW spoke with PT who reports patient is doing better and can DC with HH. CSW met with patient who confirms he is feeling better and would like to pursue SA treatment at DC. Patient is interested in residential treatment. Patient signed ROI form for [facility] and CSW spoke with [facility] who requested clinicals be faxed and that they would return call to schedule an appointment.   Patient reports sister is involved and asked CSW to call and update sister with plans as well. CSW spoke with sister via phone who is happy that patient wants treatment and is agreeable to assist him as needed.  CSW will continue to follow.  [place],  [phone]

## 2014-12-24 NOTE — Progress Notes (Signed)
Physical Therapy Treatment Patient Details Name: Alfred [name] MRN: [id number] DOB: Sep 01, 1950 Today's Date: 20-Jan-2015    History of Present Illness  Pt is a 64 year old male admitted 12/11/14 with abdominal pain, H/O ETOH, L knee effusion. Perforated duodenal bulb ulcer s/p lap omental patch 12/13/2014    PT Comments    Progressing well, doing much better today, should be able to  D/C home or to substance abuse rehab--post acute; will follow  Follow Up Recommendations  Home health PT     Equipment Recommendations  Rolling walker with 5" wheels    Recommendations for Other Services       Precautions / Restrictions Precautions Precautions: Fall Restrictions Weight Bearing Restrictions: No    Mobility  Bed Mobility               General bed mobility comments: pt in chair  Transfers Overall transfer level: Needs assistance Equipment used: Rolling walker (2 wheeled) Transfers: Sit to/from Stand Sit to Stand: Min guard         General transfer comment: cues for hand palcement an safety  Ambulation/Gait Ambulation/Gait assistance: Min guard;Supervision Ambulation Distance (Feet): 230 Feet Assistive device: Rolling walker (2 wheeled) Gait Pattern/deviations: Step-through pattern;Decreased stride length     General Gait Details: verbal cues for safe use of RW, pt reports mild knee pain after amb 150' but able to continue   Stairs            Wheelchair Mobility    Modified Rankin (Stroke Patients Only)       Balance                                    Cognition Arousal/Alertness: Awake/alert Behavior During Therapy: WFL for tasks assessed/performed Overall Cognitive Status: Within Functional Limits for tasks assessed                      Exercises      General Comments        Pertinent Vitals/Pain Pain Assessment: No/denies pain Pain Intervention(s): Limited activity within patient's tolerance;Monitored during  session    Home Living                      Prior Function            PT Goals (current goals can now be found in the care plan section) Acute Rehab PT Goals Patient Stated Goal: patient did not state PT Goal Formulation: Patient unable to participate in goal setting Time For Goal Achievement: 12/28/14 Potential to Achieve Goals: Good Progress towards PT goals: Progressing toward goals    Frequency  Min 3X/week    PT Plan Current plan remains appropriate;Discharge plan needs to be updated    Co-evaluation             End of Session Equipment Utilized During Treatment: Gait belt Activity Tolerance: Patient tolerated treatment well Patient left: in chair;with call bell/phone within reach;with chair alarm set     Time: 1355-1408 PT Time Calculation (min) (ACUTE ONLY): 13 min  Charges:  $Gait Training: 8-22 mins                    G Codes:      Maxcine Strong 01-20-2015, 2:33 PM

## 2014-12-24 NOTE — Progress Notes (Signed)
Patient ID: Alfred [name], male   DOB: [birth year], 64 y.o.   MRN: [id number] 12 Days Post-Op  Subjective: Denies abdominal pain or nausea. He did not eat any breakfast because he did not have much appetite but plans to eat later today. Has had bowel movements.  Objective: Vital signs in last 24 hours: Temp:  [97.9 F (36.6 C)-98.7 F (37.1 C)] 98.7 F (37.1 C) (07/21 0606) Pulse Rate:  [73-76] 76 (07/21 0606) Resp:  [18] 18 (07/21 0606) BP: (136-152)/(87-97) 141/87 mmHg (07/21 0606) SpO2:  [96 %-98 %] 97 % (07/21 0606) Last BM Date: 12/23/14  Intake/Output from previous day: 07/20 0701 - 07/21 0700 In: 2195.9 [P.O.:250; WUJ:8119.1] Out: 4350 [Urine:4350] Intake/Output this shift:    General appearance: alert, cooperative and no distress GI: minimal distention. Soft and nontender. Neurologic: much more alert and appropriate each day. Incision/Wound: clean and dry  Lab Results:   Recent Labs  12/23/14 0525 12/24/14 0425  WBC 9.6 9.2  HGB 10.7* 10.6*  HCT 31.3* 31.1*  PLT 320 317   BMET  Recent Labs  12/23/14 0525 12/24/14 0425  NA 131* 129*  K 3.8 4.1  CL 102 101  CO2 22 22  GLUCOSE 104* 94  BUN 12 10  CREATININE 0.62 0.78  CALCIUM 8.8* 8.5*     Studies/Results: US Abdomen Limited Ruq  12/23/2014   CLINICAL DATA:  Elevated LFTs, history cirrhosis, hepatitis-C  EXAM: US ABDOMEN LIMITED - RIGHT UPPER QUADRANT  COMPARISON:  CT abdomen and pelvis 12/12/2014  FINDINGS: Gallbladder:  Incompletely distended. Wall appears thickened at 5 mm thick, nonspecific in the setting of ascites. No gallstones or sonographic Murphy sign. Minimal pericholecystic fluid  Common bile duct:  Diameter: 3 mm diameter , normal  Liver:  Echogenic and slightly nodular consistent with cirrhosis. No focal hepatic mass identified. Hepatopetal portal venous flow.  Minimal ascites perihepatic.  IMPRESSION: Cirrhotic appearing liver with minimal perihepatic ascites.  Thickened gallbladder wall  which is nonspecific in the setting of ascites.  Otherwise negative exam.   Electronically Signed   By: Ulyses Southward M.D.   On: 12/23/2014 16:59    Anti-infectives: Anti-infectives    Start     Dose/Rate Route Frequency Ordered Stop   12/13/14 1000  piperacillin-tazobactam (ZOSYN) IVPB 3.375 g     3.375 g 12.5 mL/hr over 240 Minutes Intravenous Every 8 hours 12/13/14 0940 12/20/14 0604   12/12/14 2245  clindamycin (CLEOCIN) 900 mg, gentamicin (GARAMYCIN) 240 mg in sodium chloride 0.9 % 1,000 mL for intraperitoneal lavage  Status:  Discontinued    Comments:  Pharmacy may adjust dosing strength, schedule, rate of infusion, etc as needed to optimize therapy    Intraperitoneal To Surgery 12/12/14 2238 12/13/14 0205   12/12/14 2030  piperacillin-tazobactam (ZOSYN) IVPB 3.375 g     3.375 g 100 mL/hr over 30 Minutes Intravenous  Once 12/12/14 2020 12/12/14 2140      Assessment/Plan: s/p Procedure(s): LAPAROSCOPIC ABDOMINAL EXPLORATION Omental patch for perforation and core liver biopsies Postoperative contained leak, resolved on the last study Ileus which appears to be improving. Would continue full liquid diet today. Hopefully stable for discharge soon.   LOS: 11 days    Khris Jansson T 12/24/2014

## 2014-12-24 NOTE — Progress Notes (Signed)
PARENTERAL NUTRITION CONSULT NOTE - Follow up  Pharmacy Consult for TPN Indication: perforated duodenal ulcer  Allergies  Allergen Reactions  . Nsaids Other (See Comments)    Perforated ulcer    Patient Measurements: Height:  (188 cm) Weight: 151 lb 7.3 oz (68.7 kg) IBW/kg (Calculated) : 82.2 . Vital Signs: Temp: 98.7 F (37.1 C) (07/21 0606) Temp Source: Oral (07/21 0606) BP: 141/87 mmHg (07/21 0606) Pulse Rate: 76 (07/21 0606) Intake/Output from previous day: 07/20 0701 - 07/21 0700 In: 2195.9 [P.O.:250; KGM:0102.7] Out: 4350 [Urine:4350] Intake/Output from this shift:    Labs:  Recent Labs  12/22/14 0500 12/23/14 0525 12/24/14 0425  WBC 9.3 9.6 9.2  HGB 10.3* 10.7* 10.6*  HCT 30.0* 31.3* 31.1*  PLT 289 320 317     Recent Labs  12/22/14 0500 12/23/14 0525 12/24/14 0425  NA 132* 131* 129*  K 3.8 3.8 4.1  CL 105 102 101  CO2 GLUCOSE 105* 104* 94  BUN CREATININE 0.59* 0.62 0.78  CALCIUM 8.8* 8.8* 8.5*  MG 1.9 1.8 1.7  PHOS  --   --  3.4  PROT 6.2* 6.1* 6.1*  ALBUMIN 2.4* 2.3* 2.3*  AST 131* 190* 222*  ALT 93* 131* 173*  ALKPHOS 67 79 84  BILITOT 0.9 1.0 1.0  Corrected calcium 10 Estimated Creatinine Clearance: 93 mL/min (by C-G formula based on Cr of 0.78).    Recent Labs  12/23/14 1814 12/23/14 2101 12/24/14 0618  GLUCAP 109* 113* 105*    Medical History: Past Medical History  Diagnosis Date  . Delirium tremens 09/10/2013  . Dental abscess 09/10/2013  . Knee effusion, left 09/10/2013  . Mental status change 09/10/2013    Insulin Requirements: no insulin used in past 24 hours  Current Nutrition:  - Ate 75% of dinner on 7/20, didn't eat breakfast today - full liquid diet started on 7/20 - Clinimix E 5/15 at 70 mL/hr - Fat Emulsion 20% at 10 mL/hr  IVF: LR + KCl at 10 mL/hr  Central access: PICC placed 7/14 TPN start date: 7/14  ASSESSMENT                                                                                                           HPI: 74 YOM presented 7/9 with abdominal pain, underwent lap exploration 7/10 for free air and found to have perforated doudenal bulb ulcer s/p patch repair.  He has alcohol cirrhosis with elevated LFTs. Baseline malnutrition noted. Orders to start TPN as patient has been NPO since surgery.   Significant events:  7/10: Surgical repair of perforated DU 7/14: Started TPN 7/17: TPN advanced to goal 7/20: full liquid diet, TPN rate reduced by half to 35 ml/hr  Today:   Glucose - CBGs at goal < 150; w/ no correction needed.  Electrolytes - Na low 129, other electrolytes WNL (Ca corrects to WNL).  Renal - SCr and BUN WNL  LFTs - AST/ALT trending up; TBili wnl  TGs - 132, 128  Prealbumin -  4.4, 3.7  NUTRITIONAL GOALS                                                                                             RD recs: 1610-9604 Kcals/d, 75-85 grams protein/d Clinimix E 5/15 at a goal rate of 58ml/hr + 20% fat emulsion at 62ml/hr will provide 84g/day protein, 1672Kcal/day.  PLAN                                                                                                                          Per Dr. Johna Sheriff, since patient still has poor oral intake, cont Clinimix E 5/15 at 35 ml/hr for now  Cont 20% fat emulsion at 5 ml/hr  Cont IVF at 10 mL/hr or per MD    TPN to contain standard multivitamins and trace elements.   TPN labs on Mondays and Thursdays.    Note patient currently has orders (per MD) for daily CMet, Mg, and CBC (until 8/12) - will need to enter TPN labs if these orders are changed.  Change CBGs/SSI to q8h  Continue to monitor LFTs closely as starting to rise  Dorna Leitz, PharmD, BCPS 12/24/2014 7:52 AM

## 2014-12-25 DIAGNOSIS — K4090 Unilateral inguinal hernia, without obstruction or gangrene, not specified as recurrent: Secondary | ICD-10-CM

## 2014-12-25 LAB — GLUCOSE, CAPILLARY
GLUCOSE-CAPILLARY: 113 mg/dL — AB (ref 65–99)
Glucose-Capillary: 105 mg/dL — ABNORMAL HIGH (ref 65–99)
Glucose-Capillary: 83 mg/dL (ref 65–99)

## 2014-12-25 LAB — CBC
HEMATOCRIT: 32.3 % — AB (ref 39.0–52.0)
Hemoglobin: 11.2 g/dL — ABNORMAL LOW (ref 13.0–17.0)
MCH: 30.4 pg (ref 26.0–34.0)
MCHC: 34.7 g/dL (ref 30.0–36.0)
MCV: 87.5 fL (ref 78.0–100.0)
Platelets: 326 10*3/uL (ref 150–400)
RBC: 3.69 MIL/uL — ABNORMAL LOW (ref 4.22–5.81)
RDW: 16.6 % — ABNORMAL HIGH (ref 11.5–15.5)
WBC: 11.3 10*3/uL — ABNORMAL HIGH (ref 4.0–10.5)

## 2014-12-25 LAB — COMPREHENSIVE METABOLIC PANEL
ALT: 194 U/L — ABNORMAL HIGH (ref 17–63)
ANION GAP: 5 (ref 5–15)
AST: 212 U/L — AB (ref 15–41)
Albumin: 2.5 g/dL — ABNORMAL LOW (ref 3.5–5.0)
Alkaline Phosphatase: 89 U/L (ref 38–126)
BUN: 9 mg/dL (ref 6–20)
CO2: 24 mmol/L (ref 22–32)
Calcium: 8.3 mg/dL — ABNORMAL LOW (ref 8.9–10.3)
Chloride: 100 mmol/L — ABNORMAL LOW (ref 101–111)
Creatinine, Ser: 0.68 mg/dL (ref 0.61–1.24)
GFR calc non Af Amer: >60 mL/min (ref >60)
Glucose, Bld: 101 mg/dL — ABNORMAL HIGH (ref 65–99)
POTASSIUM: 4.1 mmol/L (ref 3.5–5.1)
Sodium: 129 mmol/L — ABNORMAL LOW (ref 135–145)
Total Bilirubin: 1.7 mg/dL — ABNORMAL HIGH (ref 0.3–1.2)
Total Protein: 6.2 g/dL — ABNORMAL LOW (ref 6.5–8.1)

## 2014-12-25 LAB — MAGNESIUM: MAGNESIUM: 1.7 mg/dL (ref 1.7–2.4)

## 2014-12-25 MED ORDER — ENSURE ENLIVE PO LIQD
237.0000 mL | Freq: Three times a day (TID) | ORAL | Status: DC
  Administered 2014-12-25 (×2): 237 mL via ORAL

## 2014-12-25 NOTE — Progress Notes (Signed)
Clinical Social Work  Per MD, patient might be ready to DC over the weekend. CSW spoke with [facility] who scheduled patient an intake appointment on Tuesday (12/29/14). CSW placed information on AVS and spoke with patient and sister to explain information. Patient reports he is happy to have an appointment. Sister engaged and reports she will assist with transportation to appointment.   CSW will continue to follow to provide support throughout hospitalization.  [place], [place] [phone]

## 2014-12-25 NOTE — Care Management Note (Signed)
Case Management Note  Patient Details  Name: Alfred [name] MRN: [id number] Date of Birth: [birth year]  Subjective/Objective:                 abd pain   Action/Plan: vsnf   Expected Discharge Date:        [id number]          Expected Discharge Plan:  Skilled Nursing Facility  In-House Referral:  Clinical Social Work  Discharge planning Services  CM Consult  Post Acute Care Choice:  NA Choice offered to:  NA  DME Arranged:  N/A DME Agency:  NA  HH Arranged:  NA HH Agency:  NA  Status of Service:  In process, will continue to follow  Medicare Important Message Given:    Date Medicare IM Given:    Medicare IM give by:    Date Additional Medicare IM Given:    Additional Medicare Important Message give by:     If discussed at Long Length of Stay Meetings, dates discussed:  [id number]  Additional Comments:  Golda Acre, RN 12/25/2014, 10:15 AM

## 2014-12-25 NOTE — Progress Notes (Signed)
[facility] PROGRESS NOTE  Alfred [name] ZOX:[id number] DOB: 1951/03/27 DOA: 12/12/2014 PCP: No PCP Per Patient   Brief Summary The patient is a 64 year old male with history of alcohol and tobacco abuse who presented with a 2 day history of upper abdominal pain, nausea, anorexia. He had severe aching right upper quadrant pain that became progressively worse and therefore he came to the hospital. CT scan demonstrated moderate to large amount of free air and free fluid within the abdomen and pelvis tracking along the paracolic gutters consistent with bowel perforation. He also had some soft tissue inflammation around the gallbladder and his liver function tests were elevated concerning for possible cholecystitis. He was found to have a moderate to large right inguinal hernia and also some focal wall thickening of the antrum of the stomach which appeared stable from 2015. He was admitted by general surgery and underwent exploratory laparoscopy. He was found to have a perforated duodenal ulcer and had laparoscopic omental patch of his perforated ulcer as well as core liver biopsies. His postoperative course has been complicated by DTs. His liver biopsy demonstrated early cirrhosis and hepatic steatosis consistent with alcohol abuse. Internal medicine has been consulted secondary to the finding of cirrhosis and the need for follow-up.  Assessment/Plan: Perforated duodenal ulcer  - status post laparoscopic omental patch on 12/13/2014 with persistent leak - H. Pylori negative  - Counseled on EtOH and NSAID avoidance - Continue BID PPI at discharge - Pt is to f/u with GI scheduled with Dr. Randa Evens - General surgery continues to follow - No obvious leak on upper GI   Large left knee effusion due to loose body and arthritis, - improved - Orthopedics consult (last seen by Dr. Charlann Boxer 09/2013) for aspiration and possible injection - Continue Diclofenac gel for now  Leukocytosis - UA neg -  CXR with kerley B lines c/w interstitial edema, no obvious pneumonia >> d/c LR, but will hold off on lasix - Continue Incentive Spirometry as tolerated  Alcoholic cirrhosis  - With liver biopsy performed on 12/13/2014. coags and plt wnl.  - Would cont to avoid tylenol and tylenol containing medications - Counseled EtOH cessation and added cirrhosis education to discharge paperwork - LFT's had recently been rising. Abd [place] without significant change. Suspecting elevated LFT's secondary to TPN - TPN has been stopped per Surgery  HCV infection - Hep A and B neg, given 1st vaccine in series on 7/14 - RNA quant [id number] (log 5.792) and genotype 1a - LFTs trending upwards - Abd US findings noted as per above - Will benefit from f/u with GI as outpatient  Alcohol withdrawal,  - resolved.  - Continue thiamine, folate.  - SW to provide resources this coming week.  Delirium, likely secondary to EtOH w/d and acute illness and resolved  Elevated BP   - Scheduled metoprolol  - Continue prn metoprolol - Increased clonidine patch to 0.2mg  on 7/18 - BP seems improved  Hypokalemia,  - resolved with IV supplementation.  - Magnesium within normal limits  Hypoglycemia,  - resolved.  Normocytic anemia, hgb stable - Iron studies c/w possible iron deficiency although ferritin somewhat elevated (chronic disease), B12 1086, folate 7.1 - TSH was 2.192 - Given feraheme on 7/15 x 1  Mild thrombocytopenia,  - acute phase reactant and resolved   Mildly prolonged QTc - avoid QTc prolonging medications - magnesium wnl  Moderate protein calorie malnutrition - TPN, weaning currently - PO as tolerated  Code Status: Full Family Communication: Pt  in room Disposition Plan: Pending   Consultants:  General Surgery  GI  Procedures: 7/17: Left knee aspiration by orthopedic surgery 7/18: Upper GI series: Adynamic ileus and no obvious leak but imperfect  study  Antibiotics:    HPI/Subjective: Eager to go home. No complaints  Objective: Filed Vitals:   12/24/14 1400 12/24/14 2200 12/25/14 0400 12/25/14 1444  BP: 122/89 139/90 140/84 133/84  Pulse: 74 73 76 70  Temp: 98.1 F (36.7 C) 98.4 F (36.9 C) 99.2 F (37.3 C) 98.2 F (36.8 C)  TempSrc: Oral Oral Oral Oral  Resp: 18 20 20 18   Height:      Weight:      SpO2: 99% 99% 97% 99%    Intake/Output Summary (Last 24 hours) at 12/25/14 1804 Last data filed at 12/25/14 1047  Gross per 24 hour  Intake   1021 ml  Output    550 ml  Net    471 ml   Filed Weights   12/13/14 0215  Weight: 68.7 kg (151 lb 7.3 oz)    Exam:   General:  Awake, laying in bed watching TV in nad  Cardiovascular: regular, s1, s2  Respiratory: normal resp effort, no wheezing  Abdomen: soft,nondistended, pos BS  Musculoskeletal: perfused, no cyanosis  Data Reviewed: Basic Metabolic Panel:  Recent Labs Lab 12/19/14 0443 12/20/14 0430 12/21/14 0500 12/22/14 0500 12/23/14 0525 12/24/14 0425 12/25/14 0441  NA 134* 133* 132* 132* 131* 129* 129*  K 3.9 3.4* 3.7 3.8 3.8 4.1 4.1  CL 102 102 102 105 102 101 100*  CO2 27 26 23 22 22 22 24   GLUCOSE 117* 104* 133* 105* 104* 94 101*  BUN 8 8 9 12 12 10 9   CREATININE 0.63 0.72 0.67 0.59* 0.62 0.78 0.68  CALCIUM 8.1* 8.3* 8.6* 8.8* 8.8* 8.5* 8.3*  MG 1.9 1.9 1.7 1.9 1.8 1.7 1.7  PHOS 2.9 3.0 3.4  --   --  3.4  --    Liver Function Tests:  Recent Labs Lab 12/21/14 0500 12/22/14 0500 12/23/14 0525 12/24/14 0425 12/25/14 0441  AST 107* 131* 190* 222* 212*  ALT 75* 93* 131* 173* 194*  ALKPHOS 69 67 79 84 89  BILITOT 1.6* 0.9 1.0 1.0 1.7*  PROT 6.3* 6.2* 6.1* 6.1* 6.2*  ALBUMIN 2.5* 2.4* 2.3* 2.3* 2.5*   No results for input(s): LIPASE, AMYLASE in the last 168 hours. No results for input(s): AMMONIA in the last 168 hours. CBC:  Recent Labs Lab 12/21/14 0500 12/22/14 0500 12/23/14 0525 12/24/14 0425 12/25/14 0441  WBC 11.0*  9.3 9.6 9.2 11.3*  NEUTROABS 8.2*  --   --   --   --   HGB 11.0* 10.3* 10.7* 10.6* 11.2*  HCT 32.0* 30.0* 31.3* 31.1* 32.3*  MCV 88.4 88.0 88.4 87.4 87.5  PLT 302 289 320 317 326   Cardiac Enzymes: No results for input(s): CKTOTAL, CKMB, CKMBINDEX, TROPONINI in the last 168 hours. BNP (last 3 results) No results for input(s): BNP in the last 8760 hours.  ProBNP (last 3 results) No results for input(s): PROBNP in the last 8760 hours.  CBG:  Recent Labs Lab 12/24/14 1232 12/24/14 1702 12/24/14 2206 12/25/14 0532 12/25/14 1108  GLUCAP 96 98 98 105* 113*    Recent Results (from the past 240 hour(s))  Clostridium Difficile by PCR (not at [facility])     Status: None   Collection Time: 12/16/14  5:38 PM  Result Value Ref Range Status   C difficile by  pcr NEGATIVE NEGATIVE Final  Culture, Urine     Status: None   Collection Time: 12/21/14  8:29 AM  Result Value Ref Range Status   Specimen Description URINE, RANDOM  Final   Special Requests NONE  Final   Culture   Final    NO GROWTH 1 DAY Performed at [facility]    Report Status 12/22/2014 FINAL  Final     Studies: No results found.  Scheduled Meds: . antiseptic oral rinse  7 mL Mouth Rinse BID  . [START ON 12/27/2014] cloNIDine  0.2 mg Transdermal Weekly  . diclofenac sodium  2 g Topical QID  . feeding supplement (ENSURE ENLIVE)  237 mL Oral TID BM  . heparin  5,000 Units Subcutaneous 3 times per day  . lip balm  1 application Topical BID  . metoprolol  5 mg Intravenous 4 times per day  . pantoprazole (PROTONIX) IV  40 mg Intravenous Q12H  . sodium chloride  10-40 mL Intracatheter Q12H  . thiamine IV  100 mg Intravenous Daily  . thiamine  100 mg Oral Daily   Continuous Infusions: . lactated ringers with KCl 20 mEq/L 10 mL/hr at 12/25/14 0400    Principal Problem:   Perforated duodenal bulb ulcer s/p lap omental patch 12/13/2014 Active Problems:   Protein-calorie malnutrition, moderate   Alcohol abuse    Cirrhosis, alcoholic   Tobacco abuse   Colonoscopy refused   Inguinal hernia, right   Family history of colon cancer (father)   Effusion of left knee   Hepatitis C infection   Elevated blood pressure   Abnormal LFTs    CHIU, STEPHEN K  [facility] Pager [phone]. If 7PM-7AM, please contact night-coverage at [email or web address], password [facility] 12/25/2014, 6:04 PM  LOS: 12 days

## 2014-12-25 NOTE — Progress Notes (Signed)
[facility] Progress Note  13 Days Post-Op  Subjective: Pt feels great.  No N/V, appetite improving.  Having BM's and flatus.  Working with therapies.  No other concerns.  Objective: Vital signs in last 24 hours: Temp:  [98.1 F (36.7 C)-99.2 F (37.3 C)] 99.2 F (37.3 C) (07/22 0400) Pulse Rate:  [73-76] 76 (07/22 0400) Resp:  [18-20] 20 (07/22 0400) BP: (122-140)/(84-90) 140/84 mmHg (07/22 0400) SpO2:  [97 %-99 %] 97 % (07/22 0400) Last BM Date: 12/23/14  Intake/Output from previous day: 07/21 0701 - 07/22 0700 In: 1381 [P.O.:600; TPN:781] Out: 225 [Urine:225] Intake/Output this shift:    PE: Gen:  Alert, NAD, pleasant Abd: Soft, much less distended, NT, +BS, no HSM, incisions C/D/I with dermabond in place   Lab Results:   Recent Labs  12/24/14 0425 12/25/14 0441  WBC 9.2 11.3*  HGB 10.6* 11.2*  HCT 31.1* 32.3*  PLT 317 326   BMET  Recent Labs  12/24/14 0425 12/25/14 0441  NA 129* 129*  K 4.1 4.1  CL 101 100*  CO2 22 24  GLUCOSE 94 101*  BUN 10 9  CREATININE 0.78 0.68  CALCIUM 8.5* 8.3*   PT/INR No results for input(s): LABPROT, INR in the last 72 hours. CMP     Component Value Date/Time   NA 129* 12/25/2014 0441   K 4.1 12/25/2014 0441   CL 100* 12/25/2014 0441   CO2 24 12/25/2014 0441   GLUCOSE 101* 12/25/2014 0441   BUN 9 12/25/2014 0441   CREATININE 0.68 12/25/2014 0441   CALCIUM 8.3* 12/25/2014 0441   PROT 6.2* 12/25/2014 0441   ALBUMIN 2.5* 12/25/2014 0441   AST 212* 12/25/2014 0441   ALT 194* 12/25/2014 0441   ALKPHOS 89 12/25/2014 0441   BILITOT 1.7* 12/25/2014 0441   GFRNONAA >60 12/25/2014 0441   GFRAA >60 12/25/2014 0441   Lipase     Component Value Date/Time   LIPASE 50 12/13/2014 0555       Studies/Results: US Abdomen Limited Ruq  12/23/2014   CLINICAL DATA:  Elevated LFTs, history cirrhosis, hepatitis-C  EXAM: US ABDOMEN LIMITED - RIGHT UPPER QUADRANT  COMPARISON:  CT abdomen and pelvis 12/12/2014   FINDINGS: Gallbladder:  Incompletely distended. Wall appears thickened at 5 mm thick, nonspecific in the setting of ascites. No gallstones or sonographic Murphy sign. Minimal pericholecystic fluid  Common bile duct:  Diameter: 3 mm diameter , normal  Liver:  Echogenic and slightly nodular consistent with cirrhosis. No focal hepatic mass identified. Hepatopetal portal venous flow.  Minimal ascites perihepatic.  IMPRESSION: Cirrhotic appearing liver with minimal perihepatic ascites.  Thickened gallbladder wall which is nonspecific in the setting of ascites.  Otherwise negative exam.   Electronically Signed   By: Ulyses Southward M.D.   On: 12/23/2014 16:59    Anti-infectives: Anti-infectives    Start     Dose/Rate Route Frequency Ordered Stop   12/13/14 1000  piperacillin-tazobactam (ZOSYN) IVPB 3.375 g     3.375 g 12.5 mL/hr over 240 Minutes Intravenous Every 8 hours 12/13/14 0940 12/20/14 0604   12/12/14 2245  clindamycin (CLEOCIN) 900 mg, gentamicin (GARAMYCIN) 240 mg in sodium chloride 0.9 % 1,000 mL for intraperitoneal lavage  Status:  Discontinued    Comments:  Pharmacy may adjust dosing strength, schedule, rate of infusion, etc as needed to optimize therapy    Intraperitoneal To Surgery 12/12/14 2238 12/13/14 0205   12/12/14 2030  piperacillin-tazobactam (ZOSYN) IVPB 3.375 g     3.375  g 100 mL/hr over 30 Minutes Intravenous  Once 12/12/14 2020 12/12/14 2140       Assessment/Plan Perforated duodenal bulb ulcer  POD #13 laparoscopic omental patch and core liver biopsy---Dr. Michaell Cowing  -UGI showed small contained leak 7/13. Keep NPO. Repeat UGI 7/18 showed no leak, but noted delayed emptying/ileus. KUB 7/19 shows contrast in colon, ileus improving -Will need EGD in 6-8 weeks to ensure ulcer has healed v. Biopsy. Also needs a screening colonoscopy at this time. -Pain control, needs to mobilize, PT following, pulmonary toilet -Protonix  IV, then  BID when tolerating PO x8 weeks  ID -  mild elevation today to 11.3. CXR with pulm edema, Urine cx NGTD. Completed abx.H pylori negative.  Cirrhosis, Alcohol and HCV - biopsy shows steatohepatitis and early cirrhosis on biopsy. HCV RNA [id number]. Appreciate medicine management.  Delirium/ETOH withdrawal - resolved, CIWA but not using it Left knee effusion VTE prophylaxis-SCD/heparin FEN - Advance to soft diet today PCM - prealbumin 3.7. Albumin 2.5. D/c TPN now that advancing diet.  Add ensure. Dispo - Maybe d/c tomorrow if medically stable.      LOS: 12 days    Nonie Hoyer 12/25/2014, 9:46 AM Pager: [phone]

## 2014-12-25 NOTE — Progress Notes (Addendum)
PARENTERAL NUTRITION CONSULT NOTE - Follow up  Pharmacy Consult for TPN Indication: perforated duodenal ulcer  Allergies  Allergen Reactions  . Nsaids Other (See Comments)    Perforated ulcer    Patient Measurements: Height:  (188 cm) Weight: 151 lb 7.3 oz (68.7 kg) IBW/kg (Calculated) : 82.2 . Vital Signs: Temp: 99.2 F (37.3 C) (07/22 0400) Temp Source: Oral (07/22 0400) BP: 140/84 mmHg (07/22 0400) Pulse Rate: 76 (07/22 0400) Intake/Output from previous day: 07/21 0701 - 07/22 0700 In: 1381 [P.O.:600; TPN:781] Out: 225 [Urine:225] Intake/Output from this shift:    Labs:  Recent Labs  12/23/14 0525 12/24/14 0425 12/25/14 0441  WBC 9.6 9.2 11.3*  HGB 10.7* 10.6* 11.2*  HCT 31.3* 31.1* 32.3*  PLT 320 317 326     Recent Labs  12/23/14 0525 12/24/14 0425 12/25/14 0441  NA 131* 129* 129*  K 3.8 4.1 4.1  CL 102 101 100*  CO2 GLUCOSE 104* 94 101*  BUN CREATININE 0.62 0.78 0.68  CALCIUM 8.8* 8.5* 8.3*  MG 1.8 1.7 1.7  PHOS  --  3.4  --   PROT 6.1* 6.1* 6.2*  ALBUMIN 2.3* 2.3* 2.5*  AST 190* 222* 212*  ALT 131* 173* 194*  ALKPHOS 79 84 89  BILITOT 1.0 1.0 1.7*  Corrected calcium 10 Estimated Creatinine Clearance: 93 mL/min (by C-G formula based on Cr of 0.68).    Recent Labs  12/24/14 1702 12/24/14 2206 12/25/14 0532  GLUCAP 98 98 105*    Medical History: Past Medical History  Diagnosis Date  . Delirium tremens 09/10/2013  . Dental abscess 09/10/2013  . Knee effusion, left 09/10/2013  . Mental status change 09/10/2013    Insulin Requirements: no insulin used in past 24 hours  Current Nutrition:  - Ate 75% of dinner on 7/20, no oral intake documented since - full liquid diet started on 7/20 --> Advancing to soft diet 7/22 - Ensure Enlive TID - Clinimix E 5/15 at 35 mL/hr - Fat Emulsion 20% at 5 mL/hr  IVF: LR + KCl at 10 mL/hr  Central access: PICC placed 7/14 TPN start date: 7/14  ASSESSMENT                                                                                                           HPI: 84 YOM presented 7/9 with abdominal pain, underwent lap exploration 7/10 for free air and found to have perforated doudenal bulb ulcer s/p patch repair.  He has alcohol cirrhosis with elevated LFTs. Baseline malnutrition noted. Orders to start TPN as patient has been NPO since surgery.   Significant events:  7/10: Surgical repair of perforated DU 7/14: Started TPN 7/17: TPN advanced to goal 7/20: full liquid diet ordered, TPN rate reduced by half to 35 ml/hr  Today:   Glucose - CBGs at goal < 150; w/ no correction needed.  Electrolytes - Na low 129, Cl low 100, other electrolytes WNL (Ca corrects to WNL).  Renal - SCr and BUN WNL  LFTs - AST/ALT trending up; TBili wnl  TGs - 132, 128  Prealbumin - 4.4, 3.7  NUTRITIONAL GOALS                                                                                             RD recs: 1610-9604 Kcals/d, 75-85 grams protein/d Clinimix E 5/15 at a goal rate of 102ml/hr + 20% fat emulsion at 68ml/hr will provide 84g/day protein, 1672Kcal/day.  PLAN                                                                                                                          D/C TPN, lipids and TPN labs.   Junita Push, PharmD, BCPS 12/25/2014 8:34 AM

## 2014-12-25 NOTE — Progress Notes (Signed)
Nutrition Follow-up  DOCUMENTATION CODES:   Non-severe (moderate) malnutrition in context of acute illness/injury  INTERVENTION:  - Continue TPN/TPN wean per pharmacy - Continue to advance diet as medically feasible; plans to advance to Soft diet today - Will order Magic cup TID with meals, each supplement provides 290 kcal and 9 grams of protein  - RD will continue to monitor for needs   NUTRITION DIAGNOSIS:   Inadequate oral intake related to inability to eat as evidenced by NPO status. -slightly resolving with PO diet order  GOAL:   Patient will meet greater than or equal to 90% of their needs -unmet  MONITOR:   PO intake, Supplement acceptance, Weight trends, Labs, I & O's, Other (Comment) (TPN regimen)  ASSESSMENT:   64 year old male with worsening 2 day history of upper abdominal pain. Decreased appetite. Nauseated but not during up. Drinks a fifth of liquor and at least a sixpack of beer a day. Smokes about a pack of cigarettes a day. Has been admitted in the past for alcohol toxicity and delirium tremens. Upper abdominal pain concerning. Ultrasound showed some mild gallbladder wall thickening. No evidence of cholecystitis. Liver function tests all elevated. Concern for cholecystitis. Surgical consultation requested.  7/22 - POD #13 - Diet advanced to FLD 7/20 with documented 75% dinner completion that day - Pt reports poor appetite and not feeling hungry - He states he had cream of potato soup with tea and ice cream for dinner; had not ordered breakfast at time of visit this AM - He indicates he is having upper abdominal pressure after intakes that feels like he needs to burp; states this is not present without intakes - Pt currently receiving Clinimix E 5/15 @ 35 mL/hr with 20% lipids @ 5 mL/hr which is providing 42 grams protein, 836 kcal - Plan per pharmacy today:  Decrease TPN to 52ml/hr x 2 hrs, then D/C.  D/C lipids and TPN labs.   - Not meeting  needs; may benefit from appetite stimulant if appetite continues to remain poor - Medication reviewed. Labs reviewed; CBGs: 96-113 mmol/L, Na: 129 mmol/L, Cl: 100 mmol/L, Ca: 8.3 mg/dL, AST/ALT elevated   1/61 - POD # 10 - Diet advanced to CLD at 0954 today; pt consumed 50% cup of jello and denies abdominal pain or nausea with intakes - TPN initiated 7/14 and continues per pharmacy: Clinimix E 5/15 @ 70 mL/hr with 20% lipids @ 10 mL/hr which is providing 1672 kcal, 84 grams protein which is meeting 97.5% minimal kcal and 100% protein needs - Plan per pharmacy today:  Cont Clinimix E 5/15 at 70 ml/hr  Cont 20% fat emulsion at 73ml/hr  Cont IVF at 10 mL/hr or per MD  TPN to contain standard multivitamins and trace elements.  Tbili now WNL  TPN labs on Mondays and Thursdays. - Medications reviewed. Labs reviewed; CBGs: 94-119 mg/dL, Na: 096 mmol/L, creatinine low, Ca: 8.8 mg/dL, AST/ALT elevated  0/45 - Pt NPO and reports abdominal pain with deep inhalations and feels if he were to consume something he would vomit - POD #1 ex lap with laparoscopic omental patch of perforated ulcer and core liver biopsies - PTA he had poor appetite x1 week  - States stable around UBW of 165 lbs - Weight hx review indicates pt has lost 14 lbs (8% body weight) in unknown time frame from UBW  Diet Order:  Diet - low sodium heart healthy TPN (CLINIMIX-E) Adult DIET SOFT Room service appropriate?: Yes; Fluid consistency::  Thin  Skin:  Wound (see comment) (abdominal incision)  Last BM:  7/22  Height:   Ht Readings from Last 1 Encounters:  12/13/14  (1.88 m)    Weight:   Wt Readings from Last 1 Encounters:  12/13/14 151 lb 7.3 oz (68.7 kg)    Ideal Body Weight:  86.4 kg (kg)  Wt Readings from Last 10 Encounters:  12/13/14 151 lb 7.3 oz (68.7 kg)  09/07/13 148 lb (67.132 kg)    BMI:  Body mass index is 19.44 kg/(m^2).  Estimated Nutritional Needs:   Kcal:  9528-4132  Protein:   75-85 grams  Fluid:  2.2 L/day  EDUCATION NEEDS:   No education needs identified at this time    Alfred [name], RD, LDN Inpatient Clinical Dietitian Pager # [phone] After hours/weekend pager # [phone]

## 2014-12-26 DIAGNOSIS — M25462 Effusion, left knee: Secondary | ICD-10-CM

## 2014-12-26 DIAGNOSIS — B1710 Acute hepatitis C without hepatic coma: Secondary | ICD-10-CM

## 2014-12-26 LAB — BASIC METABOLIC PANEL
Anion gap: 7 (ref 5–15)
BUN: 10 mg/dL (ref 6–20)
CO2: 22 mmol/L (ref 22–32)
Calcium: 8.3 mg/dL — ABNORMAL LOW (ref 8.9–10.3)
Chloride: 101 mmol/L (ref 101–111)
Creatinine, Ser: 0.82 mg/dL (ref 0.61–1.24)
GFR calc Af Amer: >60 mL/min (ref >60)
GFR calc non Af Amer: >60 mL/min (ref >60)
GLUCOSE: 99 mg/dL (ref 65–99)
Potassium: 3.9 mmol/L (ref 3.5–5.1)
Sodium: 130 mmol/L — ABNORMAL LOW (ref 135–145)

## 2014-12-26 LAB — MAGNESIUM: Magnesium: 1.9 mg/dL (ref 1.7–2.4)

## 2014-12-26 LAB — COMPREHENSIVE METABOLIC PANEL
ALBUMIN: 2.6 g/dL — AB (ref 3.5–5.0)
ALK PHOS: 93 U/L (ref 38–126)
ALT: 200 U/L — AB (ref 17–63)
AST: 231 U/L — AB (ref 15–41)
Anion gap: 6 (ref 5–15)
BILIRUBIN TOTAL: 3.1 mg/dL — AB (ref 0.3–1.2)
BUN: 9 mg/dL (ref 6–20)
CO2: 22 mmol/L (ref 22–32)
Calcium: 8.2 mg/dL — ABNORMAL LOW (ref 8.9–10.3)
Chloride: 99 mmol/L — ABNORMAL LOW (ref 101–111)
Creatinine, Ser: 0.78 mg/dL (ref 0.61–1.24)
GFR calc Af Amer: >60 mL/min (ref >60)
Glucose, Bld: 83 mg/dL (ref 65–99)
POTASSIUM: 5.8 mmol/L — AB (ref 3.5–5.1)
Sodium: 127 mmol/L — ABNORMAL LOW (ref 135–145)
Total Protein: 6.1 g/dL — ABNORMAL LOW (ref 6.5–8.1)

## 2014-12-26 LAB — CBC
HCT: 32.8 % — ABNORMAL LOW (ref 39.0–52.0)
HEMOGLOBIN: 11.4 g/dL — AB (ref 13.0–17.0)
MCH: 30.3 pg (ref 26.0–34.0)
MCHC: 34.8 g/dL (ref 30.0–36.0)
MCV: 87.2 fL (ref 78.0–100.0)
Platelets: 383 10*3/uL (ref 150–400)
RBC: 3.76 MIL/uL — ABNORMAL LOW (ref 4.22–5.81)
RDW: 17.1 % — AB (ref 11.5–15.5)
WBC: 10.2 10*3/uL (ref 4.0–10.5)

## 2014-12-26 LAB — GLUCOSE, CAPILLARY: Glucose-Capillary: 91 mg/dL (ref 65–99)

## 2014-12-26 MED ORDER — METHOCARBAMOL 500 MG PO TABS: 1000.0000 mg | ORAL_TABLET | Freq: Four times a day (QID) | ORAL | Status: DC | PRN

## 2014-12-26 MED ORDER — CLONIDINE HCL 0.2 MG/24HR TD PTWK: 0.2000 mg | MEDICATED_PATCH | TRANSDERMAL | Status: DC

## 2014-12-26 MED ORDER — PANTOPRAZOLE SODIUM 40 MG PO TBEC: 40.0000 mg | DELAYED_RELEASE_TABLET | Freq: Two times a day (BID) | ORAL | Status: AC

## 2014-12-26 MED ORDER — METOPROLOL TARTRATE 25 MG PO TABS
25.0000 mg | ORAL_TABLET | Freq: Two times a day (BID) | ORAL | Status: DC
  Administered 2014-12-26: 25 mg via ORAL
  Filled 2014-12-26 (×2): qty 1

## 2014-12-26 MED ORDER — METOPROLOL TARTRATE 25 MG PO TABS: 25.0000 mg | ORAL_TABLET | Freq: Two times a day (BID) | ORAL | Status: DC

## 2014-12-26 MED ORDER — PANTOPRAZOLE SODIUM 40 MG PO TBEC
40.0000 mg | DELAYED_RELEASE_TABLET | Freq: Two times a day (BID) | ORAL | Status: DC
  Administered 2014-12-26: 40 mg via ORAL
  Filled 2014-12-26: qty 1

## 2014-12-26 MED ORDER — SODIUM POLYSTYRENE SULFONATE 15 GM/60ML PO SUSP
30.0000 g | Freq: Once | ORAL | Status: AC
  Administered 2014-12-26: 30 g via ORAL
  Filled 2014-12-26: qty 120

## 2014-12-26 NOTE — Progress Notes (Signed)
Patient accidentally removed PICC line, applied pressure to site to stop bleeding. Applied tape and gauze. No signs of complications. Paged physician and IV team to assess patient. Will continue to monitor.

## 2014-12-26 NOTE — Progress Notes (Signed)
14 Days Post-Op  Subjective: Feels good  Objective: Vital signs in last 24 hours: Temp:  [98.2 F (36.8 C)-98.8 F (37.1 C)] 98.8 F (37.1 C) (07/23 0538) Pulse Rate:  [70-81] 81 (07/23 0538) Resp:  [17-20] 17 (07/23 0538) BP: (124-133)/(84-90) 124/90 mmHg (07/23 0538) SpO2:  [96 %-99 %] 96 % (07/23 0538) Last BM Date: 12/25/14  Intake/Output from previous day: 07/22 0701 - 07/23 0700 In: 10 [I.V.:10] Out: 1075 [Urine:1075] Intake/Output this shift:    PE: General- In NAD Abdomen-soft, wounds clean and intact  Lab Results:   Recent Labs  12/25/14 0441 12/26/14 0545  WBC 11.3* 10.2  HGB 11.2* 11.4*  HCT 32.3* 32.8*  PLT 326 383   BMET  Recent Labs  12/25/14 0441 12/26/14 0545  NA 129* 127*  K 4.1 5.8*  CL 100* 99*  CO2 24 22  GLUCOSE 101* 83  BUN 9 9  CREATININE 0.68 0.78  CALCIUM 8.3* 8.2*   PT/INR No results for input(s): LABPROT, INR in the last 72 hours. Comprehensive Metabolic Panel:    Component Value Date/Time   NA 127* 12/26/2014 0545   NA 129* 12/25/2014 0441   K 5.8* 12/26/2014 0545   K 4.1 12/25/2014 0441   CL 99* 12/26/2014 0545   CL 100* 12/25/2014 0441   CO2 22 12/26/2014 0545   CO2 24 12/25/2014 0441   BUN 9 12/26/2014 0545   BUN 9 12/25/2014 0441   CREATININE 0.78 12/26/2014 0545   CREATININE 0.68 12/25/2014 0441   GLUCOSE 83 12/26/2014 0545   GLUCOSE 101* 12/25/2014 0441   CALCIUM 8.2* 12/26/2014 0545   CALCIUM 8.3* 12/25/2014 0441   AST 231* 12/26/2014 0545   AST 212* 12/25/2014 0441   ALT 200* 12/26/2014 0545   ALT 194* 12/25/2014 0441   ALKPHOS 93 12/26/2014 0545   ALKPHOS 89 12/25/2014 0441   BILITOT 3.1* 12/26/2014 0545   BILITOT 1.7* 12/25/2014 0441   PROT 6.1* 12/26/2014 0545   PROT 6.2* 12/25/2014 0441   ALBUMIN 2.6* 12/26/2014 0545   ALBUMIN 2.5* 12/25/2014 0441     Studies/Results: No results found.  Anti-infectives: Anti-infectives    Start     Dose/Rate Route Frequency Ordered Stop   12/13/14  1000  piperacillin-tazobactam (ZOSYN) IVPB 3.375 g     3.375 g 12.5 mL/hr over 240 Minutes Intravenous Every 8 hours 12/13/14 0940 12/20/14 0604   12/12/14 2245  clindamycin (CLEOCIN) 900 mg, gentamicin (GARAMYCIN) 240 mg in sodium chloride 0.9 % 1,000 mL for intraperitoneal lavage  Status:  Discontinued    Comments:  Pharmacy may adjust dosing strength, schedule, rate of infusion, etc as needed to optimize therapy    Intraperitoneal To Surgery 12/12/14 2238 12/13/14 0205   12/12/14 2030  piperacillin-tazobactam (ZOSYN) IVPB 3.375 g     3.375 g 100 mL/hr over 30 Minutes Intravenous  Once 12/12/14 2020 12/12/14 2140      Assessment Perforated duodenal bulb ulcer  (risk factors = NSAIDS, smoking) POD #13 laparoscopic omental patch and core liver biopsy (Dr. Michaell Cowing )-progressing well now.   LOS: 13 days   Plan: Okay for discharge from our standpoint.  Follow up with Dr. Michaell Cowing in 3-4 weeks.  Recommend BID PPI for at least 6 weeks then EGD to document healing.   Lucyle Alumbaugh Shela Commons 12/26/2014

## 2014-12-26 NOTE — Progress Notes (Signed)
Report received from T. Nicoles, RN. No change from initial pm assessment. Will continue to monitor and follow the POC.  

## 2014-12-26 NOTE — Care Management Note (Signed)
Case Management Note  Patient Details  Name: SHEILA GERVASI MRN: [id number] Date of Birth: 07/22/1950  Subjective/Objective:      Perforated duodenal bulb ulcer s/p lap omental patch 12/13/2014              Action/Plan: Home   Expected Discharge Date:  12/26/2014              Expected Discharge Plan:  Home/Self Care  In-House Referral:  Clinical Social Work  Discharge planning Services  CM Consult  Post Acute Care Choice:    Choice offered to:     DME Arranged:  [facility] rolling DME Agency:  [facility]  HH Arranged:    HH Agency:     Status of Service:  Completed, signed off  Medicare Important Message Given:    Date Medicare IM Given:    Medicare IM give by:    Date Additional Medicare IM Given:    Additional Medicare Important Message give by:     If discussed at Long Length of Stay Meetings, dates discussed:    Additional Comments: Consulted for Home Health and DME. NCM contacted [facility] DME rep for RW for home. Spoke to about HHPT not covered by insurance, does not have qualifying diagnosis for coverage.  Elliot Cousin, RN 12/26/2014, 4:17 PM

## 2014-12-26 NOTE — Progress Notes (Signed)
Pt. Discharge teaching completed by day shift [facility]. Pt. Discharged to home. Left via wheelchair by NT with family member at side. No shortness of breath noted.

## 2014-12-26 NOTE — Discharge Summary (Signed)
Physician Discharge Summary  Alfred [name]:[id number] DOB: 1951-05-25 DOA: 12/12/2014  PCP: No PCP Per Patient  Admit date: 12/12/2014 Discharge date: 12/26/2014  Time spent: 20 minutes  Recommendations for Outpatient Follow-up:  1. Establish and follow up with PCP as scheduled 2. Please recheck Comprehensive metabolic profile within one week 3. Please ensure patient has GI follow up as outpatient to follow liver disease and for screening colonoscopy  Discharge Diagnoses:  Principal Problem:   Perforated duodenal bulb ulcer s/p lap omental patch 12/13/2014 Active Problems:   Protein-calorie malnutrition, moderate   Alcohol abuse   Cirrhosis, alcoholic   Tobacco abuse   Colonoscopy refused   Inguinal hernia, right   Family history of colon cancer (father)   Effusion of left knee   Hepatitis C infection   Elevated blood pressure   Abnormal LFTs   Discharge Condition: Improved  Diet recommendation: soft, advance as tolerated  Filed Weights   12/13/14 0215  Weight: 68.7 kg (151 lb 7.3 oz)    History of present illness:  The patient is a 64 year old male with history of alcohol and tobacco abuse who presented with a 2 day history of upper abdominal pain, nausea, anorexia. He had severe aching right upper quadrant pain that became progressively worse and therefore he came to the hospital. CT scan demonstrated moderate to large amount of free air and free fluid within the abdomen and pelvis tracking along the paracolic gutters consistent with bowel perforation. He also had some soft tissue inflammation around the gallbladder and his liver function tests were elevated concerning for possible cholecystitis. He was found to have a moderate to large right inguinal hernia and also some focal wall thickening of the antrum of the stomach which appeared stable from 2015. He was admitted by general surgery and underwent exploratory laparoscopy. He was found to have a perforated duodenal  ulcer and had laparoscopic omental patch of his perforated ulcer as well as core liver biopsies. His postoperative course has been complicated by DTs. His liver biopsy demonstrated early cirrhosis and hepatic steatosis consistent with alcohol abuse. Internal medicine has been consulted secondary to the finding of cirrhosis and the need for follow-up.  Hospital Course:  Perforated duodenal ulcer  - status post laparoscopic omental patch on 12/13/2014 with persistent leak - H. Pylori was negative  - Counseled on EtOH and NSAID avoidance - Continue BID PPI at discharge for at least 60 days - Pt is to f/u with GI scheduled with Dr. Randa Evens - General surgery followed - No obvious leak on upper GI  - Patient was initially TPN dependent, but later successfully tolerated soft diet  Large left knee effusion due to loose body and arthritis, - improved - Orthopedics consult (last seen by Dr. Charlann Boxer 09/2013) for aspiration and possible injection - Continued Diclofenac gel while inpatient  Leukocytosis - UA wasneg - CXR with kerley B lines c/w interstitial edema, no obvious pneumonia >> d/c LR, but will hold off on lasix - Continued Incentive Spirometry as tolerated  Alcoholic cirrhosis  - With liver biopsy performed on 12/13/2014. coags and plt wnl.  - Would cont to avoid tylenol and tylenol containing medications - Counseled EtOH cessation and added cirrhosis education to discharge paperwork - LFT's had recently been rising. Abd [place] without significant change. Suspecting elevated LFT's secondary to TPN vs hcv and alcoholic liver disease - TPN has been stopped per Surgery - Discussed case with GI prior to d/c, who recommended close outpatient follow up. Again, GI  f/u as per above  HCV infection - Hep A and B neg, given 1st vaccine in series on 7/14 - RNA quant [id number] (log 5.792) and genotype 1a - LFTs trending upwards - Abd US findings noted as per above - Will benefit from  f/u with GI as outpatient  Alcohol withdrawal,  - resolved.  - Continue thiamine, folate.  - SW to provide resources this coming week. - Patient referred to [facility]  Delirium, likely secondary to EtOH w/d and acute illness and resolved  Elevated BP   - Scheduled metoprolol  - Continue prn metoprolol - Increased clonidine patch to 0.2mg  on 7/18 - BP seems improved  Hypokalemia,  - resolved with IV supplementation.  - Magnesium within normal limits  Hypoglycemia,  - resolved.  Normocytic anemia, hgb stable - Iron studies c/w possible iron deficiency although ferritin somewhat elevated (chronic disease), B12 1086, folate 7.1 - TSH was 2.192 - Given feraheme on 7/15 x 1  Mild thrombocytopenia,  - acute phase reactant and resolved   Mildly prolonged QTc - avoid QTc prolonging medications - magnesium wnl  Moderate protein calorie malnutrition - TPN, weaning currently - PO as tolerated  Procedures: 7/17: Left knee aspiration by orthopedic surgery 7/18: Upper GI series: Adynamic ileus and no obvious leak but imperfect study  Consultations:  General Surgery  GI  Discharge Exam: Filed Vitals:   12/25/14 0400 12/25/14 1444 12/25/14 2023 12/26/14 0538  BP: 140/84 133/84 129/86 124/90  Pulse: 76 70 77 81  Temp: 99.2 F (37.3 C) 98.2 F (36.8 C) 98.6 F (37 C) 98.8 F (37.1 C)  TempSrc: Oral Oral Oral Oral  Resp: 20 18 20 17   Height:      Weight:      SpO2: 97% 99% 98% 96%    General: awake, in nad Cardiovascular: regular, s1, s2 Respiratory: normal resp effort, no wheezing  Discharge Instructions      Discharge Instructions    Call MD for:  extreme fatigue    Complete by:  As directed      Call MD for:  hives    Complete by:  As directed      Call MD for:  persistant nausea and vomiting    Complete by:  As directed      Call MD for:  redness, tenderness, or signs of infection (pain, swelling, redness, odor or green/yellow  discharge around incision site)    Complete by:  As directed      Call MD for:  severe uncontrolled pain    Complete by:  As directed      Call MD for:    Complete by:  As directed   Temperature > 101.35F     Diet - low sodium heart healthy    Complete by:  As directed      Discharge instructions    Complete by:  As directed   Please see discharge instruction sheets.  Also refer to handout given an office.  Please call our office if you have any questions or concerns [phone]     Discharge wound care:    Complete by:  As directed   If you have closed incisions, shower and bathe over these incisions with soap and water every day.  Remove all surgical dressings on postoperative day #3.  You do not need to replace dressings over the closed incisions unless you feel more comfortable with a Band-Aid covering it.   If you have an open wound that requires  packing, please see wound care instructions.  In general, remove all dressings, wash wound with soap and water and then replace with saline moistened gauze.  Do the dressing change at least every day.  Please call our office [phone] if you have further questions.     Driving Restrictions    Complete by:  As directed   No driving until off narcotics and can safely swerve away without pain during an emergency     Increase activity slowly    Complete by:  As directed   Walk an hour a day.  Use 20-30 minute walks.  When you can walk 30 minutes without difficulty, it is fine to restart low impact/moderate activities such as biking, jogging, swimming, sexual activity, etc.  Eventually you can increase to unrestricted activity when not feeling pain.  If you feel pain: STOP!Marland Kitchen   Let pain protect you from overdoing it.  Use ice/heat & over-the-counter pain medications to help minimize soreness.  If that is not enough, then use your narcotic pain prescription as needed to remain active.  It is better to take extra pain medications and be more active  than to stay bedridden to avoid all pain medications.     Lifting restrictions    Complete by:  As directed   Avoid heavy lifting initially.  Do not push through pain.  You have no specific weight limit - if it hurts to do, DON'T DO IT.   If you feel no pain, you are not injuring anything.  Pain will protect you from injury.  Coughing and sneezing are far more stressful to your incision than any lifting.  Avoid resuming heavy lifting / intense activity until off all narcotic pain medications.  When ready to exercise more, give yourself 2 weeks to gradually get back to full intense exercise/activity.     May shower / Bathe    Complete by:  As directed      May walk up steps    Complete by:  As directed      Sexual Activity Restrictions    Complete by:  As directed   Sexual activity as tolerated.  Do not push through pain.  Pain will protect you from injury.     Walk with assistance    Complete by:  As directed   Walk over an hour a day.  May use a walker/cane/companion to help with balance and stamina.            Medication List    TAKE these medications        cloNIDine 0.2 mg/24hr patch  Commonly known as:  CATAPRES - Dosed in mg/24 hr  Place 1 patch (0.2 mg total) onto the skin once a week.  Start taking on:  12/27/2014     methocarbamol 500 MG tablet  Commonly known as:  ROBAXIN  Take 2 tablets (1,000 mg total) by mouth every 6 (six) hours as needed for muscle spasms.     metoprolol tartrate 25 MG tablet  Commonly known as:  LOPRESSOR  Take 1 tablet (25 mg total) by mouth 2 (two) times daily.     pantoprazole 40 MG tablet  Commonly known as:  PROTONIX  Take 1 tablet (40 mg total) by mouth 2 (two) times daily.     thiamine 100 MG tablet  Take 1 tablet (100 mg total) by mouth daily.       Allergies  Allergen Reactions  . Nsaids Other (See Comments)    Perforated  ulcer   Follow-up Information    Follow up with Vertell Novak, MD On 01/04/2015.   Specialty:   Gastroenterology   Why:  1:15PM   Contact information:   [address] [place] [address] [phone]       Follow up with  [facility]     In 1 week.   Contact information:   [address] [place] [address] [phone]      Follow up with [facility]   Why:  Appointment scheduled for Tuesday (12/29/14) at 8am. Please bring a photo ID, Social Security Card and [place] card to appointment.   Contact information:   [address] [place], [place] [address] [phone]       The results of significant diagnostics from this hospitalization (including imaging, microbiology, ancillary and laboratory) are listed below for reference.    Significant Diagnostic Studies: Dg Abd 1 View  12/22/2014   CLINICAL DATA: Persistent abdominal distention and pain  EXAM: ABDOMEN - 1 VIEW  COMPARISON:  12/21/2014  FINDINGS: Stable small-bowel distention is noted although contrast from the recent exam as passed from the stomach and into the colon and is predominantly on. No free air is seen. No mass lesion is noted.  IMPRESSION: Stable small bowel dilatation likely related to ileus. Contrast material as passed into the colon from the recent upper GI study.   Electronically Signed   By: Alcide Clever M.D.   On: 12/22/2014 08:37   Dg Abd 1 View  12/16/2014   CLINICAL DATA:  NG tube placement  EXAM: ABDOMEN - 1 VIEW  COMPARISON:  12/15/2014  FINDINGS: Enteric tube tip is in the left upper quadrant consistent with location in the upper stomach. Probable small left pleural effusion. Atelectasis in the lung bases.  IMPRESSION: Enteric tube tip in the left upper quadrant consistent with location in the upper stomach.   Electronically Signed   By: Burman Nieves M.D.   On: 12/16/2014 06:25   Ct Abdomen Pelvis W Contrast  12/12/2014   CLINICAL DATA:  Acute onset of generalized abdominal pain. Rebound tenderness at the right upper quadrant.  Initial encounter.  EXAM: CT ABDOMEN AND PELVIS WITH CONTRAST  TECHNIQUE: Multidetector CT imaging of the abdomen and pelvis was performed using the standard protocol following bolus administration of intravenous contrast.  CONTRAST:  OMNIPAQUE IOHEXOL 300 MG/ML  SOLN  COMPARISON:  CT of the abdomen and pelvis performed 09/04/2013, and right upper quadrant ultrasound performed earlier today at 5:15 p.m.  FINDINGS: The visualized lung bases are clear.  A moderate to large amount of free air and free fluid are noted within the abdomen and pelvis. Soft tissue inflammation is noted tracking along both paracolic gutters. This is compatible with underlying bowel perforation. The location of perforation is not well characterized on this study.  Fluid is noted tracking about the gallbladder, with surrounding soft tissue inflammation. This could reflect mild cholecystitis, or could be reactive secondary to underlying bowel perforation and free fluid.  The liver and spleen are unremarkable in appearance. Scattered air is noted tracking about the liver. The pancreas and adrenal glands are unremarkable.  The kidneys are unremarkable in appearance. There is no evidence of hydronephrosis. No renal or ureteral stones are seen. No perinephric stranding is appreciated. Minimal calcification is noted at the distal abdominal aorta and its branches.  A moderate to large right inguinal hernia is seen, containing a long segment of  distal ileum. No associated soft tissue inflammation is seen to suggest strangulation. Trace free air is noted within the hernia.  There is focal wall thickening at the antrum of the stomach. This appears grossly stable from 2015 and may reflect chronic inflammation. No acute vascular abnormalities are seen.  The appendix is not well seen; there is no evidence of appendicitis. The colon is largely decompressed and is grossly unremarkable in appearance.  The bladder is mildly distended and grossly  unremarkable. The prostate is normal in size. No inguinal lymphadenopathy is seen.  No acute osseous abnormalities are identified.  IMPRESSION: 1. Moderate to large amount of free air and free fluid within the abdomen and pelvis, tracking along the paracolic gutters. This is compatible with underlying bowel perforation. The location of perforation is not well characterized on this study. 2. Fluid tracking about the gallbladder, with surrounding soft tissue inflammation. This could reflect mild cholecystitis, or could be reactive secondary to underlying bowel perforation and free fluid. 3. Moderate to large right inguinal hernia, containing a long segment of distal ileum. No associated soft tissue inflammation seen to suggest strangulation. Trace free air noted within the hernia. 4. Focal wall thickening at the antrum of the stomach appears grossly stable from 2015 and may reflect chronic inflammation. Would correlate with the patient's symptoms; endoscopy could be considered on an elective nonemergent basis.  Critical Value/emergent results were called by telephone at the time of interpretation on 12/12/2014 at 9:24 pm to Dr. Toy Cookey, who verbally acknowledged these results.   Electronically Signed   By: Roanna Raider M.D.   On: 12/12/2014 21:57   Dg Chest Port 1 View  12/21/2014   CLINICAL DATA:  Leukocytosis.  EXAM: PORTABLE CHEST - 1 VIEW  COMPARISON:  December 15, 2014.  FINDINGS: The heart size and mediastinal contours are within normal limits. No pneumothorax is noted. Interval placement of right-sided PICC line is noted with distal tip overlying expected position of SVC. Increased interstitial densities are noted throughout both lungs most consistent with pulmonary edema or less likely atypical infection. No significant pleural effusion is noted. The visualized skeletal structures are unremarkable.  IMPRESSION: Increased bilateral interstitial densities are noted throughout both lungs most consistent  with pulmonary edema or less likely interstitial pneumonitis. Interval placement of right-sided PICC line with distal tip overlying expected position of the SVC.   Electronically Signed   By: Lupita Raider, M.D.   On: 12/21/2014 08:09   Dg Abd Acute W/chest  12/15/2014   CLINICAL DATA:  Assess nasogastric tube positioning. The patient experienced coughing duringNG tube irrigation.  EXAM: DG ABDOMEN ACUTE W/ 1V CHEST  COMPARISON:  Chest x-ray of December 12, 2014  FINDINGS: The in nasogastric tube tip projects in the region of the proximal gastric fundus. The proximal port lies just below the expected location of the GE junction.  There is bibasilar atelectasis or infiltrate. There is a small left pleural effusion and trace right pleural effusion. The heart and pulmonary vascularity are normal.  IMPRESSION: 1. The nasogastric tube positioning is reasonable though advancement by an additional 10 cm would assure that the proximal port remains below the GE junction. 2. There is bibasilar atelectasis and small bilateral pleural effusions greater on the left than on the right.   Electronically Signed   By: David  [place] M.D.   On: 12/15/2014 09:06   Dg Abd Acute W/chest  12/12/2014   CLINICAL DATA:  Two day history of abdominal pain increasing in  severity over the past 2 hr.  EXAM: DG ABDOMEN ACUTE W/ 1V CHEST  COMPARISON:  Chest x-ray 09/04/2013  FINDINGS: The upright chest x-ray demonstrates normal cardiomediastinal contours. No acute pulmonary findings. Minimal streaky basilar atelectasis. There is moderate free air noted in the abdomen.  Two views of the abdomen confirm large amount of free intraperitoneal air consistent with hollow viscus perforation. The soft tissue shadows of the abdomen are maintained. No worrisome calcifications. The bony structures are intact.  IMPRESSION: Free intraperitoneal air is noted, consistent with hollow viscus perforation.  These results were called by telephone at the time of  interpretation on 12/12/2014 at 8:16 pm to Dr. Toy Cookey , who verbally acknowledged these results.   Electronically Signed   By: Rudie Meyer M.D.   On: 12/12/2014 20:16   Dg Ugi W/water Sol Cm  12/21/2014   CLINICAL DATA:  Status post repair of perforated duodenal ulcer. Contained leak from duodenum identified on previous upper GI series.  EXAM: WATER SOLUBLE UPPER GI SERIES  TECHNIQUE: Single-column upper GI series was performed using water soluble contrast.  CONTRAST:  OMNIPAQUE IOHEXOL 300 MG/ML  SOLN  COMPARISON:  12/16/2014.  FLUOROSCOPY TIME:  Radiation Exposure Index (as provided by the fluoroscopic device):  If the device does not provide the exposure index:  Fluoroscopy Time (in minutes and seconds):  5 minutes and 7 seconds.  Number of Acquired Images:  FINDINGS: Pre-procedure KUB shows gaseous small bowel distention, likely related to ileus.  Patient was given water-soluble contrast material by mouth. Initial gastric emptying into the duodenum does not reveal contrast protruding into a small extraluminal pocket is seen on the previous study. There may be a tiny nipple like projection from the proximal duodenum, in the location of the collection previously, but this does not opacify an extraluminal pocket. This appearance may simply be related to mucosa or slight mucosal irregularity from previous surgery.  Patient was given additional water-soluble contrast material and put in multiple positions to try to promote better gastric emptying and re- stress the area of apparent leak on the previous study. However, after the initial episode of gastric emptying, there was very little contrast migration from the stomach into the duodenum. Under fluoroscopy, it was clear that very little duodenal or small bowel peristalsis was evident than that small bowel loops of the abdomen are gas-filled and distended, compatible with underlying ileus.  IMPRESSION: No evidence for contrast extravasation from the  first portion of the duodenum on today's study. However, only 1 episode of gastric emptying was visualized on the current study and after the first bolus of contrast passed through the duodenum, very little contrast material migrated out of the stomach subsequently, despite nearly 11 minutes of observation. While no contrast extravasation is visualized from the duodenum today, study is somewhat limited by the lack of gastric emptying and small bowel peristalsis, both compatible with underlying adynamic ileus.  I discussed these findings by telephone with Dr. Johna Sheriff at approximately 12:50 p.m. on 12/21/2014.   Electronically Signed   By: Kennith Center M.D.   On: 12/21/2014 13:16   Dg Kayleen Memos W/water Sol Cm  12/16/2014   CLINICAL DATA:  Patient status post surgical repair of perforated duodenum ulcer.  EXAM: WATER SOLUBLE UPPER GI SERIES  TECHNIQUE: Single-column upper GI series was performed using water soluble contrast.  CONTRAST:  200 mL Omnipaque  COMPARISON:  CT 12/12/2014  FLUOROSCOPY TIME:  Radiation Exposure Index (as provided by the fluoroscopic device):  If  the device does not provide the exposure index:  Fluoroscopy Time (in minutes and seconds):  2 minutes 35 seconds  Number of Acquired Images:  16  FINDINGS: Oral contrast readily flows through the esophagus into the stomach. Contrast readily flowed out to the duodenum bulb and C-loop of the duodenum and ultimately the proximal small bowel.  Along the proximal portion of the second portion duodenum just distal to the duodenum bulb there is a thin stream of contrast which connects to a approximately 2 cm x 1 cm collection of contrast. This small pocket of contrast has a stalk and cap appearance. No contrast extends beyond this small extraluminal pouch adjacent to the first portion the duodenum.  IMPRESSION: 1. Small pocket of extraluminal contrast connected to the first portion duodenum via a thin channel presumably represents a small contained leak from  the omental patch. 2. No evidence of obstruction or leak otherwise. Findings conveyed toDr. Leretha Dykes 12/16/2014  at11:26.   Electronically Signed   By: Genevive Bi M.D.   On: 12/16/2014 11:27   US Abdomen Limited Ruq  12/23/2014   CLINICAL DATA:  Elevated LFTs, history cirrhosis, hepatitis-C  EXAM: US ABDOMEN LIMITED - RIGHT UPPER QUADRANT  COMPARISON:  CT abdomen and pelvis 12/12/2014  FINDINGS: Gallbladder:  Incompletely distended. Wall appears thickened at 5 mm thick, nonspecific in the setting of ascites. No gallstones or sonographic Murphy sign. Minimal pericholecystic fluid  Common bile duct:  Diameter: 3 mm diameter , normal  Liver:  Echogenic and slightly nodular consistent with cirrhosis. No focal hepatic mass identified. Hepatopetal portal venous flow.  Minimal ascites perihepatic.  IMPRESSION: Cirrhotic appearing liver with minimal perihepatic ascites.  Thickened gallbladder wall which is nonspecific in the setting of ascites.  Otherwise negative exam.   Electronically Signed   By: Ulyses Southward M.D.   On: 12/23/2014 16:59   US Abdomen Limited Ruq  12/12/2014   CLINICAL DATA:  Acute onset of right upper quadrant abdominal pain. Initial encounter.  EXAM: US ABDOMEN LIMITED - RIGHT UPPER QUADRANT  COMPARISON:  CT of the abdomen and pelvis performed 09/04/2013  FINDINGS: Gallbladder:  Mild gallbladder wall thickening is noted. This may reflect mild chronic inflammation. No stones are identified. No pericholecystic fluid is seen. No ultrasonographic Murphy's sign is elicited.  Common bile duct:  Diameter: 0.3 cm, within normal limits in caliber.  Liver:  No focal lesion identified. Diffusely nodular contour raises concern for hepatic cirrhosis, better characterized than on the prior study. Mildly increased parenchymal echogenicity could reflect mild fatty infiltration.  IMPRESSION: 1. Mild gallbladder wall thickening may reflect mild chronic inflammation. No stones seen. No evidence for obstruction or  acute cholecystitis. 2. Diffusely nodular hepatic contour raises concern for hepatic cirrhosis, better characterized than on the prior study. Mild fatty infiltration within the liver.   Electronically Signed   By: Roanna Raider M.D.   On: 12/12/2014 18:23    Microbiology: Recent Results (from the past 240 hour(s))  Clostridium Difficile by PCR (not at [facility])     Status: None   Collection Time: 12/16/14  5:38 PM  Result Value Ref Range Status   C difficile by pcr NEGATIVE NEGATIVE Final  Culture, Urine     Status: None   Collection Time: 12/21/14  8:29 AM  Result Value Ref Range Status   Specimen Description URINE, RANDOM  Final   Special Requests NONE  Final   Culture   Final    NO GROWTH 1 DAY Performed at [facility]  [facility]    Report Status 12/22/2014 FINAL  Final     Labs: Basic Metabolic Panel:  Recent Labs Lab 12/20/14 0430 12/21/14 0500 12/22/14 0500 12/23/14 0525 12/24/14 0425 12/25/14 0441 12/26/14 0545 12/26/14 1228  NA 133* 132* 132* 131* 129* 129* 127* 130*  K 3.4* 3.7 3.8 3.8 4.1 4.1 5.8* 3.9  CL 102 102 105 102 101 100* 99* 101  CO2 26 23 22 22 22 24 22 22   GLUCOSE 104* 133* 105* 104* 94 101* 83 99  BUN 8 9 12 12 10 9 9 10   CREATININE 0.72 0.67 0.59* 0.62 0.78 0.68 0.78 0.82  CALCIUM 8.3* 8.6* 8.8* 8.8* 8.5* 8.3* 8.2* 8.3*  MG 1.9 1.7 1.9 1.8 1.7 1.7 1.9  --   PHOS 3.0 3.4  --   --  3.4  --   --   --    Liver Function Tests:  Recent Labs Lab 12/22/14 0500 12/23/14 0525 12/24/14 0425 12/25/14 0441 12/26/14 0545  AST 131* 190* 222* 212* 231*  ALT 93* 131* 173* 194* 200*  ALKPHOS 67 79 84 89 93  BILITOT 0.9 1.0 1.0 1.7* 3.1*  PROT 6.2* 6.1* 6.1* 6.2* 6.1*  ALBUMIN 2.4* 2.3* 2.3* 2.5* 2.6*   No results for input(s): LIPASE, AMYLASE in the last 168 hours. No results for input(s): AMMONIA in the last 168 hours. CBC:  Recent Labs Lab 12/21/14 0500 12/22/14 0500 12/23/14 0525 12/24/14 0425 12/25/14 0441 12/26/14 0545  WBC 11.0* 9.3 9.6  9.2 11.3* 10.2  NEUTROABS 8.2*  --   --   --   --   --   HGB 11.0* 10.3* 10.7* 10.6* 11.2* 11.4*  HCT 32.0* 30.0* 31.3* 31.1* 32.3* 32.8*  MCV 88.4 88.0 88.4 87.4 87.5 87.2  PLT 302 289 320 317 326 383   Cardiac Enzymes: No results for input(s): CKTOTAL, CKMB, CKMBINDEX, TROPONINI in the last 168 hours. BNP: BNP (last 3 results) No results for input(s): BNP in the last 8760 hours.  ProBNP (last 3 results) No results for input(s): PROBNP in the last 8760 hours.  CBG:  Recent Labs Lab 12/24/14 2206 12/25/14 0532 12/25/14 1108 12/25/14 2149 12/26/14 0603  GLUCAP 98 105* 113* 83 91     Signed:  Erleen Egner K  [facility] 12/26/2014, 1:52 PM

## 2014-12-27 LAB — OSMOLALITY: Osmolality: 274 mOsm/kg — ABNORMAL LOW (ref 275–300)

## 2016-11-21 ENCOUNTER — Emergency Department (HOSPITAL_COMMUNITY): Payer: Medicaid Other

## 2016-11-21 ENCOUNTER — Encounter (HOSPITAL_COMMUNITY): Payer: Self-pay | Admitting: Emergency Medicine

## 2016-11-21 ENCOUNTER — Inpatient Hospital Stay (HOSPITAL_COMMUNITY): Payer: Medicaid Other

## 2016-11-21 ENCOUNTER — Inpatient Hospital Stay (HOSPITAL_COMMUNITY)
Admission: EM | Admit: 2016-11-21 | Discharge: 2016-11-30 | DRG: 393 | Disposition: A | Discharge status: Home / Self Care | Payer: Medicaid Other | Attending: Internal Medicine | Admitting: Internal Medicine

## 2016-11-21 DIAGNOSIS — E871 Hypo-osmolality and hyponatremia: Secondary | ICD-10-CM | POA: Diagnosis present

## 2016-11-21 DIAGNOSIS — I472 Ventricular tachycardia: Secondary | ICD-10-CM | POA: Diagnosis present

## 2016-11-21 DIAGNOSIS — E872 Acidosis, unspecified: Secondary | ICD-10-CM | POA: Diagnosis present

## 2016-11-21 DIAGNOSIS — D509 Iron deficiency anemia, unspecified: Secondary | ICD-10-CM | POA: Diagnosis not present

## 2016-11-21 DIAGNOSIS — R188 Other ascites: Secondary | ICD-10-CM

## 2016-11-21 DIAGNOSIS — K529 Noninfective gastroenteritis and colitis, unspecified: Secondary | ICD-10-CM | POA: Diagnosis present

## 2016-11-21 DIAGNOSIS — D696 Thrombocytopenia, unspecified: Secondary | ICD-10-CM | POA: Diagnosis present

## 2016-11-21 DIAGNOSIS — D689 Coagulation defect, unspecified: Secondary | ICD-10-CM | POA: Diagnosis present

## 2016-11-21 DIAGNOSIS — F1010 Alcohol abuse, uncomplicated: Secondary | ICD-10-CM | POA: Diagnosis not present

## 2016-11-21 DIAGNOSIS — G9341 Metabolic encephalopathy: Secondary | ICD-10-CM | POA: Diagnosis present

## 2016-11-21 DIAGNOSIS — K8020 Calculus of gallbladder without cholecystitis without obstruction: Secondary | ICD-10-CM | POA: Diagnosis not present

## 2016-11-21 DIAGNOSIS — K921 Melena: Secondary | ICD-10-CM

## 2016-11-21 DIAGNOSIS — K4090 Unilateral inguinal hernia, without obstruction or gangrene, not specified as recurrent: Secondary | ICD-10-CM | POA: Diagnosis not present

## 2016-11-21 DIAGNOSIS — R945 Abnormal results of liver function studies: Secondary | ICD-10-CM

## 2016-11-21 DIAGNOSIS — Z6820 Body mass index (BMI) 20.0-20.9, adult: Secondary | ICD-10-CM

## 2016-11-21 DIAGNOSIS — I959 Hypotension, unspecified: Secondary | ICD-10-CM | POA: Diagnosis present

## 2016-11-21 DIAGNOSIS — E876 Hypokalemia: Secondary | ICD-10-CM | POA: Diagnosis present

## 2016-11-21 DIAGNOSIS — D638 Anemia in other chronic diseases classified elsewhere: Secondary | ICD-10-CM | POA: Diagnosis present

## 2016-11-21 DIAGNOSIS — E43 Unspecified severe protein-calorie malnutrition: Secondary | ICD-10-CM | POA: Diagnosis present

## 2016-11-21 DIAGNOSIS — R140 Abdominal distension (gaseous): Secondary | ICD-10-CM

## 2016-11-21 DIAGNOSIS — R7989 Other specified abnormal findings of blood chemistry: Secondary | ICD-10-CM | POA: Diagnosis present

## 2016-11-21 DIAGNOSIS — R339 Retention of urine, unspecified: Secondary | ICD-10-CM | POA: Diagnosis present

## 2016-11-21 DIAGNOSIS — K7031 Alcoholic cirrhosis of liver with ascites: Secondary | ICD-10-CM | POA: Diagnosis not present

## 2016-11-21 DIAGNOSIS — B1920 Unspecified viral hepatitis C without hepatic coma: Secondary | ICD-10-CM | POA: Diagnosis not present

## 2016-11-21 DIAGNOSIS — K7030 Alcoholic cirrhosis of liver without ascites: Secondary | ICD-10-CM | POA: Diagnosis present

## 2016-11-21 LAB — CBC WITH DIFFERENTIAL/PLATELET
Basophils Absolute: 0.1 10*3/uL (ref 0.0–0.1)
Basophils Relative: 1 %
EOS PCT: 2 %
Eosinophils Absolute: 0.2 10*3/uL (ref 0.0–0.7)
HCT: 24.8 % — ABNORMAL LOW (ref 39.0–52.0)
HEMOGLOBIN: 8.1 g/dL — AB (ref 13.0–17.0)
LYMPHS PCT: 14 %
Lymphs Abs: 1.4 10*3/uL (ref 0.7–4.0)
MCH: 25.8 pg — AB (ref 26.0–34.0)
MCHC: 32.7 g/dL (ref 30.0–36.0)
MCV: 79 fL (ref 78.0–100.0)
MONO ABS: 1.3 10*3/uL — AB (ref 0.1–1.0)
Monocytes Relative: 13 %
Neutro Abs: 7.2 10*3/uL (ref 1.7–7.7)
Neutrophils Relative %: 70 %
Platelets: 103 10*3/uL — ABNORMAL LOW (ref 150–400)
RBC: 3.14 MIL/uL — ABNORMAL LOW (ref 4.22–5.81)
RDW: 23.8 % — ABNORMAL HIGH (ref 11.5–15.5)
WBC: 10.2 10*3/uL (ref 4.0–10.5)

## 2016-11-21 LAB — COMPREHENSIVE METABOLIC PANEL
ALK PHOS: 89 U/L (ref 38–126)
ALT: 22 U/L (ref 17–63)
ANION GAP: 6 (ref 5–15)
AST: 52 U/L — ABNORMAL HIGH (ref 15–41)
Albumin: 2.6 g/dL — ABNORMAL LOW (ref 3.5–5.0)
BUN: 10 mg/dL (ref 6–20)
CALCIUM: 8 mg/dL — AB (ref 8.9–10.3)
CO2: 22 mmol/L (ref 22–32)
Chloride: 105 mmol/L (ref 101–111)
Creatinine, Ser: 0.94 mg/dL (ref 0.61–1.24)
GFR calc Af Amer: >60 mL/min (ref >60)
GFR calc non Af Amer: >60 mL/min (ref >60)
GLUCOSE: 104 mg/dL — AB (ref 65–99)
Potassium: 3 mmol/L — ABNORMAL LOW (ref 3.5–5.1)
Sodium: 133 mmol/L — ABNORMAL LOW (ref 135–145)
Total Bilirubin: 8.9 mg/dL — ABNORMAL HIGH (ref 0.3–1.2)
Total Protein: 7.5 g/dL (ref 6.5–8.1)

## 2016-11-21 LAB — I-STAT CG4 LACTIC ACID, ED: Lactic Acid, Venous: 2.76 mmol/L (ref 0.5–1.9)

## 2016-11-21 LAB — LIPASE, BLOOD: Lipase: 30 U/L (ref 11–51)

## 2016-11-21 LAB — AMMONIA: Ammonia: 50 umol/L — ABNORMAL HIGH (ref 9–35)

## 2016-11-21 LAB — APTT: aPTT: 39 seconds — ABNORMAL HIGH (ref 24–36)

## 2016-11-21 LAB — PROTIME-INR
INR: 1.81
Prothrombin Time: 21.2 seconds — ABNORMAL HIGH (ref 11.4–15.2)

## 2016-11-21 MED ORDER — LORAZEPAM 1 MG PO TABS: 1.0000 mg | ORAL_TABLET | Freq: Four times a day (QID) | ORAL | Status: AC | PRN

## 2016-11-21 MED ORDER — OXYCODONE-ACETAMINOPHEN 5-325 MG PO TABS
1.0000 | ORAL_TABLET | Freq: Once | ORAL | Status: AC
  Administered 2016-11-21: 1 via ORAL
  Filled 2016-11-21: qty 1

## 2016-11-21 MED ORDER — IOPAMIDOL (ISOVUE-300) INJECTION 61%
INTRAVENOUS | Status: AC
  Filled 2016-11-21: qty 100

## 2016-11-21 MED ORDER — ADULT MULTIVITAMIN W/MINERALS CH
1.0000 | ORAL_TABLET | Freq: Every day | ORAL | Status: DC
  Administered 2016-11-22 – 2016-11-29 (×9): 1 via ORAL
  Filled 2016-11-21 (×9): qty 1

## 2016-11-21 MED ORDER — MORPHINE SULFATE (PF) 4 MG/ML IV SOLN
4.0000 mg | Freq: Once | INTRAVENOUS | Status: AC
  Administered 2016-11-21: 4 mg via INTRAVENOUS
  Filled 2016-11-21: qty 1

## 2016-11-21 MED ORDER — SODIUM CHLORIDE 0.9 % IV SOLN
Freq: Once | INTRAVENOUS | Status: AC
  Administered 2016-11-24: 07:00:00 via INTRAVENOUS

## 2016-11-21 MED ORDER — LORAZEPAM 2 MG/ML IJ SOLN
0.0000 mg | Freq: Four times a day (QID) | INTRAMUSCULAR | Status: AC
  Administered 2016-11-22 – 2016-11-23 (×2): 1 mg via INTRAVENOUS
  Filled 2016-11-21 (×2): qty 1

## 2016-11-21 MED ORDER — LORAZEPAM 2 MG/ML IJ SOLN
0.0000 mg | Freq: Two times a day (BID) | INTRAMUSCULAR | Status: AC
  Administered 2016-11-24: 1 mg via INTRAVENOUS
  Filled 2016-11-21: qty 1

## 2016-11-21 MED ORDER — THIAMINE HCL 100 MG/ML IJ SOLN
100.0000 mg | Freq: Every day | INTRAMUSCULAR | Status: DC
  Administered 2016-11-22: 100 mg via INTRAVENOUS
  Filled 2016-11-21: qty 2

## 2016-11-21 MED ORDER — LORAZEPAM 2 MG/ML IJ SOLN: 1.0000 mg | Freq: Four times a day (QID) | INTRAMUSCULAR | Status: AC | PRN

## 2016-11-21 MED ORDER — NICOTINE 21 MG/24HR TD PT24
21.0000 mg | MEDICATED_PATCH | Freq: Every day | TRANSDERMAL | Status: DC
  Administered 2016-11-22 – 2016-11-29 (×9): 21 mg via TRANSDERMAL
  Filled 2016-11-21 (×9): qty 1

## 2016-11-21 MED ORDER — PANTOPRAZOLE SODIUM 40 MG IV SOLR
40.0000 mg | Freq: Two times a day (BID) | INTRAVENOUS | Status: DC
  Administered 2016-11-22 – 2016-11-23 (×4): 40 mg via INTRAVENOUS
  Filled 2016-11-21 (×4): qty 40

## 2016-11-21 MED ORDER — SODIUM CHLORIDE 0.9 % IV BOLUS (SEPSIS)
1000.0000 mL | Freq: Once | INTRAVENOUS | Status: AC
  Administered 2016-11-21: 1000 mL via INTRAVENOUS

## 2016-11-21 MED ORDER — ONDANSETRON HCL 4 MG/2ML IJ SOLN: 4.0000 mg | Freq: Four times a day (QID) | INTRAMUSCULAR | Status: DC | PRN

## 2016-11-21 MED ORDER — SODIUM CHLORIDE 0.9% FLUSH
3.0000 mL | Freq: Two times a day (BID) | INTRAVENOUS | Status: DC
  Administered 2016-11-21 – 2016-11-29 (×9): 3 mL via INTRAVENOUS

## 2016-11-21 MED ORDER — ONDANSETRON HCL 4 MG PO TABS: 4.0000 mg | ORAL_TABLET | Freq: Four times a day (QID) | ORAL | Status: DC | PRN

## 2016-11-21 MED ORDER — DIPHENHYDRAMINE HCL 50 MG/ML IJ SOLN
12.5000 mg | Freq: Four times a day (QID) | INTRAMUSCULAR | Status: DC | PRN
  Administered 2016-11-22: 12.5 mg via INTRAVENOUS
  Filled 2016-11-21: qty 1

## 2016-11-21 MED ORDER — HYDROCODONE-ACETAMINOPHEN 5-325 MG PO TABS
1.0000 | ORAL_TABLET | ORAL | Status: DC | PRN
  Administered 2016-11-23: 1 via ORAL
  Administered 2016-11-23 – 2016-11-28 (×5): 2 via ORAL
  Administered 2016-11-28 – 2016-11-29 (×3): 1 via ORAL
  Filled 2016-11-21 (×2): qty 1
  Filled 2016-11-21 (×2): qty 2
  Filled 2016-11-21: qty 1
  Filled 2016-11-21: qty 2
  Filled 2016-11-21: qty 1
  Filled 2016-11-21 (×3): qty 2

## 2016-11-21 MED ORDER — POTASSIUM CHLORIDE CRYS ER 20 MEQ PO TBCR
60.0000 meq | EXTENDED_RELEASE_TABLET | ORAL | Status: AC
  Administered 2016-11-22: 60 meq via ORAL
  Filled 2016-11-21: qty 3

## 2016-11-21 MED ORDER — FOLIC ACID 1 MG PO TABS
1.0000 mg | ORAL_TABLET | Freq: Every day | ORAL | Status: DC
  Administered 2016-11-22 – 2016-11-29 (×9): 1 mg via ORAL
  Filled 2016-11-21 (×9): qty 1

## 2016-11-21 MED ORDER — VITAMIN B-1 100 MG PO TABS
100.0000 mg | ORAL_TABLET | Freq: Every day | ORAL | Status: DC
  Administered 2016-11-22 – 2016-11-23 (×2): 100 mg via ORAL
  Filled 2016-11-21 (×3): qty 1

## 2016-11-21 MED ORDER — IOPAMIDOL (ISOVUE-300) INJECTION 61%
INTRAVENOUS | Status: AC
  Administered 2016-11-21: 100 mL
  Filled 2016-11-21: qty 100

## 2016-11-21 MED ORDER — POTASSIUM CHLORIDE CRYS ER 20 MEQ PO TBCR: 40.0000 meq | EXTENDED_RELEASE_TABLET | ORAL | Status: DC

## 2016-11-21 NOTE — H&P (Signed)
History and Physical    Alfred [name] HWK:[id number] DOB: 08/28/1950 DOA: 11/21/2016  Referring MD/NP/PA: Dr. Rocky Link PCP: Patient, No Pcp Per  Patient coming from: Home  Chief Complaint: right groin pain  HPI: Alfred [name] is a 66 y.o. male with medical history significant of alcohol abuse, hepatitis C, alcoholic cirrhosis, and tobacco abuse; who presents with right sided groin pain. Patienthad a right inguinal hernia for a few years now, but yesterday afternoon while lifting a car battery he had acute worsening pain. Describes the pain as unbearable, sharp, and throbbing in nature causing him double over. Any movement seems to worsen pain. He denies being on any medications, does not have insurance, and does not routinely see a primary care provider. Associated symptoms include decreased appetite, abdominal distention, and reports of black tarry stools. His last bowel movement was earlier this morning. He denies having any nausea, vomiting, cramps, chest pain, shortness of breath, confusion. Patient reports drinking a couple of beers and liquor daily as well as smoking one pack of cigarettes per day on average.  ED Course:  for admission into the emergency department patient was seen to be afebrile, heart rate 78-107, respirations 12-18,and all other vital signs are within normal limits.  Labs revealed WBC 10.2, hemoglobin 8.1, platelets 103, sodium 133, potassium 3 Seen by [facility] of general surgery. Patient was given 1 L of IV fluids and morphine while in the emergency department.  Review of Systems: As per HPI otherwise 10 point review of systems negative.   Past Medical History:  Diagnosis Date  . Delirium tremens ([place]) 09/10/2013  . Dental abscess 09/10/2013  . Knee effusion, left 09/10/2013  . Mental status change 09/10/2013    Past Surgical History:  Procedure Laterality Date  . LAPAROSCOPIC ABDOMINAL EXPLORATION N/A 12/12/2014   Procedure: LAPAROSCOPIC ABDOMINAL EXPLORATION  Omental patch for perforation and core liver biopsies;  Surgeon: Michael Boston, MD;  Location: [facility];  Service: General;  Laterality: N/A;     reports that he has been smoking Cigarettes.  He has been smoking about 1.00 pack per day. He has never used smokeless tobacco. He reports that he drinks alcohol. He reports that he does not use drugs.  Allergies  Allergen Reactions  . Nsaids Other (See Comments)    Perforated ulcer    No family history on file.  Prior to Admission medications   Medication Sig Start Date End Date Taking? Authorizing Provider  cloNIDine (CATAPRES - DOSED IN MG/24 HR) 0.2 mg/24hr patch Place 1 patch (0.2 mg total) onto the skin once a week. Patient not taking: Reported on 11/21/2016 12/27/14   Donne Hazel, MD  methocarbamol (ROBAXIN) 500 MG tablet Take 2 tablets (1,000 mg total) by mouth every 6 (six) hours as needed for muscle spasms. Patient not taking: Reported on 11/21/2016 12/26/14   Donne Hazel, MD  metoprolol tartrate (LOPRESSOR) 25 MG tablet Take 1 tablet (25 mg total) by mouth 2 (two) times daily. Patient not taking: Reported on 11/21/2016 12/26/14   Donne Hazel, MD  pantoprazole (PROTONIX) 40 MG tablet Take 1 tablet (40 mg total) by mouth 2 (two) times daily. Patient not taking: Reported on 11/21/2016 12/26/14   Donne Hazel, MD  thiamine 100 MG tablet Take 1 tablet (100 mg total) by mouth daily. Patient not taking: Reported on 11/21/2016 09/10/13   Verlee Monte, MD    Physical Exam: Constitutional: Older male who appears to be in moderate discomfort. Vitals:  11/21/16 1645 11/21/16 1745 11/21/16 1919 11/21/16 1941  BP: 119/76 111/70 117/82 108/76  Pulse: 95 90 94 91  Resp:    14  Temp:      TempSrc:      SpO2: 96% 95% 95% 95%  Weight:      Height:       Eyes: PERRL, lids and conjunctivae normal ENMT: Mucous membranes are moist. Posterior pharynx clear of any exudate or lesions.Poor dentition  Neck: normal, supple, no masses, no  thyromegaly Respiratory: clear to auscultation bilaterally, no wheezing, no crackles. Normal respiratory effort. No accessory muscle use.  Cardiovascular: Regular rate and rhythm, no murmurs / rubs / gallops. No extremity edema. 2+ pedal pulses. No carotid bruits.  Abdomen: Mild abdominal distention with positive fluid wave. Bowel sounds present. Right inguinal hernia present that is not easily reduced. Musculoskeletal: no clubbing / cyanosis. No joint deformity upper and lower extremities. Good ROM, no contractures. Normal muscle tone.  Skin: Multiple scars present in the upper and lower body reported secondary to blood products. Neurologic: CN 2-12 grossly intact. Sensation intact, DTR normal. Strength 5/5 in all 4.  Psychiatric: Normal judgment and insight. Alert and oriented x 3. Normal mood.     Labs on Admission: I have personally reviewed following labs and imaging studies  CBC:  Recent Labs Lab 11/21/16 1636  WBC 10.2  NEUTROABS 7.2  HGB 8.1*  HCT 24.8*  MCV 79.0  PLT 654*   Basic Metabolic Panel:  Recent Labs Lab 11/21/16 1636  NA 133*  K 3.0*  CL 105  CO2 22  GLUCOSE 104*  BUN 10  CREATININE 0.94  CALCIUM 8.0*   GFR: Estimated Creatinine Clearance: 84 mL/min (by C-G formula based on SCr of 0.94 mg/dL). Liver Function Tests:  Recent Labs Lab 11/21/16 1636  AST 52*  ALT 22  ALKPHOS 89  BILITOT 8.9*  PROT 7.5  ALBUMIN 2.6*    Recent Labs Lab 11/21/16 1636  LIPASE 30   No results for input(s): AMMONIA in the last 168 hours. Coagulation Profile: No results for input(s): INR, PROTIME in the last 168 hours. Cardiac Enzymes: No results for input(s): CKTOTAL, CKMB, CKMBINDEX, TROPONINI in the last 168 hours. BNP (last 3 results) No results for input(s): PROBNP in the last 8760 hours. HbA1C: No results for input(s): HGBA1C in the last 72 hours. CBG: No results for input(s): GLUCAP in the last 168 hours. Lipid Profile: No results for input(s):  CHOL, HDL, LDLCALC, TRIG, CHOLHDL, LDLDIRECT in the last 72 hours. Thyroid Function Tests: No results for input(s): TSH, T4TOTAL, FREET4, T3FREE, THYROIDAB in the last 72 hours. Anemia Panel: No results for input(s): VITAMINB12, FOLATE, FERRITIN, TIBC, IRON, RETICCTPCT in the last 72 hours. Urine analysis:    Component Value Date/Time   COLORURINE YELLOW 12/21/2014 0829   APPEARANCEUR CLEAR 12/21/2014 0829   LABSPEC 1.013 12/21/2014 0829   PHURINE 8.0 12/21/2014 0829   GLUCOSEU NEGATIVE 12/21/2014 0829   HGBUR NEGATIVE 12/21/2014 0829   BILIRUBINUR NEGATIVE 12/21/2014 0829   KETONESUR NEGATIVE 12/21/2014 0829   PROTEINUR NEGATIVE 12/21/2014 0829   UROBILINOGEN 0.2 12/21/2014 0829   NITRITE NEGATIVE 12/21/2014 0829   LEUKOCYTESUR NEGATIVE 12/21/2014 0829   Sepsis Labs: No results found for this or any previous visit (from the past 240 hour(s)).   Radiological Exams on Admission: Ct Abdomen Pelvis W Contrast  Result Date: 11/21/2016 CLINICAL DATA:  Acute onset of right inguinal pain after lifting battery. Initial encounter. EXAM: CT ABDOMEN AND PELVIS WITH  CONTRAST TECHNIQUE: Multidetector CT imaging of the abdomen and pelvis was performed using the standard protocol following bolus administration of intravenous contrast. CONTRAST:  161m ISOVUE-300 IOPAMIDOL (ISOVUE-300) INJECTION 61% COMPARISON:  Right upper quadrant ultrasound performed 12/23/2014, and CT of the abdomen and pelvis performed 12/12/2014 FINDINGS: Lower chest: Minimal bibasilar atelectasis is noted. The visualized portions of the mediastinum are unremarkable. Hepatobiliary: There is diffuse nodularity of the liver, with associated heterogeneity, reflecting hepatic cirrhosis and venous congestion. There is recanalization of the umbilical vein. Scattered gastric and splenic varices are noted. The gallbladder is mildly distended, and contains several small stones. The common bile duct remains normal in caliber. Pancreas: The  pancreas is within normal limits. Spleen: The spleen is unremarkable in appearance. Adrenals/Urinary Tract: The adrenal glands are unremarkable in appearance. The kidneys are within normal limits. There is no evidence of hydronephrosis. No renal or ureteral stones are identified. Mild left-sided nonspecific perinephric stranding is noted. Stomach/Bowel: The stomach is unremarkable in appearance. There is herniation of a long segment of distal ileum into a large right inguinal hernia, with mild associated soft tissue edema and a small amount of free fluid. There is no evidence of bowel obstruction. The appendix is normal in caliber, without evidence of appendicitis. Wall thickening along the ascending colon raises concern for an acute infectious or inflammatory colitis. Vascular/Lymphatic: Scattered calcification is seen along the distal abdominal aorta and its branches. The abdominal aorta is otherwise grossly unremarkable. The inferior vena cava is grossly unremarkable. No retroperitoneal lymphadenopathy is seen. No pelvic sidewall lymphadenopathy is identified. Reproductive: The bladder is mildly distended and grossly unremarkable. The prostate remains normal in size. Other: Small volume ascites is seen within the abdomen and pelvis. Musculoskeletal: No acute osseous abnormalities are identified. The visualized musculature is unremarkable in appearance. IMPRESSION: 1. Wall thickening along the ascending colon raises concern for acute infectious or inflammatory colitis, though it could be chronic in nature. 2. Herniation of a long segment of distal ileum into a large right inguinal hernia, with mild associated soft tissue inflammation and a small amount of free fluid. No evidence of bowel obstruction. 3. Findings of hepatic cirrhosis, with re- cannulization of the umbilical vein, and scattered gastric and splenic varices. 4. Small volume ascites within the abdomen and pelvis. 5. Scattered aortic atherosclerosis. 6.  Cholelithiasis, with mild gallbladder distention. Gallbladder otherwise unremarkable. Electronically Signed   By: JGarald BaldingM.D.   On: 11/21/2016 18:11    Assessment/Plan Right inguinal hernia: Acute on chronic. Patient with worsening pain from right inguinal hernia. Evaluated by general surgery, but there was concern for operative risk. - Admit to stepdown bed   - Liquid diet - Pain control - Apply truss - Gen. surgery consulted, follow-up further recommendations  - Social work consult for social issues  Possible Ascending colitis: Acute. As seen on CT, patient does not show any clear signs of infection. - Check CRP and ESR -Will give Ciprofloxacin and Flagyl  Lactic acidosis: Acute lactic acid elevated at 2.76 on admission. Question possibility of underlying infection given CT imaging. - Trend lactic acid level  Melena: Patient reports having black tarry stools with history of alcohol abuse, and denies any NSAID use. He - check stool guaiac  - Protonix IV - T&S for possible need of blood products   Elevated liver enzymes, alcoholic liver cirrhosis, hep C: Acute. Patient had have elevated total bilirubin 8.9 with AST 52 and ALT 22. She reports never receiving treatment for his cirrhosis or hepatitis  C. - Check fractionated bilirubin in a.m.  - Follow-up ammonia level - Pain warrant lactulose, but like to rule out GI bleed first  - Will likely need to consult GI in a.m. to help with management  Cholelithiasis  With mild gallbladder wall thickening seen on CT scan. - Check abdominal ultrasound  Hypokalemia: Acute. Initial potassium 3.0 on admission - Give 60 mEq 1 dose now  Microcytic anemia: Acute. Hemoglobin 8.1 on admission - Check anemia panel - Repeat CBC in a.m.   Alcohol abuse with H/O delirium tremens - Check alcohol level - CWIA protocol with scheduled Ativan  Tobacco abuse - Apply nicotine patch - Counseled on the need for cessation of tobacco  Bed bug  bites - Benadryl prn itching  Thrombocytopenia: Platelet count 103 on admission. Likely secondary to patient history of alcohol abuse and liver cirrhosis. - continue to monitor  Coagulopathy: Elevated PT/APTT suspected secondary to patient's liver disease. - Continue to monitor   Dvt prophylaxis: SCD  Code Status: full Family Communication:  no family present at bedside  Disposition Plan:  will likely discharge home once medically stable  Consults called: General surgery  Admission status: Inpatient  Norval Morton MD [facility] Pager [phone]  If 7PM-7AM, please contact night-coverage [email or web address] Password [email or web address]  11/21/2016, 9:12 PM

## 2016-11-21 NOTE — ED Provider Notes (Signed)
Emergency Department Provider Note   I have reviewed the triage vital signs and the nursing notes.   HISTORY  Chief Complaint Inguinal Hernia   HPI Alfred [name] is a 66 y.o. male with PMH of DTs, Hep C, alcoholic cirrhosis presents emergency department for evaluation of right inguinal hernia. The patient has had a known chronic right inguinal hernia but has not followed with a general surgeon because of insurance issues. Patient was lifting a box yesterday when he suddenly had worsening swelling in the right scrotum with severe pain. Reports worsening pain with any kind of Valsalva maneuvers. Continues to have bowel movements and pass gas. No vomiting. Does have some mild nausea. No fevers or chills. No radiation of symptoms.    Past Medical History:  Diagnosis Date  . Delirium tremens (HCC) 09/10/2013  . Dental abscess 09/10/2013  . Knee effusion, left 09/10/2013  . Mental status change 09/10/2013    Patient Active Problem List   Diagnosis Date Noted  . Lactic acidosis 11/22/2016  . Cholelithiasis 11/22/2016  . Microcytic anemia 11/22/2016  . Melena 11/22/2016  . Right inguinal hernia 11/21/2016  . Effusion of left knee 12/20/2014  . Hepatitis C infection 12/20/2014  . Elevated blood pressure 12/20/2014  . Abnormal LFTs   . Inguinal hernia, right 12/13/2014  . Family history of colon cancer (father) 12/13/2014  . Perforated duodenal bulb ulcer s/p lap omental patch 12/13/2014 12/12/2014  . Cirrhosis, alcoholic (HCC) 12/12/2014  . Tobacco abuse 12/12/2014  . Colonoscopy refused 12/12/2014  . Protein-calorie malnutrition, moderate (HCC) 09/10/2013  . Alcohol abuse 09/10/2013  . Alcoholic hepatitis 09/10/2013    Past Surgical History:  Procedure Laterality Date  . LAPAROSCOPIC ABDOMINAL EXPLORATION N/A 12/12/2014   Procedure: LAPAROSCOPIC ABDOMINAL EXPLORATION Omental patch for perforation and core liver biopsies;  Surgeon: Karie Soda, MD;  Location: [facility];  Service:  General;  Laterality: N/A;      Allergies Nsaids  No family history on file.  Social History Social History  Substance Use Topics  . Smoking status: Current Every Day Smoker    Packs/day: 1.00    Types: Cigarettes  . Smokeless tobacco: Never Used  . Alcohol use Yes     Comment: 6 pack a day/heavy    Review of Systems  Constitutional: No fever/chills Eyes: No visual changes. ENT: No sore throat. Cardiovascular: Denies chest pain. Respiratory: Denies shortness of breath. Gastrointestinal: Positive right lower abdominal pain and swelling. Positive nausea, no vomiting.  No diarrhea.  No constipation. Genitourinary: Negative for dysuria. Musculoskeletal: Negative for back pain. Skin: Negative for rash. Neurological: Negative for headaches, focal weakness or numbness.  10-point ROS otherwise negative.  ____________________________________________   PHYSICAL EXAM:  VITAL SIGNS: ED Triage Vitals  Enc Vitals Group     BP 11/21/16 1225 (!) 141/81     Pulse Rate 11/21/16 1225 (!) 104     Resp 11/21/16 1225 18     Temp 11/21/16 1225 98.2 F (36.8 C)     Temp Source 11/21/16 1225 Oral     SpO2 11/21/16 1225 100 %     Weight 11/21/16 1227 165 lb (74.8 kg)     Height 11/21/16 1227 6\' 1"  (1.854 m)     Pain Score 11/21/16 1225 9   Constitutional: Alert and oriented. Well appearing and in no acute distress. Eyes: Conjunctivae are normal.  Head: Atraumatic. Nose: No congestion/rhinnorhea. Mouth/Throat: Mucous membranes are moist.  Oropharynx non-erythematous. Neck: No stridor.   Cardiovascular: Normal rate, regular  rhythm. Good peripheral circulation. Grossly normal heart sounds.   Respiratory: Normal respiratory effort.  No retractions. Lungs CTAB. Gastrointestinal: Soft and nontender. No distention.  Genitourinary: Large inguinal hernia in the right scrotum. No discoloration. Able to reduce fully at bedside with gentle pressure but the hernia returns immediately with  even slight valsalva.  Musculoskeletal: No lower extremity tenderness nor edema. No gross deformities of extremities. Neurologic:  Normal speech and language. No gross focal neurologic deficits are appreciated.  Skin:  Skin is warm, dry and intact. No rash noted.  ____________________________________________   LABS (all labs ordered are listed, but only abnormal results are displayed)  Labs Reviewed  COMPREHENSIVE METABOLIC PANEL - Abnormal; Notable for the following:       Result Value   Sodium 133 (*)    Potassium 3.0 (*)    Glucose, Bld 104 (*)    Calcium 8.0 (*)    Albumin 2.6 (*)    AST 52 (*)    Total Bilirubin 8.9 (*)    All other components within normal limits  CBC WITH DIFFERENTIAL/PLATELET - Abnormal; Notable for the following:    RBC 3.14 (*)    Hemoglobin 8.1 (*)    HCT 24.8 (*)    MCH 25.8 (*)    RDW 23.8 (*)    Platelets 103 (*)    Monocytes Absolute 1.3 (*)    All other components within normal limits  PROTIME-INR - Abnormal; Notable for the following:    Prothrombin Time 21.2 (*)    All other components within normal limits  APTT - Abnormal; Notable for the following:    aPTT 39 (*)    All other components within normal limits  AMMONIA - Abnormal; Notable for the following:    Ammonia 50 (*)    All other components within normal limits  COMPREHENSIVE METABOLIC PANEL - Abnormal; Notable for the following:    Sodium 133 (*)    Potassium 3.0 (*)    CO2 18 (*)    Calcium 7.7 (*)    Albumin 2.5 (*)    Total Bilirubin 8.0 (*)    All other components within normal limits  BILIRUBIN, FRACTIONATED(TOT/DIR/INDIR) - Abnormal; Notable for the following:    Total Bilirubin 8.5 (*)    Bilirubin, Direct 3.7 (*)    Indirect Bilirubin 4.8 (*)    All other components within normal limits  VITAMIN B12 - Abnormal; Notable for the following:    Vitamin B-12 3,044 (*)    All other components within normal limits  IRON AND TIBC - Abnormal; Notable for the following:      Iron 40 (*)    Saturation Ratios 14 (*)    All other components within normal limits  RETICULOCYTES - Abnormal; Notable for the following:    Retic Ct Pct 4.2 (*)    RBC. 3.23 (*)    All other components within normal limits  MAGNESIUM - Abnormal; Notable for the following:    Magnesium 1.6 (*)    All other components within normal limits  C-REACTIVE PROTEIN - Abnormal; Notable for the following:    CRP 1.3 (*)    All other components within normal limits  SEDIMENTATION RATE - Abnormal; Notable for the following:    Sed Rate 20 (*)    All other components within normal limits  CBC - Abnormal; Notable for the following:    RBC 3.23 (*)    Hemoglobin 8.2 (*)    HCT 25.3 (*)  MCH 25.4 (*)    RDW 23.8 (*)    Platelets 90 (*)    All other components within normal limits  HEMOGLOBIN AND HEMATOCRIT, BLOOD - Abnormal; Notable for the following:    Hemoglobin 7.0 (*)    HCT 22.1 (*)    All other components within normal limits  I-STAT CG4 LACTIC ACID, ED - Abnormal; Notable for the following:    Lactic Acid, Venous 2.76 (*)    All other components within normal limits  MRSA PCR SCREENING  LIPASE, BLOOD  FOLATE  FERRITIN  LACTIC ACID, PLASMA  LACTIC ACID, PLASMA  HIV ANTIBODY (ROUTINE TESTING)  OCCULT BLOOD X 1 CARD TO LAB, STOOL  URINALYSIS, ROUTINE W REFLEX MICROSCOPIC  BASIC METABOLIC PANEL  TYPE AND SCREEN  ABO/RH   ____________________________________________  RADIOLOGY  Ct Abdomen Pelvis W Contrast  Result Date: 11/21/2016 CLINICAL DATA:  Acute onset of right inguinal pain after lifting battery. Initial encounter. EXAM: CT ABDOMEN AND PELVIS WITH CONTRAST TECHNIQUE: Multidetector CT imaging of the abdomen and pelvis was performed using the standard protocol following bolus administration of intravenous contrast. CONTRAST:  100mL ISOVUE-300 IOPAMIDOL (ISOVUE-300) INJECTION 61% COMPARISON:  Right upper quadrant ultrasound performed 12/23/2014, and CT of the abdomen  and pelvis performed 12/12/2014 FINDINGS: Lower chest: Minimal bibasilar atelectasis is noted. The visualized portions of the mediastinum are unremarkable. Hepatobiliary: There is diffuse nodularity of the liver, with associated heterogeneity, reflecting hepatic cirrhosis and venous congestion. There is recanalization of the umbilical vein. Scattered gastric and splenic varices are noted. The gallbladder is mildly distended, and contains several small stones. The common bile duct remains normal in caliber. Pancreas: The pancreas is within normal limits. Spleen: The spleen is unremarkable in appearance. Adrenals/Urinary Tract: The adrenal glands are unremarkable in appearance. The kidneys are within normal limits. There is no evidence of hydronephrosis. No renal or ureteral stones are identified. Mild left-sided nonspecific perinephric stranding is noted. Stomach/Bowel: The stomach is unremarkable in appearance. There is herniation of a Kaylon Laroche segment of distal ileum into a large right inguinal hernia, with mild associated soft tissue edema and a small amount of free fluid. There is no evidence of bowel obstruction. The appendix is normal in caliber, without evidence of appendicitis. Wall thickening along the ascending colon raises concern for an acute infectious or inflammatory colitis. Vascular/Lymphatic: Scattered calcification is seen along the distal abdominal aorta and its branches. The abdominal aorta is otherwise grossly unremarkable. The inferior vena cava is grossly unremarkable. No retroperitoneal lymphadenopathy is seen. No pelvic sidewall lymphadenopathy is identified. Reproductive: The bladder is mildly distended and grossly unremarkable. The prostate remains normal in size. Other: Small volume ascites is seen within the abdomen and pelvis. Musculoskeletal: No acute osseous abnormalities are identified. The visualized musculature is unremarkable in appearance. IMPRESSION: 1. Wall thickening along the  ascending colon raises concern for acute infectious or inflammatory colitis, though it could be chronic in nature. 2. Herniation of a Analei Whinery segment of distal ileum into a large right inguinal hernia, with mild associated soft tissue inflammation and a small amount of free fluid. No evidence of bowel obstruction. 3. Findings of hepatic cirrhosis, with re- cannulization of the umbilical vein, and scattered gastric and splenic varices. 4. Small volume ascites within the abdomen and pelvis. 5. Scattered aortic atherosclerosis. 6. Cholelithiasis, with mild gallbladder distention. Gallbladder otherwise unremarkable. Electronically Signed   By: Roanna RaiderJeffery  Chang M.D.   On: 11/21/2016 18:11   [place] Abdomen Limited Ruq  Result Date: 11/21/2016 CLINICAL DATA:  66 y/o  M; cholelithiasis. EXAM: ULTRASOUND ABDOMEN LIMITED RIGHT UPPER QUADRANT COMPARISON:  11/21/2016 CT of the abdomen and pelvis. FINDINGS: Gallbladder: Negative sonographic Murphy sign. Gallstones measuring up to 3 mm. Mild gallbladder wall thickening and pericholecystic fluid. Common bile duct: Diameter: 3 mm Liver: Cirrhotic configuration of the liver. Small volume of perihepatic ascites. IMPRESSION: 1. Cholelithiasis. 2. Mild gallbladder wall thickening and pericholecystic fluid is indeterminate for acute cholecystitis in the setting of ascites. Negative sonographic Murphy's sign. 3. Liver cirrhosis. Electronically Signed   By: Mitzi Hansen M.D.   On: 11/21/2016 22:57    ____________________________________________   PROCEDURES  Procedure(s) performed:   Procedures  None ____________________________________________   INITIAL IMPRESSION / ASSESSMENT AND PLAN / ED COURSE  Pertinent labs & imaging results that were available during my care of the patient were reviewed by me and considered in my medical decision making (see chart for details).  Patient with reducible right inguinal hernia that is acute on chronic. Lactate elevated. No  abdominal tenderness to palpation. No nausea or vomiting. Plan for CT and reassessment.   07:56 PM Spoke with Dr. Sheliah Hatch with general surgery who will be in to see the patient. Tried oral pain medication and the patient still with significant discomfort with any kind of sitting up or walking. He does feel slightly more comfortable if lying completely still. Unable to explain the patient's elevated T bili. His CT scan showed normal caliber common bile ducts and only mildly distended gallbladder. LFTs are unremarkable. The patient has no right upper quadrant tenderness. The colitis symptoms found on CT are chronic rather than acute with no focal abdominal tenderness. The patient's discomfort is isolated to the right inguinal and scrotal area. Appreciate surgery assistance with the case.   Discussed patient's case with Hospitalist, Dr. Katrinka Blazing. Patient and family (if present) updated with plan. Care transferred to Hospitalist service.  I reviewed all nursing notes, vitals, pertinent old records, EKGs, labs, imaging (as available).  ____________________________________________  FINAL CLINICAL IMPRESSION(S) / ED DIAGNOSES  Final diagnoses:  Right inguinal hernia     MEDICATIONS GIVEN DURING THIS VISIT:  Medications  iopamidol (ISOVUE-300) 61 % injection (not administered)  sodium chloride flush (NS) 0.9 % injection 3 mL (3 mLs Intravenous Given 11/22/16 0917)  HYDROcodone-acetaminophen (NORCO/VICODIN) 5-325 MG per tablet 1-2 tablet (not administered)  ondansetron (ZOFRAN) tablet 4 mg (not administered)    Or  ondansetron (ZOFRAN) injection 4 mg (not administered)  nicotine (NICODERM CQ - dosed in mg/24 hours) patch 21 mg (21 mg Transdermal Patch Applied 11/22/16 0917)  pantoprazole (PROTONIX) injection 40 mg (40 mg Intravenous Given 11/22/16 0916)  LORazepam (ATIVAN) tablet 1 mg (not administered)    Or  LORazepam (ATIVAN) injection 1 mg (not administered)  thiamine (VITAMIN B-1) tablet 100  mg ( Oral See Alternative 11/22/16 0916)    Or  thiamine (B-1) injection 100 mg (100 mg Intravenous Given 11/22/16 0916)  folic acid (FOLVITE) tablet 1 mg (1 mg Oral Given 11/22/16 0916)  multivitamin with minerals tablet 1 tablet (1 tablet Oral Given 11/22/16 0916)  LORazepam (ATIVAN) injection 0-4 mg (1 mg Intravenous Given 11/22/16 0614)    Followed by  LORazepam (ATIVAN) injection 0-4 mg (not administered)  0.9 %  sodium chloride infusion ( Intravenous Not Given 11/21/16 2200)  diphenhydrAMINE (BENADRYL) injection 12.5 mg (not administered)  pneumococcal 23 valent vaccine (PNU-IMMUNE) injection 0.5 mL (not administered)  metroNIDAZOLE (FLAGYL) IVPB 500 mg (0 mg Intravenous Stopped 11/22/16 0751)  ciprofloxacin (CIPRO) IVPB 400  mg (0 mg Intravenous Stopped 11/22/16 0751)  morphine 4 MG/ML injection 4 mg (4 mg Intravenous Given 11/21/16 1639)  iopamidol (ISOVUE-300) 61 % injection (100 mLs  Contrast Given 11/21/16 1750)  oxyCODONE-acetaminophen (PERCOCET/ROXICET) 5-325 MG per tablet 1 tablet (1 tablet Oral Given 11/21/16 1918)  sodium chloride 0.9 % bolus 1,000 mL (1,000 mLs Intravenous New Bag/Given 11/21/16 2210)  potassium chloride SA (K-DUR,KLOR-CON) CR tablet 60 mEq (60 mEq Oral Given 11/22/16 0107)  magnesium sulfate IVPB 2 g 50 mL (0 g Intravenous Stopped 11/22/16 0603)  calcium gluconate 1 g in sodium chloride 0.9 % 100 mL IVPB (0 g Intravenous Stopped 11/22/16 0603)     NEW OUTPATIENT MEDICATIONS STARTED DURING THIS VISIT:  None   Note:  This document was prepared using Dragon voice recognition software and may include unintentional dictation errors.  Alona Bene, MD Emergency Medicine   Dock Baccam, Arlyss Repress, MD 11/22/16 [phone]

## 2016-11-21 NOTE — Consult Note (Signed)
Reason for Consult: hernia Referring Physician: Zakariyya Helfman is an 66 y.o. male.  HPI: 66 yo male with history of alcohol abuse, liver cirrhosis presents with worsening pain in his right groin over the past 3 days. He has had a hernia present for multiple years but it has become much more painful recently. He has nausea but no vomiting. He denies diarrhea or constipation. He denies fevers or chills  He continues to smoke 1ppd. He had 3 alcoholic drinks yesterday. He denies having abdominal surgery though he has multiple abdominal scars. He does not regularly see a physician of any kind and notes his last hospitalization was for "detox".  Past Medical History:  Diagnosis Date  . Delirium tremens ([place]) 09/10/2013  . Dental abscess 09/10/2013  . Knee effusion, left 09/10/2013  . Mental status change 09/10/2013    Past Surgical History:  Procedure Laterality Date  . LAPAROSCOPIC ABDOMINAL EXPLORATION N/A 12/12/2014   Procedure: LAPAROSCOPIC ABDOMINAL EXPLORATION Omental patch for perforation and core liver biopsies;  Surgeon: Michael Boston, MD;  Location: [facility];  Service: General;  Laterality: N/A;    No family history on file.  Social History:  reports that he has been smoking Cigarettes.  He has been smoking about 1.00 pack per day. He has never used smokeless tobacco. He reports that he drinks alcohol. He reports that he does not use drugs.  Allergies:  Allergies  Allergen Reactions  . Nsaids Other (See Comments)    Perforated ulcer    Medications: I have reviewed the patient's current medications.  Results for orders placed or performed during the hospital encounter of 11/21/16 (from the past 48 hour(s))  Comprehensive metabolic panel     Status: Abnormal   Collection Time: 11/21/16  4:36 PM  Result Value Ref Range   Sodium 133 (L) 135 - 145 mmol/L   Potassium 3.0 (L) 3.5 - 5.1 mmol/L   Chloride 105 101 - 111 mmol/L   CO2 22 22 - 32 mmol/L   Glucose, Bld 104 (H) 65 -  99 mg/dL   BUN 10 6 - 20 mg/dL   Creatinine, Ser 0.94 0.61 - 1.24 mg/dL   Calcium 8.0 (L) 8.9 - 10.3 mg/dL   Total Protein 7.5 6.5 - 8.1 g/dL   Albumin 2.6 (L) 3.5 - 5.0 g/dL   AST 52 (H) 15 - 41 U/L   ALT 22 17 - 63 U/L   Alkaline Phosphatase 89 38 - 126 U/L   Total Bilirubin 8.9 (H) 0.3 - 1.2 mg/dL   GFR calc non Af Amer >60 >60 mL/min   GFR calc Af Amer >60 >60 mL/min    Comment: (NOTE) The eGFR has been calculated using the CKD EPI equation. This calculation has not been validated in all clinical situations. eGFR's persistently <60 mL/min signify possible Chronic Kidney Disease.    Anion gap 6 5 - 15  Lipase, blood     Status: None   Collection Time: 11/21/16  4:36 PM  Result Value Ref Range   Lipase 30 11 - 51 U/L  CBC with Differential     Status: Abnormal   Collection Time: 11/21/16  4:36 PM  Result Value Ref Range   WBC 10.2 4.0 - 10.5 K/uL   RBC 3.14 (L) 4.22 - 5.81 MIL/uL   Hemoglobin 8.1 (L) 13.0 - 17.0 g/dL   HCT 24.8 (L) 39.0 - 52.0 %   MCV 79.0 78.0 - 100.0 fL   MCH 25.8 (L) 26.0 -  34.0 pg   MCHC 32.7 30.0 - 36.0 g/dL   RDW 23.8 (H) 11.5 - 15.5 %   Platelets 103 (L) 150 - 400 K/uL    Comment: REPEATED TO VERIFY PLATELET COUNT CONFIRMED BY SMEAR    Neutrophils Relative % 70 %   Lymphocytes Relative 14 %   Monocytes Relative 13 %   Eosinophils Relative 2 %   Basophils Relative 1 %   Neutro Abs 7.2 1.7 - 7.7 K/uL   Lymphs Abs 1.4 0.7 - 4.0 K/uL   Monocytes Absolute 1.3 (H) 0.1 - 1.0 K/uL   Eosinophils Absolute 0.2 0.0 - 0.7 K/uL   Basophils Absolute 0.1 0.0 - 0.1 K/uL   RBC Morphology POLYCHROMASIA PRESENT     Comment: TARGET CELLS  I-Stat CG4 Lactic Acid, ED     Status: Abnormal   Collection Time: 11/21/16  4:49 PM  Result Value Ref Range   Lactic Acid, Venous 2.76 (HH) 0.5 - 1.9 mmol/L   Comment NOTIFIED PHYSICIAN     Ct Abdomen Pelvis W Contrast  Result Date: 11/21/2016 CLINICAL DATA:  Acute onset of right inguinal pain after lifting battery.  Initial encounter. EXAM: CT ABDOMEN AND PELVIS WITH CONTRAST TECHNIQUE: Multidetector CT imaging of the abdomen and pelvis was performed using the standard protocol following bolus administration of intravenous contrast. CONTRAST:  132m ISOVUE-300 IOPAMIDOL (ISOVUE-300) INJECTION 61% COMPARISON:  Right upper quadrant ultrasound performed 12/23/2014, and CT of the abdomen and pelvis performed 12/12/2014 FINDINGS: Lower chest: Minimal bibasilar atelectasis is noted. The visualized portions of the mediastinum are unremarkable. Hepatobiliary: There is diffuse nodularity of the liver, with associated heterogeneity, reflecting hepatic cirrhosis and venous congestion. There is recanalization of the umbilical vein. Scattered gastric and splenic varices are noted. The gallbladder is mildly distended, and contains several small stones. The common bile duct remains normal in caliber. Pancreas: The pancreas is within normal limits. Spleen: The spleen is unremarkable in appearance. Adrenals/Urinary Tract: The adrenal glands are unremarkable in appearance. The kidneys are within normal limits. There is no evidence of hydronephrosis. No renal or ureteral stones are identified. Mild left-sided nonspecific perinephric stranding is noted. Stomach/Bowel: The stomach is unremarkable in appearance. There is herniation of a long segment of distal ileum into a large right inguinal hernia, with mild associated soft tissue edema and a small amount of free fluid. There is no evidence of bowel obstruction. The appendix is normal in caliber, without evidence of appendicitis. Wall thickening along the ascending colon raises concern for an acute infectious or inflammatory colitis. Vascular/Lymphatic: Scattered calcification is seen along the distal abdominal aorta and its branches. The abdominal aorta is otherwise grossly unremarkable. The inferior vena cava is grossly unremarkable. No retroperitoneal lymphadenopathy is seen. No pelvic sidewall  lymphadenopathy is identified. Reproductive: The bladder is mildly distended and grossly unremarkable. The prostate remains normal in size. Other: Small volume ascites is seen within the abdomen and pelvis. Musculoskeletal: No acute osseous abnormalities are identified. The visualized musculature is unremarkable in appearance. IMPRESSION: 1. Wall thickening along the ascending colon raises concern for acute infectious or inflammatory colitis, though it could be chronic in nature. 2. Herniation of a long segment of distal ileum into a large right inguinal hernia, with mild associated soft tissue inflammation and a small amount of free fluid. No evidence of bowel obstruction. 3. Findings of hepatic cirrhosis, with re- cannulization of the umbilical vein, and scattered gastric and splenic varices. 4. Small volume ascites within the abdomen and pelvis. 5. Scattered aortic  atherosclerosis. 6. Cholelithiasis, with mild gallbladder distention. Gallbladder otherwise unremarkable. Electronically Signed   By: Garald Balding M.D.   On: 11/21/2016 18:11    Review of Systems  Constitutional: Positive for weight loss. Negative for chills and fever.  HENT: Negative for hearing loss.   Eyes: Negative for blurred vision and double vision.  Respiratory: Negative for cough and hemoptysis.   Cardiovascular: Negative for chest pain and palpitations.  Gastrointestinal: Positive for abdominal pain and nausea. Negative for vomiting.  Genitourinary: Negative for dysuria and urgency.  Musculoskeletal: Negative for myalgias and neck pain.  Skin: Positive for itching and rash.  Neurological: Negative for dizziness, tingling and headaches.  Endo/Heme/Allergies: Does not bruise/bleed easily.  Psychiatric/Behavioral: Positive for substance abuse. Negative for depression and suicidal ideas.   Blood pressure 108/76, pulse 91, temperature 98.2 F (36.8 C), temperature source Oral, resp. rate 14, height '6\' 1"'  (1.854 m), weight 74.8  kg (165 lb), SpO2 95 %. Physical Exam  Vitals reviewed. Constitutional: He is oriented to person, place, and time. He appears well-developed and well-nourished.  HENT:  Head: Normocephalic and atraumatic.  Eyes: Conjunctivae and EOM are normal. Pupils are equal, round, and reactive to light. Scleral icterus is present.  Neck: Normal range of motion. Neck supple.  Cardiovascular: Normal rate and regular rhythm.   Respiratory: Effort normal and breath sounds normal.  GI: Soft. Bowel sounds are normal. He exhibits no distension. There is no tenderness.  Right inguinal hernia large into entire scrotum, soft easily reducible though contents immediately move back into the sac with cough or slight movement  Musculoskeletal: Normal range of motion.  Neurological: He is alert and oriented to person, place, and time.  Skin: Skin is warm and dry.  Psychiatric: He has a normal mood and affect. His behavior is normal.    Assessment/Plan: 66 yo male with reducible painful inguinal hernia. He continues to smoke and drink and given his evidence of cirrhosis on pathology in 2016 and his bilirubin elevation and mild encephalopathy on exam I am concerned that he is a fairly high risk for surgery. We discussed options of truss as well as coming into the hospital for pain control and further evaluation of his bilirubin elevation. -we will continue to follow along -we will try to get a truss while in the hospital  [place] 11/21/2016, 8:54 PM

## 2016-11-21 NOTE — ED Triage Notes (Signed)
Pt reports lifting a car battery yesterday and experiencing new pain on chronic R sided inguinal hernia.  Pt denies changes to bowels, no difficulty having BM's.  Resp e/u, NAD noted.

## 2016-11-21 NOTE — ED Notes (Signed)
Patient to CT.

## 2016-11-22 DIAGNOSIS — E872 Acidosis, unspecified: Secondary | ICD-10-CM | POA: Diagnosis present

## 2016-11-22 DIAGNOSIS — D509 Iron deficiency anemia, unspecified: Secondary | ICD-10-CM | POA: Diagnosis present

## 2016-11-22 DIAGNOSIS — K8020 Calculus of gallbladder without cholecystitis without obstruction: Secondary | ICD-10-CM | POA: Diagnosis present

## 2016-11-22 DIAGNOSIS — K921 Melena: Secondary | ICD-10-CM | POA: Diagnosis present

## 2016-11-22 LAB — VITAMIN B12: VITAMIN B 12: 3044 pg/mL — AB (ref 180–914)

## 2016-11-22 LAB — CBC
HCT: 25.3 % — ABNORMAL LOW (ref 39.0–52.0)
HEMOGLOBIN: 8.2 g/dL — AB (ref 13.0–17.0)
MCH: 25.4 pg — AB (ref 26.0–34.0)
MCHC: 32.4 g/dL (ref 30.0–36.0)
MCV: 78.3 fL (ref 78.0–100.0)
PLATELETS: 90 10*3/uL — AB (ref 150–400)
RBC: 3.23 MIL/uL — ABNORMAL LOW (ref 4.22–5.81)
RDW: 23.8 % — AB (ref 11.5–15.5)
WBC: 10 10*3/uL (ref 4.0–10.5)

## 2016-11-22 LAB — BILIRUBIN, FRACTIONATED(TOT/DIR/INDIR)
BILIRUBIN DIRECT: 3.7 mg/dL — AB (ref 0.1–0.5)
BILIRUBIN TOTAL: 8.5 mg/dL — AB (ref 0.3–1.2)
Indirect Bilirubin: 4.8 mg/dL — ABNORMAL HIGH (ref 0.3–0.9)

## 2016-11-22 LAB — COMPREHENSIVE METABOLIC PANEL
ALT: 18 U/L (ref 17–63)
AST: 41 U/L (ref 15–41)
Albumin: 2.5 g/dL — ABNORMAL LOW (ref 3.5–5.0)
Alkaline Phosphatase: 84 U/L (ref 38–126)
Anion gap: 10 (ref 5–15)
BUN: 9 mg/dL (ref 6–20)
CHLORIDE: 105 mmol/L (ref 101–111)
CO2: 18 mmol/L — AB (ref 22–32)
CREATININE: 0.9 mg/dL (ref 0.61–1.24)
Calcium: 7.7 mg/dL — ABNORMAL LOW (ref 8.9–10.3)
Glucose, Bld: 75 mg/dL (ref 65–99)
POTASSIUM: 3 mmol/L — AB (ref 3.5–5.1)
SODIUM: 133 mmol/L — AB (ref 135–145)
Total Bilirubin: 8 mg/dL — ABNORMAL HIGH (ref 0.3–1.2)
Total Protein: 6.8 g/dL (ref 6.5–8.1)

## 2016-11-22 LAB — LACTIC ACID, PLASMA
LACTIC ACID, VENOUS: 1.7 mmol/L (ref 0.5–1.9)
LACTIC ACID, VENOUS: 1.7 mmol/L (ref 0.5–1.9)

## 2016-11-22 LAB — BASIC METABOLIC PANEL
Anion gap: 6 (ref 5–15)
BUN: 9 mg/dL (ref 6–20)
CHLORIDE: 104 mmol/L (ref 101–111)
CO2: 22 mmol/L (ref 22–32)
CREATININE: 0.88 mg/dL (ref 0.61–1.24)
Calcium: 7.8 mg/dL — ABNORMAL LOW (ref 8.9–10.3)
GFR calc non Af Amer: >60 mL/min (ref >60)
GLUCOSE: 127 mg/dL — AB (ref 65–99)
Potassium: 3.2 mmol/L — ABNORMAL LOW (ref 3.5–5.1)
Sodium: 132 mmol/L — ABNORMAL LOW (ref 135–145)

## 2016-11-22 LAB — URINALYSIS, ROUTINE W REFLEX MICROSCOPIC
GLUCOSE, UA: NEGATIVE mg/dL
HGB URINE DIPSTICK: NEGATIVE
Ketones, ur: 5 mg/dL — AB
Leukocytes, UA: NEGATIVE
Nitrite: NEGATIVE
PROTEIN: NEGATIVE mg/dL
Specific Gravity, Urine: >1.046 — ABNORMAL HIGH (ref 1.005–1.030)
pH: 6 (ref 5.0–8.0)

## 2016-11-22 LAB — TYPE AND SCREEN
ABO/RH(D): A POS
Antibody Screen: NEGATIVE

## 2016-11-22 LAB — HEMOGLOBIN AND HEMATOCRIT, BLOOD
HCT: 22.1 % — ABNORMAL LOW (ref 39.0–52.0)
Hemoglobin: 7 g/dL — ABNORMAL LOW (ref 13.0–17.0)

## 2016-11-22 LAB — HIV ANTIBODY (ROUTINE TESTING W REFLEX): HIV SCREEN 4TH GENERATION: NONREACTIVE

## 2016-11-22 LAB — C-REACTIVE PROTEIN: CRP: 1.3 mg/dL — AB (ref <1.0)

## 2016-11-22 LAB — IRON AND TIBC
IRON: 40 ug/dL — AB (ref 45–182)
SATURATION RATIOS: 14 % — AB (ref 17.9–39.5)
TIBC: 290 ug/dL (ref 250–450)
UIBC: 250 ug/dL

## 2016-11-22 LAB — SEDIMENTATION RATE: SED RATE: 20 mm/h — AB (ref 0–16)

## 2016-11-22 LAB — RETICULOCYTES
RBC.: 3.23 MIL/uL — ABNORMAL LOW (ref 4.22–5.81)
Retic Count, Absolute: 135.7 10*3/uL (ref 19.0–186.0)
Retic Ct Pct: 4.2 % — ABNORMAL HIGH (ref 0.4–3.1)

## 2016-11-22 LAB — ABO/RH: ABO/RH(D): A POS

## 2016-11-22 LAB — MRSA PCR SCREENING: MRSA BY PCR: NEGATIVE

## 2016-11-22 LAB — FOLATE: FOLATE: 7.4 ng/mL (ref >5.9)

## 2016-11-22 LAB — MAGNESIUM: MAGNESIUM: 1.6 mg/dL — AB (ref 1.7–2.4)

## 2016-11-22 LAB — FERRITIN: Ferritin: 25 ng/mL (ref 24–336)

## 2016-11-22 MED ORDER — METRONIDAZOLE IN NACL 5-0.79 MG/ML-% IV SOLN
500.0000 mg | Freq: Three times a day (TID) | INTRAVENOUS | Status: DC
  Administered 2016-11-22 – 2016-11-28 (×20): 500 mg via INTRAVENOUS
  Filled 2016-11-22 (×21): qty 100

## 2016-11-22 MED ORDER — PNEUMOCOCCAL VAC POLYVALENT 25 MCG/0.5ML IJ INJ
0.5000 mL | INJECTION | INTRAMUSCULAR | Status: AC
  Administered 2016-11-25: 0.5 mL via INTRAMUSCULAR
  Filled 2016-11-22: qty 0.5

## 2016-11-22 MED ORDER — POTASSIUM CHLORIDE CRYS ER 20 MEQ PO TBCR
40.0000 meq | EXTENDED_RELEASE_TABLET | Freq: Once | ORAL | Status: AC
Start: 2016-11-22 — End: 2016-11-22
  Administered 2016-11-22: 40 meq via ORAL
  Filled 2016-11-22: qty 2

## 2016-11-22 MED ORDER — CIPROFLOXACIN IN D5W 400 MG/200ML IV SOLN
400.0000 mg | Freq: Two times a day (BID) | INTRAVENOUS | Status: DC
  Administered 2016-11-22 – 2016-11-28 (×13): 400 mg via INTRAVENOUS
  Filled 2016-11-22 (×14): qty 200

## 2016-11-22 MED ORDER — SODIUM CHLORIDE 0.9 % IV SOLN
1.0000 g | Freq: Once | INTRAVENOUS | Status: AC
  Administered 2016-11-22: 1 g via INTRAVENOUS
  Filled 2016-11-22: qty 10

## 2016-11-22 MED ORDER — MAGNESIUM SULFATE 2 GM/50ML IV SOLN
2.0000 g | Freq: Once | INTRAVENOUS | Status: AC
  Administered 2016-11-22: 2 g via INTRAVENOUS
  Filled 2016-11-22: qty 50

## 2016-11-22 MED ORDER — MAGNESIUM SULFATE 2 GM/50ML IV SOLN: 2.0000 g | Freq: Once | INTRAVENOUS | Status: DC

## 2016-11-22 NOTE — Progress Notes (Addendum)
Patient ID: Alfred [name], male   DOB: 02-19-1951, 66 y.o.   MRN: [id number]    PROGRESS NOTE    Alfred [name]  NWG:[id number] DOB: 02-19-1951 DOA: 11/21/2016  PCP: Patient, No Pcp Per   Brief Narrative:  Pt is [age] male with known hx of alcohol abuse, hep C, alcoholic cirrhosis, tobacco abuse, presented with right groin pain. On admission, imaging studies worrisome for herniation of a long segment of distal ileum into a large right inguinal hernia, no evidence of bowel obstruction. Surgery consulted.   Assessment & Plan:   Principal Problem:   Right inguinal hernia - with no evidence of obstruction - per surgery team, pt is at high risk for surgical intervention given ongoing alcohol use with known liver cirrhosis - pt is somewhat confused this AM and somnolent so unable to fully discuss GOC and recommendations, awaiting for family to come to have discussion - allow analgesia as needed for pain control     ? Ascending colitis  - noted on CT abd  - was started on empiric Cipro and Flagyl, continue for now   Active Problems:   Alcohol abuse, acute metabolic encephalopathy  - ongoing use - keep on CIWA for now     Cirrhosis, alcoholic (HCC), Hep C, transaminitis, coagulopathy  - high risk for surgical interventions - ongoing alcohol use     Anemia of chronic disease, thrombocytopenia, now presented with melena  - in the setting of alcoholic cirrhosis, alcohol induced bone marrow damage  - no BM this AM, monitor for blood in stool  - transfuse only if Hg < 7 - may need GI consult  - SCD's for DVT prophylaxis     Hypokalemia and hypomagnesemia - supplement - repeat BMP and Mg levels in AM    Hyponatremia - from alcohol use and volume status - monitor    DVT prophylaxis: SCD Code Status: Full  Family Communication: Patient at bedside  Disposition Plan: to be determined   Consultants:   Surgery   Procedures:   None  Antimicrobials:   Cipro 6/19 --<  Flagyl  6/19 -->  Subjective: Hg drop overnight, no over bleeding reported.   Objective: Vitals:   11/22/16 0500 11/22/16 0600 11/22/16 0734 11/22/16 1219  BP: (!) 113/97 112/67 108/62 117/79  Pulse: 87 87 89 87  Resp: 16 16 20 17   Temp:   97.7 F (36.5 C) 97.7 F (36.5 C)  TempSrc:   Axillary Oral  SpO2: 90% 93% 94% 94%  Weight:      Height:        Intake/Output Summary (Last 24 hours) at 11/22/16 1330 Last data filed at 11/22/16 1008  Gross per 24 hour  Intake              403 ml  Output              225 ml  Net              178 ml   Filed Weights   11/21/16 1227 11/22/16 0037  Weight: 74.8 kg (165 lb) 63.2 kg (139 lb 4.8 oz)    Examination:  General exam: Appears calm but somnolent  Respiratory system: Clear to auscultation. Respiratory effort normal. Cardiovascular system: S1 & S2 heard, RRR. No JVD, murmurs, rubs, gallops or clicks. No pedal edema. Gastrointestinal system: Abdomen is nondistended, soft and nontender. Central nervous system: somnolent but able to move all 4 extremities spont   Data Reviewed: I have  personally reviewed following labs and imaging studies  CBC:  Recent Labs Lab 11/21/16 1636 11/22/16 0119 11/22/16 0553  WBC 10.2 10.0  --   NEUTROABS 7.2  --   --   HGB 8.1* 8.2* 7.0*  HCT 24.8* 25.3* 22.1*  MCV 79.0 78.3  --   PLT 103* 90*  --    Basic Metabolic Panel:  Recent Labs Lab 11/21/16 1636 11/22/16 0119 11/22/16 1144  NA 133* 133* 132*  K 3.0* 3.0* 3.2*  CL 105 105 104  CO2 22 18* 22  GLUCOSE 104* 75 127*  BUN 10 9 9   CREATININE 0.94 0.90 0.88  CALCIUM 8.0* 7.7* 7.8*  MG  --  1.6*  --    Liver Function Tests:  Recent Labs Lab 11/21/16 1636 11/22/16 0119  AST 52* 41  ALT 22 18  ALKPHOS 89 84  BILITOT 8.9* 8.0*  8.5*  PROT 7.5 6.8  ALBUMIN 2.6* 2.5*    Recent Labs Lab 11/21/16 1636  LIPASE 30    Recent Labs Lab 11/21/16 2112  AMMONIA 50*   Coagulation Profile:  Recent Labs Lab 11/21/16 2024  INR  1.81   Anemia Panel:  Recent Labs  11/22/16 0119  VITAMINB12 3,044*  FOLATE 7.4  FERRITIN 25  TIBC 290  IRON 40*  RETICCTPCT 4.2*   Urine analysis:    Component Value Date/Time   COLORURINE YELLOW 12/21/2014 0829   APPEARANCEUR CLEAR 12/21/2014 0829   LABSPEC 1.013 12/21/2014 0829   PHURINE 8.0 12/21/2014 0829   GLUCOSEU NEGATIVE 12/21/2014 0829   HGBUR NEGATIVE 12/21/2014 0829   BILIRUBINUR NEGATIVE 12/21/2014 0829   KETONESUR NEGATIVE 12/21/2014 0829   PROTEINUR NEGATIVE 12/21/2014 0829   UROBILINOGEN 0.2 12/21/2014 0829   NITRITE NEGATIVE 12/21/2014 0829   LEUKOCYTESUR NEGATIVE 12/21/2014 0829   Recent Results (from the past 240 hour(s))  MRSA PCR Screening     Status: None   Collection Time: 11/22/16 12:47 AM  Result Value Ref Range Status   MRSA by PCR NEGATIVE NEGATIVE Final    Comment:        The GeneXpert MRSA Assay (FDA approved for NASAL specimens only), is one component of a comprehensive MRSA colonization surveillance program. It is not intended to diagnose MRSA infection nor to guide or monitor treatment for MRSA infections.       Radiology Studies: Ct Abdomen Pelvis W Contrast  Result Date: 11/21/2016 CLINICAL DATA:  Acute onset of right inguinal pain after lifting battery. Initial encounter. EXAM: CT ABDOMEN AND PELVIS WITH CONTRAST TECHNIQUE: Multidetector CT imaging of the abdomen and pelvis was performed using the standard protocol following bolus administration of intravenous contrast. CONTRAST:  ISOVUE-300 IOPAMIDOL (ISOVUE-300) INJECTION 61% COMPARISON:  Right upper quadrant ultrasound performed 12/23/2014, and CT of the abdomen and pelvis performed 12/12/2014 FINDINGS: Lower chest: Minimal bibasilar atelectasis is noted. The visualized portions of the mediastinum are unremarkable. Hepatobiliary: There is diffuse nodularity of the liver, with associated heterogeneity, reflecting hepatic cirrhosis and venous congestion. There is  recanalization of the umbilical vein. Scattered gastric and splenic varices are noted. The gallbladder is mildly distended, and contains several small stones. The common bile duct remains normal in caliber. Pancreas: The pancreas is within normal limits. Spleen: The spleen is unremarkable in appearance. Adrenals/Urinary Tract: The adrenal glands are unremarkable in appearance. The kidneys are within normal limits. There is no evidence of hydronephrosis. No renal or ureteral stones are identified. Mild left-sided nonspecific perinephric stranding is noted. Stomach/Bowel: The stomach is unremarkable  in appearance. There is herniation of a long segment of distal ileum into a large right inguinal hernia, with mild associated soft tissue edema and a small amount of free fluid. There is no evidence of bowel obstruction. The appendix is normal in caliber, without evidence of appendicitis. Wall thickening along the ascending colon raises concern for an acute infectious or inflammatory colitis. Vascular/Lymphatic: Scattered calcification is seen along the distal abdominal aorta and its branches. The abdominal aorta is otherwise grossly unremarkable. The inferior vena cava is grossly unremarkable. No retroperitoneal lymphadenopathy is seen. No pelvic sidewall lymphadenopathy is identified. Reproductive: The bladder is mildly distended and grossly unremarkable. The prostate remains normal in size. Other: Small volume ascites is seen within the abdomen and pelvis. Musculoskeletal: No acute osseous abnormalities are identified. The visualized musculature is unremarkable in appearance. IMPRESSION: 1. Wall thickening along the ascending colon raises concern for acute infectious or inflammatory colitis, though it could be chronic in nature. 2. Herniation of a long segment of distal ileum into a large right inguinal hernia, with mild associated soft tissue inflammation and a small amount of free fluid. No evidence of bowel  obstruction. 3. Findings of hepatic cirrhosis, with re- cannulization of the umbilical vein, and scattered gastric and splenic varices. 4. Small volume ascites within the abdomen and pelvis. 5. Scattered aortic atherosclerosis. 6. Cholelithiasis, with mild gallbladder distention. Gallbladder otherwise unremarkable. Electronically Signed   By: Roanna Raider M.D.   On: 11/21/2016 18:11   US Abdomen Limited Ruq  Result Date: 11/21/2016 CLINICAL DATA:  66 y/o  M; cholelithiasis. EXAM: ULTRASOUND ABDOMEN LIMITED RIGHT UPPER QUADRANT COMPARISON:  11/21/2016 CT of the abdomen and pelvis. FINDINGS: Gallbladder: Negative sonographic Murphy sign. Gallstones measuring up to 3 mm. Mild gallbladder wall thickening and pericholecystic fluid. Common bile duct: Diameter: 3 mm Liver: Cirrhotic configuration of the liver. Small volume of perihepatic ascites. IMPRESSION: 1. Cholelithiasis. 2. Mild gallbladder wall thickening and pericholecystic fluid is indeterminate for acute cholecystitis in the setting of ascites. Negative sonographic Murphy's sign. 3. Liver cirrhosis. Electronically Signed   By: Mitzi Hansen M.D.   On: 11/21/2016 22:57    Scheduled Meds: . folic acid  1 mg Oral Daily  . LORazepam  0-4 mg Intravenous Q6H   Followed by  . [START ON 11/24/2016] LORazepam  0-4 mg Intravenous Q12H  . multivitamin with minerals  1 tablet Oral Daily  . nicotine  21 mg Transdermal Daily  . pantoprazole (PROTONIX) IV  40 mg Intravenous Q12H  . [START ON 11/23/2016] pneumococcal 23 valent vaccine  0.5 mL Intramuscular Tomorrow-1000  . sodium chloride flush  3 mL Intravenous Q12H  . thiamine  100 mg Oral Daily   Or  . thiamine  100 mg Intravenous Daily   Continuous Infusions: . sodium chloride    . ciprofloxacin Stopped (11/22/16 0751)  . metronidazole Stopped (11/22/16 0751)     LOS: 1 day   Time spent: 35 minutes   Debbora Presto, MD [facility] Pager [phone]  If 7PM-7AM, please  contact night-coverage [email or web address] Password [email or web address] 11/22/2016, 1:30 PM

## 2016-11-22 NOTE — Plan of Care (Signed)
Problem: Education: Goal: Knowledge of [facility] Education information/materials will improve Outcome: Progressing Patient oriented to hospital and unit. Call light given and instructed patient on use. Patient using 0/10 pain rating scale. Updated on plan of care and explained use of CIWA scale. Patient given nicotine patch and reminded that he should quit smoking. Verbalizes understanding.   Problem: Safety: Goal: Ability to remain free from injury will improve Outcome: Progressing Call light within reach and bed alarm being utilized. Patient instructed to call when he wants to get up for safety. He is agreeable to this at this time.   Problem: Health Behavior/Discharge Planning: Goal: Ability to manage health-related needs will improve Outcome: Progressing Patient lives with friends. States he has a power of attorney-his sister. Asked patient to bring it in.   Problem: Pain Managment: Goal: General experience of comfort will improve Outcome: Progressing Patient states he has had intermittent pain in his groin but that he has been able to handle it. Advised to call for pain medicine if he needs it.   Problem: Physical Regulation: Goal: Ability to maintain clinical measurements within normal limits will improve Outcome: Progressing Patient's urine was reddish/orange. MD made aware. Patient being followed by general surgery for his inguinal hernia.   Problem: Skin Integrity: Goal: Risk for impaired skin integrity will decrease Outcome: Progressing Patient body covered in small round bites. Patient thinks they might be bed bug related. They are open as he has been scratching. No evidence of skin breakdown over bony prominences; however, foam placed for prevention.   Problem: Tissue Perfusion: Goal: Risk factors for ineffective tissue perfusion will decrease Outcome: Progressing SCD's placed. Educated on reason.   Problem: Nutrition: Goal: Adequate nutrition will be  maintained Outcome: Progressing Patient on a full liquid diet. Tolerating well.

## 2016-11-22 NOTE — Progress Notes (Signed)
[facility]/Trauma Progress Note      Subjective: CC: right groin pain  Pt states his pain is improved since admission. He is not having abdominal pain, nausea, vomiting. Pt is having flatus. No new complaints.   Objective: Vital signs in last 24 hours: Temp:  [97.7 F (36.5 C)-98.2 F (36.8 C)] 97.7 F (36.5 C) (06/20 0734) Pulse Rate:  [74-107] 89 (06/20 0734) Resp:  [12-21] 20 (06/20 0734) BP: (103-141)/(62-98) 108/62 (06/20 0734) SpO2:  [90 %-100 %] 94 % (06/20 0734) Weight:  [139 lb 4.8 oz (63.2 kg)-165 lb (74.8 kg)] 139 lb 4.8 oz (63.2 kg) (06/20 0037) Last BM Date: 11/21/16  Intake/Output from previous day: 06/19 0701 - 06/20 0700 In: 163 [I.V.:3; IV Piggyback:160] Out: 225 [Urine:225] Intake/Output this shift: No intake/output data recorded.  PE: Gen:  Alert, NAD Eyes: Conjunctivae normal. Pupils are equal. Scleral icterus is present Pulm:  Rate and effort normal, clear breath sounds Abd: Soft, mildly distended, +BS, very mild generalized TTP to abdomen, large right inguinal hernia that is mildly tender on exam Skin: warm and dry  Lab Results:   Recent Labs  11/21/16 1636 11/22/16 0119 11/22/16 0553  WBC 10.2 10.0  --   HGB 8.1* 8.2* 7.0*  HCT 24.8* 25.3* 22.1*  PLT 103* 90*  --    BMET  Recent Labs  11/21/16 1636 11/22/16 0119  NA 133* 133*  K 3.0* 3.0*  CL 105 105  CO2 22 18*  GLUCOSE 104* 75  BUN 10 9  CREATININE 0.94 0.90  CALCIUM 8.0* 7.7*   PT/INR  Recent Labs  11/21/16 2024  LABPROT 21.2*  INR 1.81   CMP     Component Value Date/Time   NA 133 (L) 11/22/2016 0119   K 3.0 (L) 11/22/2016 0119   CL 105 11/22/2016 0119   CO2 18 (L) 11/22/2016 0119   GLUCOSE 75 11/22/2016 0119   BUN 9 11/22/2016 0119   CREATININE 0.90 11/22/2016 0119   CALCIUM 7.7 (L) 11/22/2016 0119   PROT 6.8 11/22/2016 0119   ALBUMIN 2.5 (L) 11/22/2016 0119   AST 41 11/22/2016 0119   ALT 18 11/22/2016 0119   ALKPHOS 84 11/22/2016 0119    BILITOT 8.0 (H) 11/22/2016 0119   BILITOT 8.5 (H) 11/22/2016 0119   GFRNONAA >60 11/22/2016 0119   GFRAA >60 11/22/2016 0119   Lipase     Component Value Date/Time   LIPASE 30 11/21/2016 1636    Studies/Results: Ct Abdomen Pelvis W Contrast  Result Date: 11/21/2016 CLINICAL DATA:  Acute onset of right inguinal pain after lifting battery. Initial encounter. EXAM: CT ABDOMEN AND PELVIS WITH CONTRAST TECHNIQUE: Multidetector CT imaging of the abdomen and pelvis was performed using the standard protocol following bolus administration of intravenous contrast. CONTRAST:  100mL ISOVUE-300 IOPAMIDOL (ISOVUE-300) INJECTION 61% COMPARISON:  Right upper quadrant ultrasound performed 12/23/2014, and CT of the abdomen and pelvis performed 12/12/2014 FINDINGS: Lower chest: Minimal bibasilar atelectasis is noted. The visualized portions of the mediastinum are unremarkable. Hepatobiliary: There is diffuse nodularity of the liver, with associated heterogeneity, reflecting hepatic cirrhosis and venous congestion. There is recanalization of the umbilical vein. Scattered gastric and splenic varices are noted. The gallbladder is mildly distended, and contains several small stones. The common bile duct remains normal in caliber. Pancreas: The pancreas is within normal limits. Spleen: The spleen is unremarkable in appearance. Adrenals/Urinary Tract: The adrenal glands are unremarkable in appearance. The kidneys are within normal limits. There is no evidence of hydronephrosis.  No renal or ureteral stones are identified. Mild left-sided nonspecific perinephric stranding is noted. Stomach/Bowel: The stomach is unremarkable in appearance. There is herniation of a long segment of distal ileum into a large right inguinal hernia, with mild associated soft tissue edema and a small amount of free fluid. There is no evidence of bowel obstruction. The appendix is normal in caliber, without evidence of appendicitis. Wall thickening  along the ascending colon raises concern for an acute infectious or inflammatory colitis. Vascular/Lymphatic: Scattered calcification is seen along the distal abdominal aorta and its branches. The abdominal aorta is otherwise grossly unremarkable. The inferior vena cava is grossly unremarkable. No retroperitoneal lymphadenopathy is seen. No pelvic sidewall lymphadenopathy is identified. Reproductive: The bladder is mildly distended and grossly unremarkable. The prostate remains normal in size. Other: Small volume ascites is seen within the abdomen and pelvis. Musculoskeletal: No acute osseous abnormalities are identified. The visualized musculature is unremarkable in appearance. IMPRESSION: 1. Wall thickening along the ascending colon raises concern for acute infectious or inflammatory colitis, though it could be chronic in nature. 2. Herniation of a long segment of distal ileum into a large right inguinal hernia, with mild associated soft tissue inflammation and a small amount of free fluid. No evidence of bowel obstruction. 3. Findings of hepatic cirrhosis, with re- cannulization of the umbilical vein, and scattered gastric and splenic varices. 4. Small volume ascites within the abdomen and pelvis. 5. Scattered aortic atherosclerosis. 6. Cholelithiasis, with mild gallbladder distention. Gallbladder otherwise unremarkable. Electronically Signed   By: Roanna Raider M.D.   On: 11/21/2016 18:11   US Abdomen Limited Ruq  Result Date: 11/21/2016 CLINICAL DATA:  66 y/o  M; cholelithiasis. EXAM: ULTRASOUND ABDOMEN LIMITED RIGHT UPPER QUADRANT COMPARISON:  11/21/2016 CT of the abdomen and pelvis. FINDINGS: Gallbladder: Negative sonographic Murphy sign. Gallstones measuring up to 3 mm. Mild gallbladder wall thickening and pericholecystic fluid. Common bile duct: Diameter: 3 mm Liver: Cirrhotic configuration of the liver. Small volume of perihepatic ascites. IMPRESSION: 1. Cholelithiasis. 2. Mild gallbladder wall  thickening and pericholecystic fluid is indeterminate for acute cholecystitis in the setting of ascites. Negative sonographic Murphy's sign. 3. Liver cirrhosis. Electronically Signed   By: Mitzi Hansen M.D.   On: 11/21/2016 22:57    Anti-infectives: Anti-infectives    Start     Dose/Rate Route Frequency Ordered Stop   11/22/16 0630  metroNIDAZOLE (FLAGYL) IVPB 500 mg     500 mg 100 mL/hr over 60 Minutes Intravenous Every 8 hours 11/22/16 0625     11/22/16 0630  ciprofloxacin (CIPRO) IVPB 400 mg     400 mg 200 mL/hr over 60 Minutes Intravenous Every 12 hours 11/22/16 9604         Assessment/Plan  Hyperbilirubinemia Melena transaminitis  Hypokalemia mycroctic anemia ETOH abuse with h/o delirium tremens Tobacco abuse Thrombocytopenia Coagulopathy Alcoholic liver cirrhosis Hep C  Right inguinal hernia - distal ileum seen in large right inguinal hernia on CT - pt's co morbidities make him a poor surgical candidate   FEN: full liquids VTE: SCD's ID: Cipro & Flagyl 06/20>>  DISPO: No surgery at this time, since hernia is reducible and no incarceration is noted we recommend conservative measures at this time. Try to get pt a truss. Will follow along.    LOS: 1 day    Jerre Simon , [facility] 11/22/2016, 8:16 AM Pager: [phone] Consults: [phone] Mon-Fri 7:00 am-4:30 pm Sat-Sun 7:00 am-11:30 am

## 2016-11-22 NOTE — Care Management Note (Addendum)
Case Management Note  Patient Details  Name: Alfred [name] MRN: [id number] Date of Birth: [birth year]  Subjective/Objective:  From home with friends, presents with worsening right groin hernia pain, has hx of poorly controlled cirrhosis and ongoing etoh abuse. Per surgery, No surgery at this time, since hernia is reducible and no incarceration is noted they  recommend conservative measures at this time. He hs Medicaid insurance.  PCP - Will need to schedule apt with [facility] [phone]                   Action/Plan: NCM will follow for dc needs.  Expected Discharge Date:                  Expected Discharge Plan:     In-House Referral:     Discharge planning Services  CM Consult  Post Acute Care Choice:    Choice offered to:     DME Arranged:    DME Agency:     HH Arranged:    HH Agency:     Status of Service:  In process, will continue to follow  If discussed at Long Length of Stay Meetings, dates discussed:    Additional Comments:  Leone Havenaylor, Anett Ranker [place], RN 11/22/2016, 12:27 PM

## 2016-11-23 LAB — COMPREHENSIVE METABOLIC PANEL
ALBUMIN: 2.3 g/dL — AB (ref 3.5–5.0)
ALT: 18 U/L (ref 17–63)
AST: 46 U/L — AB (ref 15–41)
Alkaline Phosphatase: 87 U/L (ref 38–126)
Anion gap: 5 (ref 5–15)
BILIRUBIN TOTAL: 4.6 mg/dL — AB (ref 0.3–1.2)
BUN: 9 mg/dL (ref 6–20)
CHLORIDE: 104 mmol/L (ref 101–111)
CO2: 22 mmol/L (ref 22–32)
Calcium: 7.8 mg/dL — ABNORMAL LOW (ref 8.9–10.3)
Creatinine, Ser: 0.81 mg/dL (ref 0.61–1.24)
GFR calc Af Amer: >60 mL/min (ref >60)
GFR calc non Af Amer: >60 mL/min (ref >60)
GLUCOSE: 67 mg/dL (ref 65–99)
POTASSIUM: 4.2 mmol/L (ref 3.5–5.1)
SODIUM: 131 mmol/L — AB (ref 135–145)
Total Protein: 6.3 g/dL — ABNORMAL LOW (ref 6.5–8.1)

## 2016-11-23 LAB — CBC
HEMATOCRIT: 21.2 % — AB (ref 39.0–52.0)
HEMOGLOBIN: 7 g/dL — AB (ref 13.0–17.0)
MCH: 25.4 pg — AB (ref 26.0–34.0)
MCHC: 33 g/dL (ref 30.0–36.0)
MCV: 76.8 fL — AB (ref 78.0–100.0)
Platelets: 106 10*3/uL — ABNORMAL LOW (ref 150–400)
RBC: 2.76 MIL/uL — ABNORMAL LOW (ref 4.22–5.81)
RDW: 23.6 % — AB (ref 11.5–15.5)
WBC: 8.2 10*3/uL (ref 4.0–10.5)

## 2016-11-23 MED ORDER — ORAL CARE MOUTH RINSE
15.0000 mL | Freq: Two times a day (BID) | OROMUCOSAL | Status: DC
  Administered 2016-11-23: 15 mL via OROMUCOSAL

## 2016-11-23 MED ORDER — CHLORHEXIDINE GLUCONATE 0.12 % MT SOLN
15.0000 mL | Freq: Two times a day (BID) | OROMUCOSAL | Status: DC
  Administered 2016-11-23 – 2016-11-29 (×12): 15 mL via OROMUCOSAL
  Filled 2016-11-23 (×12): qty 15

## 2016-11-23 MED ORDER — VITAMIN B-1 100 MG PO TABS
100.0000 mg | ORAL_TABLET | Freq: Every day | ORAL | Status: DC
Start: 2016-11-24 — End: 2016-11-30
  Administered 2016-11-24 – 2016-11-29 (×6): 100 mg via ORAL
  Filled 2016-11-23 (×6): qty 1

## 2016-11-23 MED ORDER — PANTOPRAZOLE SODIUM 40 MG PO TBEC
40.0000 mg | DELAYED_RELEASE_TABLET | Freq: Two times a day (BID) | ORAL | Status: DC
  Administered 2016-11-23 – 2016-11-29 (×13): 40 mg via ORAL
  Filled 2016-11-23 (×13): qty 1

## 2016-11-23 NOTE — Progress Notes (Signed)
Patient ID: Alfred [name], male   DOB: 02/19/1951, 66 y.o.   MRN: [id number]    PROGRESS NOTE  Alfred [name]  [id number] DOB: 02/19/1951 DOA: 11/21/2016  PCP: Patient, No Pcp Per   Brief Narrative:  Pt is [id number] male with known hx of alcohol abuse, hep C, alcoholic cirrhosis, tobacco abuse, presented with right groin pain. On admission, imaging studies worrisome for herniation of a long segment of distal ileum into a large right inguinal hernia, no evidence of bowel obstruction. Surgery consulted.   Assessment & Plan:   Principal Problem:   Right inguinal hernia - no evidence of obstruction - per surgery team, no indication for urgent surgical intervention at this time but if pt would need this, tertiary medical center recommended  - allow analgesia as needed - I have discussed current medical dx with pt'[name] POA sister, she says that pt was told in the past he is not a surgical candidate as he continues to drink and would not want surgery     ? Ascending colitis  - noted on CT abd  - continue with Cipro and Flagyl day #3  Active Problems:   Alcohol abuse, acute metabolic encephalopathy  - keep on CIWA protocol     Cirrhosis, alcoholic (HCC), Hep C, transaminitis, coagulopathy  - high risk for surgical interventions - ongoing alcohol use     Anemia of chronic disease, thrombocytopenia, now presented with melena  - in the setting of alcoholic cirrhosis, alcohol induced bone marrow damage  - Hg overall unchanged ~7 - transfuse for Hg <7, consider GI consult if recurrent bleed  - Plt count overall improving  - CBC in AM    Hypokalemia and hypomagnesemia - both electrolytes supplemented, will repeat BMP and Mg level in AM    Hyponatremia - slightly down since admission - encouraged oral intake  - will repeat BMP in AM  DVT prophylaxis: SCD Code Status: Full  Family Communication: pt at bedside, sister updated over the phone  Disposition Plan: to be determined    Consultants:   Surgery   Procedures:   None  Antimicrobials:   Cipro 6/19 --<  Flagyl 6/19 -->  Subjective: Pt reports feeling better, denies blood in stool, no chest pain.   Objective: Vitals:   11/23/16 0400 11/23/16 0413 11/23/16 0815 11/23/16 1137  BP: 117/82 117/82 110/79 104/62  Pulse: 95 90 92 93  Resp: (!) 33 19 (!) 24 (!) 21  Temp:  98.3 F (36.8 C) 98.3 F (36.8 C) 98.3 F (36.8 C)  TempSrc:  Oral Oral Oral  SpO2: 96% 97% 95% 99%  Weight:      Height:        Intake/Output Summary (Last 24 hours) at 11/23/16 1534 Last data filed at 11/23/16 1400  Gross per 24 hour  Intake             1140 ml  Output              400 ml  Net              740 ml   Filed Weights   11/21/16 1227 11/22/16 0037  Weight: 74.8 kg (165 lb) 63.2 kg (139 lb 4.8 oz)    Physical Exam  Constitutional: Appears chronically ill, NAD CVS: RRR, S1/S2 +, no murmurs, no gallops, no carotid bruit.  Pulmonary: Effort and breath sounds normal, no stridor, rhonchi, wheezes, rales.  Abdominal: Soft. BS +,  no distension, tenderness, rebound or  guarding.  Musculoskeletal: Normal range of motion. No edema and no tenderness.   Data Reviewed: I have personally reviewed following labs and imaging studies  CBC:  Recent Labs Lab 11/21/16 1636 11/22/16 0119 11/22/16 0553 11/23/16 0441  WBC 10.2 10.0  --  8.2  NEUTROABS 7.2  --   --   --   HGB 8.1* 8.2* 7.0* 7.0*  HCT 24.8* 25.3* 22.1* 21.2*  MCV 79.0 78.3  --  76.8*  PLT 103* 90*  --  106*   Basic Metabolic Panel:  Recent Labs Lab 11/21/16 1636 11/22/16 0119 11/22/16 1144 11/23/16 0441  NA 133* 133* 132* 131*  K 3.0* 3.0* 3.2* 4.2  CL 105 105 104 104  CO2 22 18* 22 22  GLUCOSE 104* 75 127* 67  BUN 10 9 9 9   CREATININE 0.94 0.90 0.88 0.81  CALCIUM 8.0* 7.7* 7.8* 7.8*  MG  --  1.6*  --   --    Liver Function Tests:  Recent Labs Lab 11/21/16 1636 11/22/16 0119 11/23/16 0441  AST 52* 41 46*  ALT 22 18 18   ALKPHOS  89 84 87  BILITOT 8.9* 8.0*  8.5* 4.6*  PROT 7.5 6.8 6.3*  ALBUMIN 2.6* 2.5* 2.3*    Recent Labs Lab 11/21/16 1636  LIPASE 30    Recent Labs Lab 11/21/16 2112  AMMONIA 50*   Coagulation Profile:  Recent Labs Lab 11/21/16 2024  INR 1.81   Anemia Panel:  Recent Labs  11/22/16 0119  VITAMINB12 3,044*  FOLATE 7.4  FERRITIN 25  TIBC 290  IRON 40*  RETICCTPCT 4.2*   Urine analysis:    Component Value Date/Time   COLORURINE AMBER (A) 11/22/2016 1550   APPEARANCEUR CLEAR 11/22/2016 1550   LABSPEC >1.046 (H) 11/22/2016 1550   PHURINE 6.0 11/22/2016 1550   GLUCOSEU NEGATIVE 11/22/2016 1550   HGBUR NEGATIVE 11/22/2016 1550   BILIRUBINUR SMALL (A) 11/22/2016 1550   KETONESUR 5 (A) 11/22/2016 1550   PROTEINUR NEGATIVE 11/22/2016 1550   UROBILINOGEN 0.2 12/21/2014 0829   NITRITE NEGATIVE 11/22/2016 1550   LEUKOCYTESUR NEGATIVE 11/22/2016 1550   Recent Results (from the past 240 hour([name]))  MRSA PCR Screening     Status: None   Collection Time: 11/22/16 12:47 AM  Result Value Ref Range Status   MRSA by PCR NEGATIVE NEGATIVE Final    Comment:        The GeneXpert MRSA Assay (FDA approved for NASAL specimens only), is one component of a comprehensive MRSA colonization surveillance program. It is not intended to diagnose MRSA infection nor to guide or monitor treatment for MRSA infections.       Radiology Studies: Ct Abdomen Pelvis W Contrast  Result Date: 11/21/2016 CLINICAL DATA:  Acute onset of right inguinal pain after lifting battery. Initial encounter. EXAM: CT ABDOMEN AND PELVIS WITH CONTRAST TECHNIQUE: Multidetector CT imaging of the abdomen and pelvis was performed using the standard protocol following bolus administration of intravenous contrast. CONTRAST:  ISOVUE-300 IOPAMIDOL (ISOVUE-300) INJECTION 61% COMPARISON:  Right upper quadrant ultrasound performed 12/23/2014, and CT of the abdomen and pelvis performed 12/12/2014 FINDINGS: Lower chest:  Minimal bibasilar atelectasis is noted. The visualized portions of the mediastinum are unremarkable. Hepatobiliary: There is diffuse nodularity of the liver, with associated heterogeneity, reflecting hepatic cirrhosis and venous congestion. There is recanalization of the umbilical vein. Scattered gastric and splenic varices are noted. The gallbladder is mildly distended, and contains several small stones. The common bile duct remains normal in caliber. Pancreas:  The pancreas is within normal limits. Spleen: The spleen is unremarkable in appearance. Adrenals/Urinary Tract: The adrenal glands are unremarkable in appearance. The kidneys are within normal limits. There is no evidence of hydronephrosis. No renal or ureteral stones are identified. Mild left-sided nonspecific perinephric stranding is noted. Stomach/Bowel: The stomach is unremarkable in appearance. There is herniation of a long segment of distal ileum into a large right inguinal hernia, with mild associated soft tissue edema and a small amount of free fluid. There is no evidence of bowel obstruction. The appendix is normal in caliber, without evidence of appendicitis. Wall thickening along the ascending colon raises concern for an acute infectious or inflammatory colitis. Vascular/Lymphatic: Scattered calcification is seen along the distal abdominal aorta and its branches. The abdominal aorta is otherwise grossly unremarkable. The inferior vena cava is grossly unremarkable. No retroperitoneal lymphadenopathy is seen. No pelvic sidewall lymphadenopathy is identified. Reproductive: The bladder is mildly distended and grossly unremarkable. The prostate remains normal in size. Other: Small volume ascites is seen within the abdomen and pelvis. Musculoskeletal: No acute osseous abnormalities are identified. The visualized musculature is unremarkable in appearance. IMPRESSION: 1. Wall thickening along the ascending colon raises concern for acute infectious or  inflammatory colitis, though it could be chronic in nature. 2. Herniation of a long segment of distal ileum into a large right inguinal hernia, with mild associated soft tissue inflammation and a small amount of free fluid. No evidence of bowel obstruction. 3. Findings of hepatic cirrhosis, with re- cannulization of the umbilical vein, and scattered gastric and splenic varices. 4. Small volume ascites within the abdomen and pelvis. 5. Scattered aortic atherosclerosis. 6. Cholelithiasis, with mild gallbladder distention. Gallbladder otherwise unremarkable. Electronically Signed   By: Roanna Raider M.D.   On: 11/21/2016 18:11   US Abdomen Limited Ruq  Result Date: 11/21/2016 CLINICAL DATA:  66 y/o  M; cholelithiasis. EXAM: ULTRASOUND ABDOMEN LIMITED RIGHT UPPER QUADRANT COMPARISON:  11/21/2016 CT of the abdomen and pelvis. FINDINGS: Gallbladder: Negative sonographic Murphy sign. Gallstones measuring up to 3 mm. Mild gallbladder wall thickening and pericholecystic fluid. Common bile duct: Diameter: 3 mm Liver: Cirrhotic configuration of the liver. Small volume of perihepatic ascites. IMPRESSION: 1. Cholelithiasis. 2. Mild gallbladder wall thickening and pericholecystic fluid is indeterminate for acute cholecystitis in the setting of ascites. Negative sonographic Murphy'[name] sign. 3. Liver cirrhosis. Electronically Signed   By: Mitzi Hansen M.D.   On: 11/21/2016 22:57    Scheduled Meds: . chlorhexidine  15 mL Mouth Rinse BID  . folic acid  1 mg Oral Daily  . LORazepam  0-4 mg Intravenous Q6H   Followed by  . [START ON 11/24/2016] LORazepam  0-4 mg Intravenous Q12H  . mouth rinse  15 mL Mouth Rinse q12n4p  . multivitamin with minerals  1 tablet Oral Daily  . nicotine  21 mg Transdermal Daily  . pantoprazole (PROTONIX) IV  40 mg Intravenous Q12H  . pneumococcal 23 valent vaccine  0.5 mL Intramuscular Tomorrow-1000  . sodium chloride flush  3 mL Intravenous Q12H  . thiamine  100 mg Oral Daily    Or  . thiamine  100 mg Intravenous Daily   Continuous Infusions: . sodium chloride    . ciprofloxacin Stopped (11/23/16 1049)  . metronidazole Stopped (11/23/16 1405)     LOS: 2 days   Time spent: 35 minutes   Debbora Presto, MD [facility] Pager [phone]  If 7PM-7AM, please contact night-coverage [email or web address] Password [facility] 11/23/2016, 3:34 PM

## 2016-11-23 NOTE — Progress Notes (Signed)
[facility]/Trauma Progress Note      Subjective:  CC: right groin pain  Pt states no groin pain at rest. Pain with deep cough and some movements. Pt has not been up walking since admission. He is tolerating diet, having flatus and no nausea or vomiting. He would like to get his hernia fixed.   Objective: Vital signs in last 24 hours: Temp:  [97.7 F (36.5 C)-98.3 F (36.8 C)] 98.3 F (36.8 C) (06/21 0815) Pulse Rate:  [87-97] 92 (06/21 0815) Resp:  [17-33] 24 (06/21 0815) BP: (99-117)/(70-82) 110/79 (06/21 0815) SpO2:  [92 %-97 %] 95 % (06/21 0815) Last BM Date: 11/21/16  Intake/Output from previous day: 06/20 0701 - 06/21 0700 In: 1140 [P.O.:440; IV Piggyback:700] Out: 300 [Urine:300] Intake/Output this shift: No intake/output data recorded.  PE: Gen:  Alert, NAD, cooperative, pleasant, well appearing, eating breakfast Eyes: Conjunctivae normal. Pupils are equal. Scleral icterusis present Pulm:  Rate and effort normal, clear breath sounds Abd: Soft, mildly distended, +BS, no TTP Skin: warm and dry  Lab Results:   Recent Labs  11/22/16 0119 11/22/16 0553 11/23/16 0441  WBC 10.0  --  8.2  HGB 8.2* 7.0* 7.0*  HCT 25.3* 22.1* 21.2*  PLT 90*  --  106*   BMET  Recent Labs  11/22/16 1144 11/23/16 0441  NA 132* 131*  K 3.2* 4.2  CL 104 104  CO2 22 22  GLUCOSE 127* 67  BUN 9 9  CREATININE 0.88 0.81  CALCIUM 7.8* 7.8*   PT/INR  Recent Labs  11/21/16 2024  LABPROT 21.2*  INR 1.81   CMP     Component Value Date/Time   NA 131 (L) 11/23/2016 0441   K 4.2 11/23/2016 0441   CL 104 11/23/2016 0441   CO2 22 11/23/2016 0441   GLUCOSE 67 11/23/2016 0441   BUN 9 11/23/2016 0441   CREATININE 0.81 11/23/2016 0441   CALCIUM 7.8 (L) 11/23/2016 0441   PROT 6.3 (L) 11/23/2016 0441   ALBUMIN 2.3 (L) 11/23/2016 0441   AST 46 (H) 11/23/2016 0441   ALT 18 11/23/2016 0441   ALKPHOS 87 11/23/2016 0441   BILITOT 4.6 (H) 11/23/2016 0441   GFRNONAA  >60 11/23/2016 0441   GFRAA >60 11/23/2016 0441   Lipase     Component Value Date/Time   LIPASE 30 11/21/2016 1636    Studies/Results: Ct Abdomen Pelvis W Contrast  Result Date: 11/21/2016 CLINICAL DATA:  Acute onset of right inguinal pain after lifting battery. Initial encounter. EXAM: CT ABDOMEN AND PELVIS WITH CONTRAST TECHNIQUE: Multidetector CT imaging of the abdomen and pelvis was performed using the standard protocol following bolus administration of intravenous contrast. CONTRAST:  100mL ISOVUE-300 IOPAMIDOL (ISOVUE-300) INJECTION 61% COMPARISON:  Right upper quadrant ultrasound performed 12/23/2014, and CT of the abdomen and pelvis performed 12/12/2014 FINDINGS: Lower chest: Minimal bibasilar atelectasis is noted. The visualized portions of the mediastinum are unremarkable. Hepatobiliary: There is diffuse nodularity of the liver, with associated heterogeneity, reflecting hepatic cirrhosis and venous congestion. There is recanalization of the umbilical vein. Scattered gastric and splenic varices are noted. The gallbladder is mildly distended, and contains several small stones. The common bile duct remains normal in caliber. Pancreas: The pancreas is within normal limits. Spleen: The spleen is unremarkable in appearance. Adrenals/Urinary Tract: The adrenal glands are unremarkable in appearance. The kidneys are within normal limits. There is no evidence of hydronephrosis. No renal or ureteral stones are identified. Mild left-sided nonspecific perinephric stranding is noted. Stomach/Bowel: The  stomach is unremarkable in appearance. There is herniation of a long segment of distal ileum into a large right inguinal hernia, with mild associated soft tissue edema and a small amount of free fluid. There is no evidence of bowel obstruction. The appendix is normal in caliber, without evidence of appendicitis. Wall thickening along the ascending colon raises concern for an acute infectious or inflammatory  colitis. Vascular/Lymphatic: Scattered calcification is seen along the distal abdominal aorta and its branches. The abdominal aorta is otherwise grossly unremarkable. The inferior vena cava is grossly unremarkable. No retroperitoneal lymphadenopathy is seen. No pelvic sidewall lymphadenopathy is identified. Reproductive: The bladder is mildly distended and grossly unremarkable. The prostate remains normal in size. Other: Small volume ascites is seen within the abdomen and pelvis. Musculoskeletal: No acute osseous abnormalities are identified. The visualized musculature is unremarkable in appearance. IMPRESSION: 1. Wall thickening along the ascending colon raises concern for acute infectious or inflammatory colitis, though it could be chronic in nature. 2. Herniation of a long segment of distal ileum into a large right inguinal hernia, with mild associated soft tissue inflammation and a small amount of free fluid. No evidence of bowel obstruction. 3. Findings of hepatic cirrhosis, with re- cannulization of the umbilical vein, and scattered gastric and splenic varices. 4. Small volume ascites within the abdomen and pelvis. 5. Scattered aortic atherosclerosis. 6. Cholelithiasis, with mild gallbladder distention. Gallbladder otherwise unremarkable. Electronically Signed   By: Roanna Raider M.D.   On: 11/21/2016 18:11   US Abdomen Limited Ruq  Result Date: 11/21/2016 CLINICAL DATA:  66 y/o  M; cholelithiasis. EXAM: ULTRASOUND ABDOMEN LIMITED RIGHT UPPER QUADRANT COMPARISON:  11/21/2016 CT of the abdomen and pelvis. FINDINGS: Gallbladder: Negative sonographic Murphy sign. Gallstones measuring up to 3 mm. Mild gallbladder wall thickening and pericholecystic fluid. Common bile duct: Diameter: 3 mm Liver: Cirrhotic configuration of the liver. Small volume of perihepatic ascites. IMPRESSION: 1. Cholelithiasis. 2. Mild gallbladder wall thickening and pericholecystic fluid is indeterminate for acute cholecystitis in the  setting of ascites. Negative sonographic Murphy's sign. 3. Liver cirrhosis. Electronically Signed   By: Mitzi Hansen M.D.   On: 11/21/2016 22:57    Anti-infectives: Anti-infectives    Start     Dose/Rate Route Frequency Ordered Stop   11/22/16 0630  metroNIDAZOLE (FLAGYL) IVPB 500 mg     500 mg 100 mL/hr over 60 Minutes Intravenous Every 8 hours 11/22/16 0625     11/22/16 0630  ciprofloxacin (CIPRO) IVPB 400 mg     400 mg 200 mL/hr over 60 Minutes Intravenous Every 12 hours 11/22/16 6045         Assessment/Plan Hyperbilirubinemia Melena transaminitis  Hypokalemia mycroctic anemia ETOH abuse with h/o delirium tremens Tobacco abuse Thrombocytopenia Coagulopathy Alcoholic liver cirrhosis Hep C  Right inguinal hernia - distal ileum seen in large right inguinal hernia on CT - pt's co morbidities make him a poor surgical candidate   FEN: full liquids VTE: SCD's ID: Cipro & Flagyl 06/20>>  DISPO: No surgery at this time, since hernia is reducible and no incarceration is noted we recommend conservative measures at this time. If pt continues to have pain would suggest transfer to a tertiary facility.     LOS: 2 days    Jerre Simon , [facility] 11/23/2016, 9:01 AM Pager: [phone] Consults: [phone] Mon-Fri 7:00 am-4:30 pm Sat-Sun 7:00 am-11:30 am

## 2016-11-24 LAB — COMPREHENSIVE METABOLIC PANEL
ALK PHOS: 74 U/L (ref 38–126)
ALT: 19 U/L (ref 17–63)
AST: 50 U/L — AB (ref 15–41)
Albumin: 2.3 g/dL — ABNORMAL LOW (ref 3.5–5.0)
Anion gap: 3 — ABNORMAL LOW (ref 5–15)
BUN: 7 mg/dL (ref 6–20)
CALCIUM: 7.7 mg/dL — AB (ref 8.9–10.3)
CO2: 24 mmol/L (ref 22–32)
CREATININE: 0.77 mg/dL (ref 0.61–1.24)
Chloride: 104 mmol/L (ref 101–111)
Glucose, Bld: 85 mg/dL (ref 65–99)
Potassium: 4.4 mmol/L (ref 3.5–5.1)
Sodium: 131 mmol/L — ABNORMAL LOW (ref 135–145)
Total Bilirubin: 4.6 mg/dL — ABNORMAL HIGH (ref 0.3–1.2)
Total Protein: 6.6 g/dL (ref 6.5–8.1)

## 2016-11-24 LAB — CBC
HEMATOCRIT: 22.2 % — AB (ref 39.0–52.0)
HEMOGLOBIN: 7.3 g/dL — AB (ref 13.0–17.0)
MCH: 26 pg (ref 26.0–34.0)
MCHC: 32.9 g/dL (ref 30.0–36.0)
MCV: 79 fL (ref 78.0–100.0)
Platelets: 117 10*3/uL — ABNORMAL LOW (ref 150–400)
RBC: 2.81 MIL/uL — ABNORMAL LOW (ref 4.22–5.81)
RDW: 24.5 % — ABNORMAL HIGH (ref 11.5–15.5)
WBC: 6.5 10*3/uL (ref 4.0–10.5)

## 2016-11-24 LAB — MAGNESIUM: MAGNESIUM: 1.6 mg/dL — AB (ref 1.7–2.4)

## 2016-11-24 MED ORDER — MAGNESIUM SULFATE 2 GM/50ML IV SOLN
2.0000 g | Freq: Once | INTRAVENOUS | Status: AC
  Administered 2016-11-24: 2 g via INTRAVENOUS
  Filled 2016-11-24: qty 50

## 2016-11-24 NOTE — Progress Notes (Signed)
Pt tx [facility] per Md order, pt VSS, report given to receiving RN, pt verbalized understanding of tx, all questions given

## 2016-11-24 NOTE — Progress Notes (Signed)
Patient ID: Alfred [name], male   DOB: September 09, 1950, 66 y.o.   MRN: [id number]    PROGRESS NOTE  Alfred [name]  WUJ:[id number] DOB: September 09, 1950 DOA: 11/21/2016  PCP: Patient, No Pcp Per   Brief Narrative:  Pt is [age] male with known hx of alcohol abuse, hep C, alcoholic cirrhosis, tobacco abuse, presented with right groin pain. On admission, imaging studies worrisome for herniation of a long segment of distal ileum into a large right inguinal hernia, no evidence of bowel obstruction. Surgery consulted.   Assessment & Plan:   Principal Problem:   Right inguinal hernia - no evidence of obstruction - per surgery team, no indication for urgent surgical intervention at this time but if pt would need this, tertiary medical center recommended  - pt reports feeling better, able to tolerate diet so far with no concerns  - allow analgesia as needed     ? Ascending colitis  - noted on CT abd  - there was also ? Of acute cholecystitis on US but clinically does not correlate  - continue with Cipro and Flagyl day #4/7  Active Problems:   Alcohol abuse, acute metabolic encephalopathy  - no signs of withdrawal, keep on CIWA      Cirrhosis, alcoholic (HCC), Hep C, transaminitis, coagulopathy  - high risk for surgical interventions - ongoing alcohol use  - pt now counseled on cessation     Anemia of chronic disease, thrombocytopenia, now presented with melena  - in the setting of alcoholic cirrhosis, alcohol induced bone marrow damage  - Hg slightly better this AM  - no blood in stool reported, transfuse for Hg < 7 - cbc in AM  - plt counts improving     Hypokalemia and hypomagnesemia - Mg still low, will supplement and repeat BMP and Mg in AM    Hyponatremia - slightly down since admission - stable overall  - BMP in AM  DVT prophylaxis: SCD Code Status: Full  Family Communication: pt at bedside  Disposition Plan: likely home in 1-2 days   Consultants:   Surgery   Procedures:    None  Antimicrobials:   Cipro 6/19 --<  Flagyl 6/19 -->  Subjective: Pt reports feeling better this AM, tolerating diet well.   Objective: Vitals:   11/24/16 0500 11/24/16 0800 11/24/16 1200 11/24/16 1602  BP: 109/79 106/73 (!) 118/92 108/67  Pulse:  81 91   Resp: 13 18 15 16   Temp:  98.1 F (36.7 C)  98.3 F (36.8 C)  TempSrc:  Oral  Oral  SpO2:  100% 100% 98%  Weight:      Height:        Intake/Output Summary (Last 24 hours) at 11/24/16 1757 Last data filed at 11/24/16 1700  Gross per 24 hour  Intake              700 ml  Output              750 ml  Net              -50 ml   Filed Weights   11/21/16 1227 11/22/16 0037  Weight: 74.8 kg (165 lb) 63.2 kg (139 lb 4.8 oz)   Physical Exam  Constitutional: Appears calm, NAD CVS: RRR, S1/S2 +, no murmurs, no gallops, no carotid bruit.  Pulmonary: Effort and breath sounds normal, no stridor, rhonchi, wheezes, rales.  Abdominal: Soft. BS +,  no distension, no tenderness, rebound or guarding.  Musculoskeletal: Normal range  of motion. No edema and no tenderness.  Skin: Skin is warm and dry. No rash noted. Not diaphoretic. No erythema. No pallor.   Data Reviewed: I have personally reviewed following labs and imaging studies  CBC:  Recent Labs Lab 11/21/16 1636 11/22/16 0119 11/22/16 0553 11/23/16 0441 11/24/16 0505  WBC 10.2 10.0  --  8.2 6.5  NEUTROABS 7.2  --   --   --   --   HGB 8.1* 8.2* 7.0* 7.0* 7.3*  HCT 24.8* 25.3* 22.1* 21.2* 22.2*  MCV 79.0 78.3  --  76.8* 79.0  PLT 103* 90*  --  106* 117*   Basic Metabolic Panel:  Recent Labs Lab 11/21/16 1636 11/22/16 0119 11/22/16 1144 11/23/16 0441 11/24/16 0505  NA 133* 133* 132* 131* 131*  K 3.0* 3.0* 3.2* 4.2 4.4  CL 105 105 104 104 104  CO2 22 18* 22 22 24   GLUCOSE 104* 75 127* 67 85  BUN 10 9 9 9 7   CREATININE 0.94 0.90 0.88 0.81 0.77  CALCIUM 8.0* 7.7* 7.8* 7.8* 7.7*  MG  --  1.6*  --   --  1.6*   Liver Function Tests:  Recent Labs Lab  11/21/16 1636 11/22/16 0119 11/23/16 0441 11/24/16 0505  AST 52* 41 46* 50*  ALT 22 18 18 19   ALKPHOS 89 84 87 74  BILITOT 8.9* 8.0*  8.5* 4.6* 4.6*  PROT 7.5 6.8 6.3* 6.6  ALBUMIN 2.6* 2.5* 2.3* 2.3*    Recent Labs Lab 11/21/16 1636  LIPASE 30    Recent Labs Lab 11/21/16 2112  AMMONIA 50*   Coagulation Profile:  Recent Labs Lab 11/21/16 2024  INR 1.81   Anemia Panel:  Recent Labs  11/22/16 0119  VITAMINB12 3,044*  FOLATE 7.4  FERRITIN 25  TIBC 290  IRON 40*  RETICCTPCT 4.2*   Urine analysis:    Component Value Date/Time   COLORURINE AMBER (A) 11/22/2016 1550   APPEARANCEUR CLEAR 11/22/2016 1550   LABSPEC >1.046 (H) 11/22/2016 1550   PHURINE 6.0 11/22/2016 1550   GLUCOSEU NEGATIVE 11/22/2016 1550   HGBUR NEGATIVE 11/22/2016 1550   BILIRUBINUR SMALL (A) 11/22/2016 1550   KETONESUR 5 (A) 11/22/2016 1550   PROTEINUR NEGATIVE 11/22/2016 1550   UROBILINOGEN 0.2 12/21/2014 0829   NITRITE NEGATIVE 11/22/2016 1550   LEUKOCYTESUR NEGATIVE 11/22/2016 1550   Recent Results (from the past 240 hour(s))  MRSA PCR Screening     Status: None   Collection Time: 11/22/16 12:47 AM  Result Value Ref Range Status   MRSA by PCR NEGATIVE NEGATIVE Final    Comment:        The GeneXpert MRSA Assay (FDA approved for NASAL specimens only), is one component of a comprehensive MRSA colonization surveillance program. It is not intended to diagnose MRSA infection nor to guide or monitor treatment for MRSA infections.       Radiology Studies: Ct Abdomen Pelvis W Contrast Result Date: 11/21/2016 Wall thickening along the ascending colon raises concern for acute infectious or inflammatory colitis, though it could be chronic in nature. 2. Herniation of a long segment of distal ileum into a large right inguinal hernia, with mild associated soft tissue inflammation and a small amount of free fluid. No evidence of bowel obstruction. 3. Findings of hepatic cirrhosis, with  re- cannulization of the umbilical vein, and scattered gastric and splenic varices. 4. Small volume ascites within the abdomen and pelvis. 5. Scattered aortic atherosclerosis. 6. Cholelithiasis, with mild gallbladder distention. Gallbladder otherwise unremarkable.  US Abdomen Limited Ruq Result Date: 11/21/2016  Cholelithiasis. 2. Mild gallbladder wall thickening and pericholecystic fluid is indeterminate for acute cholecystitis in the setting of ascites. Negative sonographic Murphy's sign. 3. Liver cirrhosis.  Scheduled Meds: . chlorhexidine  15 mL Mouth Rinse BID  . folic acid  1 mg Oral Daily  . LORazepam  0-4 mg Intravenous Q12H  . mouth rinse  15 mL Mouth Rinse q12n4p  . multivitamin with minerals  1 tablet Oral Daily  . nicotine  21 mg Transdermal Daily  . pantoprazole  40 mg Oral BID  . pneumococcal 23 valent vaccine  0.5 mL Intramuscular Tomorrow-1000  . sodium chloride flush  3 mL Intravenous Q12H  . thiamine  100 mg Oral Daily   Continuous Infusions: . ciprofloxacin Stopped (11/24/16 1118)  . metronidazole Stopped (11/24/16 1553)     LOS: 3 days   Time spent: 25 minutes   Debbora Presto, MD [facility] Pager [phone]  If 7PM-7AM, please contact night-coverage [email or web address] Password [facility] 11/24/2016, 5:57 PM

## 2016-11-24 NOTE — Progress Notes (Signed)
Responded to page to create advanced directive. Explained form and [facility] process to pt and sister in rm, who will discuss on wkend w/ other family and call for [name] chaplain again Monday if pt desireds to complete form then. Provided emotional/spiritual support and prayer. Chaplain available for f/u.   11/24/16 1200  Clinical Encounter Type  Visited With Patient and family together;Health care provider  Visit Type Initial;Psychological support;Spiritual support;Social support  Referral From Nurse  Spiritual Encounters  Spiritual Needs Brochure;Prayer;Emotional  Stress Factors  Patient Stress Factors Health changes;Loss of control  Family Stress Factors Family relationships;Health changes;Loss of control   Alfred [name] Alfred [name], [address]

## 2016-11-25 LAB — CBC
HCT: 21.8 % — ABNORMAL LOW (ref 39.0–52.0)
Hemoglobin: 7.1 g/dL — ABNORMAL LOW (ref 13.0–17.0)
MCH: 24.9 pg — AB (ref 26.0–34.0)
MCHC: 32.6 g/dL (ref 30.0–36.0)
MCV: 76.5 fL — AB (ref 78.0–100.0)
PLATELETS: 116 10*3/uL — AB (ref 150–400)
RBC: 2.85 MIL/uL — ABNORMAL LOW (ref 4.22–5.81)
RDW: 23.9 % — AB (ref 11.5–15.5)
WBC: 6.7 10*3/uL (ref 4.0–10.5)

## 2016-11-25 LAB — BASIC METABOLIC PANEL
Anion gap: 6 (ref 5–15)
BUN: 6 mg/dL (ref 6–20)
CALCIUM: 7.7 mg/dL — AB (ref 8.9–10.3)
CO2: 22 mmol/L (ref 22–32)
Chloride: 104 mmol/L (ref 101–111)
Creatinine, Ser: 0.83 mg/dL (ref 0.61–1.24)
GFR calc Af Amer: >60 mL/min (ref >60)
GLUCOSE: 82 mg/dL (ref 65–99)
Potassium: 4.2 mmol/L (ref 3.5–5.1)
Sodium: 132 mmol/L — ABNORMAL LOW (ref 135–145)

## 2016-11-25 NOTE — Progress Notes (Signed)
Patient ID: Alfred [name], male   DOB: 08/13/1950, 66 y.o.   MRN: [id number]    PROGRESS NOTE  Alfred [name]  WUJ:[id number] DOB: 08/16/1950 DOA: 11/21/2016  PCP: Patient, No Pcp Per   Brief Narrative:  Pt is 73 male with known hx of alcohol abuse, hep C, alcoholic cirrhosis, tobacco abuse, presented with right groin pain. On admission, imaging studies worrisome for herniation of a long segment of distal ileum into a large right inguinal hernia, no evidence of bowel obstruction. Surgery consulted.   Assessment & Plan:   Principal Problem:   Right inguinal hernia - no evidence of obstruction - per surgery team, no indication for urgent surgical intervention at this time but if pt would need this, tertiary medical center recommended  - tolerating diet well so far     ? Ascending colitis  - noted on CT abd  - there was also ? Of acute cholecystitis on [place] but clinically does not correlate  - abd is somewhat distended but pt says this is chronic, appears to be from ascites  - continue with Cipro and Flagyl day #5/7  Active Problems:   Alcohol abuse, acute metabolic encephalopathy  - no signs of withdrawal, keep on CIWA      Cirrhosis, alcoholic (HCC), Hep C, transaminitis, coagulopathy  - high risk for surgical interventions - ongoing alcohol use  - pt counseled on cessation     Anemia of chronic disease, thrombocytopenia, now presented with melena  - in the setting of alcoholic cirrhosis, alcohol induced bone marrow damage  - no blood in stool reported, transfuse for Hg < 7 - Plt improving  - Hg overall stable     Hypokalemia and hypomagnesemia - Mg supplemented, will repeat Mg in AM    Hyponatremia - slightly down since admission - monitor   DVT prophylaxis: SCD Code Status: Full  Family Communication: pt at bedside  Disposition Plan: home in 24-48 hours   Consultants:   Surgery   Procedures:   None  Antimicrobials:   Cipro 6/19 --<  Flagyl 6/19  -->  Subjective: Pt reports feeling better.   Objective: Vitals:   11/24/16 1746 11/24/16 2114 11/25/16 0419 11/25/16 1406  BP: (!) 126/109 112/77 116/74 124/71  Pulse: 91 91 (!) 102 98  Resp: 17 17 17 18   Temp: 98.2 F (36.8 C) 98 F (36.7 C) 97.7 F (36.5 C) 98.2 F (36.8 C)  TempSrc: Oral Oral Oral Oral  SpO2: 98% 99% 100% 100%  Weight:      Height:        Intake/Output Summary (Last 24 hours) at 11/25/16 1446 Last data filed at 11/25/16 1324  Gross per 24 hour  Intake             1180 ml  Output              950 ml  Net              230 ml   Filed Weights   11/21/16 1227 11/22/16 0037  Weight: 74.8 kg (165 lb) 63.2 kg (139 lb 4.8 oz)   Physical Exam  Constitutional: Appears well-developed and well-nourished. No distress.  CVS: RRR, S1/S2 +, no murmurs, no gallops, no carotid bruit.  Pulmonary: Effort and breath sounds normal, no stridor, rhonchi, wheezes, rales.  Abdominal: Soft. BS +,  Distended but not tender  Musculoskeletal: Normal range of motion. No edema and no tenderness.   Data Reviewed: I have personally reviewed  following labs and imaging studies  CBC:  Recent Labs Lab 11/21/16 1636 11/22/16 0119 11/22/16 0553 11/23/16 0441 11/24/16 0505 11/25/16 0521  WBC 10.2 10.0  --  8.2 6.5 6.7  NEUTROABS 7.2  --   --   --   --   --   HGB 8.1* 8.2* 7.0* 7.0* 7.3* 7.1*  HCT 24.8* 25.3* 22.1* 21.2* 22.2* 21.8*  MCV 79.0 78.3  --  76.8* 79.0 76.5*  PLT 103* 90*  --  106* 117* 116*   Basic Metabolic Panel:  Recent Labs Lab 11/22/16 0119 11/22/16 1144 11/23/16 0441 11/24/16 0505 11/25/16 0521  NA 133* 132* 131* 131* 132*  K 3.0* 3.2* 4.2 4.4 4.2  CL 105 104 104 104 104  CO2 18* 22 22 24 22   GLUCOSE 75 127* 67 85 82  BUN 9 9 9 7 6   CREATININE 0.90 0.88 0.81 0.77 0.83  CALCIUM 7.7* 7.8* 7.8* 7.7* 7.7*  MG 1.6*  --   --  1.6*  --    Liver Function Tests:  Recent Labs Lab 11/21/16 1636 11/22/16 0119 11/23/16 0441 11/24/16 0505  AST 52*  41 46* 50*  ALT 22 18 18 19   ALKPHOS 89 84 87 74  BILITOT 8.9* 8.0*  8.5* 4.6* 4.6*  PROT 7.5 6.8 6.3* 6.6  ALBUMIN 2.6* 2.5* 2.3* 2.3*    Recent Labs Lab 11/21/16 1636  LIPASE 30    Recent Labs Lab 11/21/16 2112  AMMONIA 50*   Coagulation Profile:  Recent Labs Lab 11/21/16 2024  INR 1.81   Anemia Panel: No results for input(s): VITAMINB12, FOLATE, FERRITIN, TIBC, IRON, RETICCTPCT in the last 72 hours. Urine analysis:    Component Value Date/Time   COLORURINE AMBER (A) 11/22/2016 1550   APPEARANCEUR CLEAR 11/22/2016 1550   LABSPEC >1.046 (H) 11/22/2016 1550   PHURINE 6.0 11/22/2016 1550   GLUCOSEU NEGATIVE 11/22/2016 1550   HGBUR NEGATIVE 11/22/2016 1550   BILIRUBINUR SMALL (A) 11/22/2016 1550   KETONESUR 5 (A) 11/22/2016 1550   PROTEINUR NEGATIVE 11/22/2016 1550   UROBILINOGEN 0.2 12/21/2014 0829   NITRITE NEGATIVE 11/22/2016 1550   LEUKOCYTESUR NEGATIVE 11/22/2016 1550   Recent Results (from the past 240 hour(s))  MRSA PCR Screening     Status: None   Collection Time: 11/22/16 12:47 AM  Result Value Ref Range Status   MRSA by PCR NEGATIVE NEGATIVE Final    Comment:        The GeneXpert MRSA Assay (FDA approved for NASAL specimens only), is one component of a comprehensive MRSA colonization surveillance program. It is not intended to diagnose MRSA infection nor to guide or monitor treatment for MRSA infections.       Radiology Studies: Ct Abdomen Pelvis W Contrast Result Date: 11/21/2016 Wall thickening along the ascending colon raises concern for acute infectious or inflammatory colitis, though it could be chronic in nature. 2. Herniation of a long segment of distal ileum into a large right inguinal hernia, with mild associated soft tissue inflammation and a small amount of free fluid. No evidence of bowel obstruction. 3. Findings of hepatic cirrhosis, with re- cannulization of the umbilical vein, and scattered gastric and splenic varices. 4. Small  volume ascites within the abdomen and pelvis. 5. Scattered aortic atherosclerosis. 6. Cholelithiasis, with mild gallbladder distention. Gallbladder otherwise unremarkable.   US Abdomen Limited Ruq Result Date: 11/21/2016  Cholelithiasis. 2. Mild gallbladder wall thickening and pericholecystic fluid is indeterminate for acute cholecystitis in the setting of ascites. Negative sonographic Murphy's  sign. 3. Liver cirrhosis.  Scheduled Meds: . chlorhexidine  15 mL Mouth Rinse BID  . folic acid  1 mg Oral Daily  . LORazepam  0-4 mg Intravenous Q12H  . mouth rinse  15 mL Mouth Rinse q12n4p  . multivitamin with minerals  1 tablet Oral Daily  . nicotine  21 mg Transdermal Daily  . pantoprazole  40 mg Oral BID  . sodium chloride flush  3 mL Intravenous Q12H  . thiamine  100 mg Oral Daily   Continuous Infusions: . ciprofloxacin Stopped (11/25/16 1021)  . metronidazole Stopped (11/25/16 0603)     LOS: 4 days   Time spent: 25 minutes   Debbora PrestoIskra Magick-Myers, MD [facility] Pager [phone]  If 7PM-7AM, please contact night-coverage [email or web address] Password [email or web address] 11/25/2016, 2:46 PM

## 2016-11-25 NOTE — Evaluation (Signed)
Physical Therapy Evaluation Patient Details Name: Alfred [name] MRN: [id number] DOB: 09/22/1950 Today's Date: 11/25/2016   History of Present Illness  Pt is 66 male with known hx of alcohol abuse, hep C, alcoholic cirrhosis, tobacco abuse, presented with right groin pain. On admission, imaging studies worrisome for herniation of a long segment of distal ileum into a large right inguinal hernia  Clinical Impression  Pt admitted with above diagnosis. Pt currently with functional limitations due to the deficits listed below (see PT Problem List). Pt ambulated 10' with min A before becoming very fatigued with shaking UE's and LE's and needed to return to sitting. HR 119 bpm with ambulation, increased RR, O2 sats 97%.  Pt will benefit from skilled PT to increase their independence and safety with mobility to allow discharge to the venue listed below.       Follow Up Recommendations Home health PT    Equipment Recommendations  None recommended by PT    Recommendations for Other Services       Precautions / Restrictions Precautions Precautions: Fall Restrictions Weight Bearing Restrictions: No      Mobility  Bed Mobility Overal bed mobility: Needs Assistance Bed Mobility: Supine to Sit     Supine to sit: Supervision     General bed mobility comments: pt pivots then sits straight up with significant RLQ pain, discussed rolling to left side first before sitting up to decrease pain  Transfers Overall transfer level: Needs assistance Equipment used: Rolling walker (2 wheeled) Transfers: Sit to/from Stand Sit to Stand: Min guard         General transfer comment: vc's for hand placement, increased time needed  Ambulation/Gait Ambulation/Gait assistance: Min assist Ambulation Distance (Feet): 20 Feet Assistive device: Rolling walker (2 wheeled) Gait Pattern/deviations: Step-through pattern;Decreased stride length Gait velocity: decreased Gait velocity interpretation: <1.8  ft/sec, indicative of risk for recurrent falls General Gait Details: pt ambulates with increased B knee flexion and after 10', LE's and UE's began to shake and pt needed a standing rest. Was able to return to chair but was very fatigued after ambulation with increased RR and HR 119 bpm  Stairs            Wheelchair Mobility    Modified Rankin (Stroke Patients Only)       Balance Overall balance assessment: Needs assistance Sitting-balance support: No upper extremity supported Sitting balance-Leahy Scale: Fair     Standing balance support: Bilateral upper extremity supported Standing balance-Leahy Scale: Poor                               Pertinent Vitals/Pain Pain Assessment: 0-10 Pain Score: 8  Pain Location: RLQ Pain Descriptors / Indicators: Moaning;Aching Pain Intervention(s): Limited activity within patient's tolerance;Monitored during session;Premedicated before session    Home Living Family/patient expects to be discharged to:: Private residence Living Arrangements: Non-relatives/Friends Available Help at Discharge: Friend(s);Available 24 hours/day Type of Home: House Home Access: Stairs to enter Entrance Stairs-Rails: Doctor, general practice of Steps: 6 Home Layout: One level Home Equipment: Cane - single point;Walker - 2 wheels Additional Comments: lives with friend    Prior Function Level of Independence: Independent with assistive device(s)         Comments: used cane and RW for getting up and down     Hand Dominance        Extremity/Trunk Assessment   Upper Extremity Assessment Upper Extremity Assessment: Generalized weakness  Lower Extremity Assessment Lower Extremity Assessment: Generalized weakness    Cervical / Trunk Assessment Cervical / Trunk Assessment: Normal  Communication   Communication: No difficulties  Cognition Arousal/Alertness: Awake/alert Behavior During Therapy: WFL for tasks  assessed/performed Overall Cognitive Status: Within Functional Limits for tasks assessed                                        General Comments      Exercises     Assessment/Plan    PT Assessment Patient needs continued PT services  PT Problem List Decreased strength;Decreased range of motion;Decreased activity tolerance;Decreased balance;Decreased mobility;Decreased knowledge of use of DME;Decreased knowledge of precautions;Pain       PT Treatment Interventions DME instruction;Gait training;Stair training;Functional mobility training;Therapeutic activities;Therapeutic exercise;Balance training;Patient/family education    PT Goals (Current goals can be found in the Care Plan section)  Acute Rehab PT Goals Patient Stated Goal: have surgery so the hernia will be fixed PT Goal Formulation: With patient Time For Goal Achievement: 12/09/16 Potential to Achieve Goals: Good    Frequency Min 3X/week   Barriers to discharge Inaccessible home environment 6-8 steps to enter home    Co-evaluation               AM-PAC PT "6 Clicks" Daily Activity  Outcome Measure Difficulty turning over in bed (including adjusting bedclothes, sheets and blankets)?: A Little Difficulty moving from lying on back to sitting on the side of the bed? : A Little Difficulty sitting down on and standing up from a chair with arms (e.g., wheelchair, bedside commode, etc,.)?: Total Help needed moving to and from a bed to chair (including a wheelchair)?: A Little Help needed walking in hospital room?: A Little Help needed climbing 3-5 steps with a railing? : A Lot 6 Click Score: 15    End of Session Equipment Utilized During Treatment: Gait belt Activity Tolerance: Patient tolerated treatment well Patient left: in chair;with call bell/phone within reach;with chair alarm set Nurse Communication: Mobility status PT Visit Diagnosis: Unsteadiness on feet (R26.81);Muscle weakness  (generalized) (M62.81);Pain Pain - Right/Left: Right Pain - part of body:  (abdomen)    Time: 1028-1100 PT Time Calculation (min) (ACUTE ONLY): 32 min   Charges:   PT Evaluation $PT Eval Moderate Complexity: 1 Procedure PT Treatments $Gait Training: 8-22 mins   PT G Codes:        Lyanne CoVictoria Danielys Madry, PT  Acute Rehab Services  [phone]   CelinaVictoria L Toria Monte 11/25/2016, 11:05 AM

## 2016-11-26 LAB — BASIC METABOLIC PANEL
ANION GAP: 3 — AB (ref 5–15)
BUN: 7 mg/dL (ref 6–20)
CHLORIDE: 103 mmol/L (ref 101–111)
CO2: 22 mmol/L (ref 22–32)
Calcium: 7.8 mg/dL — ABNORMAL LOW (ref 8.9–10.3)
Creatinine, Ser: 0.86 mg/dL (ref 0.61–1.24)
GFR calc Af Amer: >60 mL/min (ref >60)
GFR calc non Af Amer: >60 mL/min (ref >60)
GLUCOSE: 98 mg/dL (ref 65–99)
POTASSIUM: 4 mmol/L (ref 3.5–5.1)
Sodium: 128 mmol/L — ABNORMAL LOW (ref 135–145)

## 2016-11-26 LAB — CBC
HEMATOCRIT: 20.5 % — AB (ref 39.0–52.0)
HEMOGLOBIN: 6.8 g/dL — AB (ref 13.0–17.0)
MCH: 25.1 pg — ABNORMAL LOW (ref 26.0–34.0)
MCHC: 33.2 g/dL (ref 30.0–36.0)
MCV: 75.6 fL — AB (ref 78.0–100.0)
Platelets: 121 10*3/uL — ABNORMAL LOW (ref 150–400)
RBC: 2.71 MIL/uL — AB (ref 4.22–5.81)
RDW: 23.8 % — AB (ref 11.5–15.5)
WBC: 7.8 10*3/uL (ref 4.0–10.5)

## 2016-11-26 LAB — PREPARE RBC (CROSSMATCH)

## 2016-11-26 LAB — MAGNESIUM: Magnesium: 1.8 mg/dL (ref 1.7–2.4)

## 2016-11-26 MED ORDER — SODIUM CHLORIDE 0.9 % IV SOLN: Freq: Once | INTRAVENOUS | Status: DC

## 2016-11-26 MED ORDER — FUROSEMIDE 10 MG/ML IJ SOLN
20.0000 mg | Freq: Once | INTRAMUSCULAR | Status: AC
  Administered 2016-11-26: 20 mg via INTRAVENOUS
  Filled 2016-11-26: qty 2

## 2016-11-26 NOTE — Progress Notes (Addendum)
Patient ID: Alfred [name], male   DOB: 04-30-1951, 66 y.o.   MRN: [id number]    PROGRESS NOTE  Alfred [name]  QMV:[id number] DOB: 04-30-1951 DOA: 11/21/2016  PCP: Patient, No Pcp Per   Brief Narrative:  Pt is [age] male with known hx of alcohol abuse, hep C, alcoholic cirrhosis, tobacco abuse, presented with right groin pain. On admission, imaging studies worrisome for herniation of a long segment of distal ileum into a large right inguinal hernia, no evidence of bowel obstruction. Surgery consulted.   Assessment & Plan:   Principal Problem:   Right inguinal hernia - no evidence of obstruction - per surgery team, no indication for urgent surgical intervention at this time but if pt would need this, tertiary medical center recommended  - tolerating diet so far    Abd ascites - IR consulted for paracentesis, plan to perform in AM    Urinary retention - urology assistance appreciated - placed on Tamsulosin, outpatient follow up recommended for consideration of cystoscopy     ? Ascending colitis  - noted on CT abd  - there was also ? Of acute cholecystitis on US but clinically does not correlate  - abd is somewhat distended but pt says this is chronic, appears to be from ascites  - continue with Cipro and Flagyl day #6/7  Active Problems:   Alcohol abuse, acute metabolic encephalopathy  - no signs of withdrawal, keep on CIWA      Cirrhosis, alcoholic (HCC), Hep C, transaminitis, coagulopathy  - high risk for surgical interventions - ongoing alcohol use  - pt counseled on cessation - asked IR to do paracentesis, will be scheduled for AM    Anemia of chronic disease, thrombocytopenia, now presented with melena  - in the setting of alcoholic cirrhosis, alcohol induced bone marrow damage  - Hg drop overnight, pt says he has not seen any blood in stool since admission - transfuse 1 U PRBC - CBC in AM    Hypokalemia and hypomagnesemia - Mg 1.8, K is WNL - repeat BMP and Mg in  AM    Hyponatremia - trending down, suspect from volume status, ascites - will repeat in AM - will give dose of lasix to see if that will help   DVT prophylaxis: SCD Code Status: Full  Family Communication: pt at bedside  Disposition Plan: home in 24-48 hours   Consultants:   Surgery   IR  Urology   Procedures:   None  Antimicrobials:   Cipro 6/19 --<  Flagyl 6/19 -->  Subjective: Pt denies chest pain and no dyspnea.   Objective: Vitals:   11/26/16 0522 11/26/16 1000 11/26/16 1030 11/26/16 1319  BP: 120/79 104/65 105/68 123/75  Pulse: 95 89 85 87  Resp: 19 16 16 16   Temp: 97.7 F (36.5 C) 98.3 F (36.8 C) 98.4 F (36.9 C) 97.7 F (36.5 C)  TempSrc: Oral Oral Oral Oral  SpO2: 96% 99% 100% 99%  Weight:      Height:        Intake/Output Summary (Last 24 hours) at 11/26/16 1505 Last data filed at 11/26/16 1341  Gross per 24 hour  Intake             1190 ml  Output              225 ml  Net              965 ml   Filed Weights   11/21/16 1227  11/22/16 0037  Weight: 74.8 kg (165 lb) 63.2 kg (139 lb 4.8 oz)   Physical Exam  Constitutional: Appears calm, NAD CVS: RRR, S1/S2 +, no gallops, no carotid bruit.  Pulmonary: Effort and breath sounds normal, no stridor, rhonchi, wheezes, rales.  Abdominal: distended, dullness to percussion  Data Reviewed: I have personally reviewed following labs and imaging studies  CBC:  Recent Labs Lab 11/21/16 1636 11/22/16 0119 11/22/16 0553 11/23/16 0441 11/24/16 0505 11/25/16 0521 11/26/16 0502  WBC 10.2 10.0  --  8.2 6.5 6.7 7.8  NEUTROABS 7.2  --   --   --   --   --   --   HGB 8.1* 8.2* 7.0* 7.0* 7.3* 7.1* 6.8*  HCT 24.8* 25.3* 22.1* 21.2* 22.2* 21.8* 20.5*  MCV 79.0 78.3  --  76.8* 79.0 76.5* 75.6*  PLT 103* 90*  --  106* 117* 116* 121*   Basic Metabolic Panel:  Recent Labs Lab 11/22/16 0119 11/22/16 1144 11/23/16 0441 11/24/16 0505 11/25/16 0521 11/26/16 0502  NA 133* 132* 131* 131* 132* 128*   K 3.0* 3.2* 4.2 4.4 4.2 4.0  CL 105 104 104 104 104 103  CO2 18* 22 22 24 22 22   GLUCOSE 75 127* 67 85 82 98  BUN 9 9 9 7 6 7   CREATININE 0.90 0.88 0.81 0.77 0.83 0.86  CALCIUM 7.7* 7.8* 7.8* 7.7* 7.7* 7.8*  MG 1.6*  --   --  1.6*  --  1.8   Liver Function Tests:  Recent Labs Lab 11/21/16 1636 11/22/16 0119 11/23/16 0441 11/24/16 0505  AST 52* 41 46* 50*  ALT 22 18 18 19   ALKPHOS 89 84 87 74  BILITOT 8.9* 8.0*  8.5* 4.6* 4.6*  PROT 7.5 6.8 6.3* 6.6  ALBUMIN 2.6* 2.5* 2.3* 2.3*    Recent Labs Lab 11/21/16 1636  LIPASE 30    Recent Labs Lab 11/21/16 2112  AMMONIA 50*   Coagulation Profile:  Recent Labs Lab 11/21/16 2024  INR 1.81   Urine analysis:    Component Value Date/Time   COLORURINE AMBER (A) 11/22/2016 1550   APPEARANCEUR CLEAR 11/22/2016 1550   LABSPEC >1.046 (H) 11/22/2016 1550   PHURINE 6.0 11/22/2016 1550   GLUCOSEU NEGATIVE 11/22/2016 1550   HGBUR NEGATIVE 11/22/2016 1550   BILIRUBINUR SMALL (A) 11/22/2016 1550   KETONESUR 5 (A) 11/22/2016 1550   PROTEINUR NEGATIVE 11/22/2016 1550   UROBILINOGEN 0.2 12/21/2014 0829   NITRITE NEGATIVE 11/22/2016 1550   LEUKOCYTESUR NEGATIVE 11/22/2016 1550   Recent Results (from the past 240 hour(s))  MRSA PCR Screening     Status: None   Collection Time: 11/22/16 12:47 AM  Result Value Ref Range Status   MRSA by PCR NEGATIVE NEGATIVE Final    Comment:        The GeneXpert MRSA Assay (FDA approved for NASAL specimens only), is one component of a comprehensive MRSA colonization surveillance program. It is not intended to diagnose MRSA infection nor to guide or monitor treatment for MRSA infections.       Radiology Studies: Ct Abdomen Pelvis W Contrast Result Date: 11/21/2016 Wall thickening along the ascending colon raises concern for acute infectious or inflammatory colitis, though it could be chronic in nature. 2. Herniation of a long segment of distal ileum into a large right inguinal  hernia, with mild associated soft tissue inflammation and a small amount of free fluid. No evidence of bowel obstruction. 3. Findings of hepatic cirrhosis, with re- cannulization of the umbilical  vein, and scattered gastric and splenic varices. 4. Small volume ascites within the abdomen and pelvis. 5. Scattered aortic atherosclerosis. 6. Cholelithiasis, with mild gallbladder distention. Gallbladder otherwise unremarkable.   US Abdomen Limited Ruq Result Date: 11/21/2016  Cholelithiasis. 2. Mild gallbladder wall thickening and pericholecystic fluid is indeterminate for acute cholecystitis in the setting of ascites. Negative sonographic Murphy's sign. 3. Liver cirrhosis.  Scheduled Meds: . chlorhexidine  15 mL Mouth Rinse BID  . folic acid  1 mg Oral Daily  . mouth rinse  15 mL Mouth Rinse q12n4p  . multivitamin with minerals  1 tablet Oral Daily  . nicotine  21 mg Transdermal Daily  . pantoprazole  40 mg Oral BID  . sodium chloride flush  3 mL Intravenous Q12H  . thiamine  100 mg Oral Daily   Continuous Infusions: . sodium chloride    . ciprofloxacin Stopped (11/26/16 1127)  . metronidazole 500 mg (11/26/16 1348)     LOS: 5 days   Time spent: 35 minutes   Debbora Presto, MD [facility] Pager [phone]  If 7PM-7AM, please contact night-coverage [email or web address] Password [facility] 11/26/2016, 3:05 PM

## 2016-11-26 NOTE — Progress Notes (Deleted)
Discharge instructions gone over with patient. Home medications discussed. Prescriptions given. Follow up appointment is made. Diet, activity, incisional care, and reasons to call the doctor discussed. Signs and symptoms of infection and or tetany discussed. Patient verbalized understanding of instructions. 

## 2016-11-26 NOTE — Consult Note (Addendum)
Consult: BPH with weak stream, possible urinary retention Requested by: Dr. Sanda Kleinavid [name]  History of Present Illness: 66 yo AAM with known hx of alcohol abuse, hep C, alcoholic cirrhosis, tobacco abuse, presented with right groin pain and found to have a large right inguinal hernia that is reducible. Given high risk, Gen Surg recommended txfr to tertiary referral center to consider hernia repair.   Pt c/o long h/o weak intermittent stream with urgency and nighttime incontinence. Going on for years. No GU hx. No dysuria or gross hematuria. Per nurse, pt voided 225 mL last night of and because of scrotal and abd distention a bladder scan was done. This read about 800+ mL. Therefore, an I&O cath was done. Per pt, the catheter was placed this AM and "went all the way in but nothing came out". Per nurse, only 10 mL was received with the I&O cath. Of note, he has ascites on his CT scan (nicely seen above his bladder on the sagittals) associated with advanced findings of cirrhosis. His bladder was not distended and his prostate appeared normal. I reviewed all the images. Currently, pt doing well and does not have an urge to void.   Past Medical History:  Diagnosis Date  . Delirium tremens (HCC) 09/10/2013  . Dental abscess 09/10/2013  . Knee effusion, left 09/10/2013  . Mental status change 09/10/2013   Past Surgical History:  Procedure Laterality Date  . LAPAROSCOPIC ABDOMINAL EXPLORATION N/A 12/12/2014   Procedure: LAPAROSCOPIC ABDOMINAL EXPLORATION Omental patch for perforation and core liver biopsies;  Surgeon: Karie SodaSteven Gross, MD;  Location: [facility];  Service: General;  Laterality: N/A;    Home Medications:  Prescriptions Prior to Admission  Medication Sig Dispense Refill Last Dose  . cloNIDine (CATAPRES - DOSED IN MG/24 HR) 0.2 mg/24hr patch Place 1 patch (0.2 mg total) onto the skin once a week. (Patient not taking: Reported on 11/21/2016) 4 patch 0 Not Taking at Unknown time  . methocarbamol (ROBAXIN) 500  MG tablet Take 2 tablets (1,000 mg total) by mouth every 6 (six) hours as needed for muscle spasms. (Patient not taking: Reported on 11/21/2016) 30 tablet 0 Not Taking at Unknown time  . metoprolol tartrate (LOPRESSOR) 25 MG tablet Take 1 tablet (25 mg total) by mouth 2 (two) times daily. (Patient not taking: Reported on 11/21/2016) 60 tablet 0 Not Taking at Unknown time  . pantoprazole (PROTONIX) 40 MG tablet Take 1 tablet (40 mg total) by mouth 2 (two) times daily. (Patient not taking: Reported on 11/21/2016) 60 tablet 0 Not Taking at Unknown time  . thiamine 100 MG tablet Take 1 tablet (100 mg total) by mouth daily. (Patient not taking: Reported on 11/21/2016) 30 tablet 1 Not Taking at Unknown time   Allergies:  Allergies  Allergen Reactions  . Nsaids Other (See Comments)    Perforated ulcer    No family history on file. Social History:  reports that he has been smoking Cigarettes.  He has been smoking about 1.00 pack per day. He has never used smokeless tobacco. He reports that he drinks alcohol. He reports that he does not use drugs.  ROS: A complete review of systems was performed.  All systems are negative except for pertinent findings as noted. Review of Systems  All other systems reviewed and are negative.    Physical Exam:  Vital signs in last 24 hours: Temp:  [97.7 F (36.5 C)-98.4 F (36.9 C)] 97.7 F (36.5 C) (06/24 0522) Pulse Rate:  [91-98] 95 (06/24 0522)  Resp:  [17-19] 19 (06/24 0522) BP: (106-124)/(63-79) 120/79 (06/24 0522) SpO2:  [96 %-100 %] 96 % (06/24 0522) General:  Alert and oriented, He's up and walking around and looks to be in no pain or distress.  HEENT: Normocephalic, atraumatic Neck: No JVD or lymphadenopathy Cardiovascular: Regular rate and rhythm Lungs: Regular rate and effort Abdomen: Soft, nontender, distended, no abdominal masses, tympanic. No obvious bladder distention.  Back: No CVA tenderness Extremities: No edema Neurologic: Grossly  intact GU: circumcised penis, no mass or lesion, Left testicle palpably normal, right testicle not easily palpable because of the right inguinal hernia. Hernia non-tender. On DRE: prostate ~30 grams, smooth, no hard area or nodule. He voided after the exam and I scanned his SP area and got ~850 mL, too.  Laboratory Data:  Results for orders placed or performed during the hospital encounter of 11/21/16 (from the past 24 hour(s))  CBC     Status: Abnormal   Collection Time: 11/26/16  5:02 AM  Result Value Ref Range   WBC 7.8 4.0 - 10.5 K/uL   RBC 2.71 (L) 4.22 - 5.81 MIL/uL   Hemoglobin 6.8 (LL) 13.0 - 17.0 g/dL   HCT 09.8 (L) 11.9 - 14.7 %   MCV 75.6 (L) 78.0 - 100.0 fL   MCH 25.1 (L) 26.0 - 34.0 pg   MCHC 33.2 30.0 - 36.0 g/dL   RDW 82.9 (H) 56.2 - 13.0 %   Platelets 121 (L) 150 - 400 K/uL  Basic metabolic panel     Status: Abnormal   Collection Time: 11/26/16  5:02 AM  Result Value Ref Range   Sodium 128 (L) 135 - 145 mmol/L   Potassium 4.0 3.5 - 5.1 mmol/L   Chloride 103 101 - 111 mmol/L   CO2 22 22 - 32 mmol/L   Glucose, Bld 98 65 - 99 mg/dL   BUN 7 6 - 20 mg/dL   Creatinine, Ser 8.65 0.61 - 1.24 mg/dL   Calcium 7.8 (L) 8.9 - 10.3 mg/dL   GFR calc non Af Amer >60 >60 mL/min   GFR calc Af Amer >60 >60 mL/min   Anion gap 3 (L) 5 - 15  Magnesium     Status: None   Collection Time: 11/26/16  5:02 AM  Result Value Ref Range   Magnesium 1.8 1.7 - 2.4 mg/dL  Type and screen  [facility]     Status: None   Collection Time: 11/26/16  7:00 AM  Result Value Ref Range   ABO/RH(D) A POS    Antibody Screen NEG    Sample Expiration 11/29/2016    Recent Results (from the past 240 hour(s))  MRSA PCR Screening     Status: None   Collection Time: 11/22/16 12:47 AM  Result Value Ref Range Status   MRSA by PCR NEGATIVE NEGATIVE Final    Comment:        The GeneXpert MRSA Assay (FDA approved for NASAL specimens only), is one component of a comprehensive MRSA  colonization surveillance program. It is not intended to diagnose MRSA infection nor to guide or monitor treatment for MRSA infections.    Creatinine:  Recent Labs  11/21/16 1636 11/22/16 0119 11/22/16 1144 11/23/16 0441 11/24/16 0505 11/25/16 0521 11/26/16 0502  CREATININE 0.94 0.90 0.88 0.81 0.77 0.83 0.86    Impression/Assessment/plan:  Mild BPH with weak stream and urgency in setting of cirrhosis and ascites  - based on his hx, exam and findings when nurses did I&O cath, pt  not in retention but may have LUTS from BPH or over active bladder. The bladder scanners are not reliable in pts with ascites. I discussed with patient a repeat I&O cath by myself and he declined. Again, he has no urge to void. We also discussed the nature r/b/a of trial of tamsulosin and he elected to proceed. Place on tamsulosin to see if LUTS improve. He can f/u with me outpt and if he continues to have bothersome LUTS, we could consider cystoscopy.   Alfred [name] 11/26/2016, 8:40 AM

## 2016-11-26 NOTE — Progress Notes (Signed)
CRITICAL VALUE ALERT  Critical Value: Hgb=6.8  Date & Time Notied:11-26-16  Provider Notified:Dr. Robb Matarrtiz   Orders Received/Actions taken:Type and cross for 1 unit of PRBC

## 2016-11-27 ENCOUNTER — Encounter (HOSPITAL_COMMUNITY): Payer: Self-pay | Admitting: Student

## 2016-11-27 ENCOUNTER — Inpatient Hospital Stay (HOSPITAL_COMMUNITY): Payer: Medicaid Other

## 2016-11-27 HISTORY — PX: IR PARACENTESIS: IMG2679

## 2016-11-27 LAB — GRAM STAIN: Gram Stain: NONE SEEN

## 2016-11-27 LAB — COMPREHENSIVE METABOLIC PANEL
ALT: 16 U/L — ABNORMAL LOW (ref 17–63)
ANION GAP: 7 (ref 5–15)
AST: 44 U/L — ABNORMAL HIGH (ref 15–41)
Albumin: 2.1 g/dL — ABNORMAL LOW (ref 3.5–5.0)
Alkaline Phosphatase: 70 U/L (ref 38–126)
BUN: 8 mg/dL (ref 6–20)
CHLORIDE: 102 mmol/L (ref 101–111)
CO2: 20 mmol/L — AB (ref 22–32)
CREATININE: 0.94 mg/dL (ref 0.61–1.24)
Calcium: 7.7 mg/dL — ABNORMAL LOW (ref 8.9–10.3)
GFR calc non Af Amer: >60 mL/min (ref >60)
Glucose, Bld: 85 mg/dL (ref 65–99)
Potassium: 4 mmol/L (ref 3.5–5.1)
SODIUM: 129 mmol/L — AB (ref 135–145)
TOTAL PROTEIN: 6 g/dL — AB (ref 6.5–8.1)
Total Bilirubin: 5.7 mg/dL — ABNORMAL HIGH (ref 0.3–1.2)

## 2016-11-27 LAB — LACTATE DEHYDROGENASE, PLEURAL OR PERITONEAL FLUID: LD, Fluid: 22 U/L (ref 3–23)

## 2016-11-27 LAB — TYPE AND SCREEN
ABO/RH(D): A POS
Antibody Screen: NEGATIVE
Unit division: 0

## 2016-11-27 LAB — BODY FLUID CELL COUNT WITH DIFFERENTIAL
Eos, Fluid: 0 %
Lymphs, Fluid: 8 %
MONOCYTE-MACROPHAGE-SEROUS FLUID: 89 % (ref 50–90)
Neutrophil Count, Fluid: 3 % (ref 0–25)
Other Cells, Fluid: 0 %
WBC FLUID: 90 uL (ref 0–1000)

## 2016-11-27 LAB — CBC
HCT: 23.6 % — ABNORMAL LOW (ref 39.0–52.0)
Hemoglobin: 8.1 g/dL — ABNORMAL LOW (ref 13.0–17.0)
MCH: 25.9 pg — ABNORMAL LOW (ref 26.0–34.0)
MCHC: 34.3 g/dL (ref 30.0–36.0)
MCV: 75.4 fL — ABNORMAL LOW (ref 78.0–100.0)
Platelets: 112 10*3/uL — ABNORMAL LOW (ref 150–400)
RBC: 3.13 MIL/uL — ABNORMAL LOW (ref 4.22–5.81)
RDW: 21.7 % — AB (ref 11.5–15.5)
WBC: 9.2 10*3/uL (ref 4.0–10.5)

## 2016-11-27 LAB — GLUCOSE, PLEURAL OR PERITONEAL FLUID: Glucose, Fluid: 102 mg/dL

## 2016-11-27 LAB — ALBUMIN, PLEURAL OR PERITONEAL FLUID

## 2016-11-27 LAB — MAGNESIUM: MAGNESIUM: 1.5 mg/dL — AB (ref 1.7–2.4)

## 2016-11-27 LAB — AMYLASE, PLEURAL OR PERITONEAL FLUID: Amylase, Fluid: 51 U/L

## 2016-11-27 LAB — BPAM RBC
Blood Product Expiration Date: 201806292359
ISSUE DATE / TIME: 201806240955
UNIT TYPE AND RH: 6200

## 2016-11-27 LAB — PROTEIN, PLEURAL OR PERITONEAL FLUID

## 2016-11-27 LAB — BILIRUBIN, TOTAL: Total Bilirubin: 6.1 mg/dL — ABNORMAL HIGH (ref 0.3–1.2)

## 2016-11-27 MED ORDER — LIDOCAINE HCL (PF) 1 % IJ SOLN
INTRAMUSCULAR | Status: AC
  Filled 2016-11-27: qty 30

## 2016-11-27 MED ORDER — MAGNESIUM SULFATE 2 GM/50ML IV SOLN
2.0000 g | Freq: Once | INTRAVENOUS | Status: AC
  Administered 2016-11-27: 2 g via INTRAVENOUS
  Filled 2016-11-27: qty 50

## 2016-11-27 MED ORDER — LIDOCAINE HCL 1 % IJ SOLN
INTRAMUSCULAR | Status: DC | PRN
  Administered 2016-11-27: 10 mL

## 2016-11-27 MED ORDER — FUROSEMIDE 10 MG/ML IJ SOLN
20.0000 mg | Freq: Every day | INTRAMUSCULAR | Status: DC
  Administered 2016-11-27 – 2016-11-28 (×2): 20 mg via INTRAVENOUS
  Filled 2016-11-27 (×2): qty 2

## 2016-11-27 NOTE — Procedures (Signed)
PROCEDURE SUMMARY:  Successful US guided paracentesis from left lateral abdomen.  Yielded 1.8 liters of yellow fluid.  No immediate complications.  Pt tolerated well.   Specimen was sent for labs.  Hoyt KochKacie Sue-Ellen Matthews PA-C 11/27/2016 12:26 PM

## 2016-11-27 NOTE — Progress Notes (Signed)
Physical Therapy Treatment Patient Details Name: Alfred [name] MRN: [id number] DOB: 02-Jul-1950 Today's Date: 11/27/2016    History of Present Illness Pt is [age] male with known hx of alcohol abuse, hep C, alcoholic cirrhosis, tobacco abuse, presented with right groin pain. On admission, imaging studies worrisome for herniation of a long segment of distal ileum into a large right inguinal hernia    PT Comments    Pt with excellent progression from prior visit and able to ambulate in hall and perform stairs. Pt states continued need for RW and limited by dizziness with gait with SpO2 89% on RA. Pt educated for HEP, gait and progression and will continue to follow for safe return home.    Follow Up Recommendations  Home health PT     Equipment Recommendations  Rolling walker with 5" wheels    Recommendations for Other Services       Precautions / Restrictions Precautions Precautions: Fall Precaution Comments: watch sats with gait    Mobility  Bed Mobility Overal bed mobility: Modified Independent                Transfers Overall transfer level: Needs assistance   Transfers: Sit to/from Stand           General transfer comment: cues for hand placement  Ambulation/Gait Ambulation/Gait assistance: Supervision Ambulation Distance (Feet): 150 Feet Assistive device: Rolling walker (2 wheeled) Gait Pattern/deviations: Step-through pattern;Decreased stride length   Gait velocity interpretation: Below normal speed for age/gender General Gait Details: cues for position in RW, safety and cues for pursed lip breathing. After 100' pt noted dizziness which resolved at rest with standing. Sp02 89% on arrival at rest. With breathing cues sats rose and dizziness subsided   Stairs Stairs: Yes   Stair Management: One rail Right;Alternating pattern;Forwards Number of Stairs: 4    Wheelchair Mobility    Modified Rankin (Stroke Patients Only)       Balance Overall  balance assessment: Needs assistance   Sitting balance-Leahy Scale: Good       Standing balance-Leahy Scale: Fair                              Cognition Arousal/Alertness: Awake/alert Behavior During Therapy: WFL for tasks assessed/performed Overall Cognitive Status: Within Functional Limits for tasks assessed                                        Exercises General Exercises - Lower Extremity Hip Flexion/Marching: AROM;Both;Seated;10 reps    General Comments        Pertinent Vitals/Pain Pain Assessment: No/denies pain    Home Living                      Prior Function            PT Goals (current goals can now be found in the care plan section) Progress towards PT goals: Progressing toward goals    Frequency           PT Plan Current plan remains appropriate    Co-evaluation              AM-PAC PT "6 Clicks" Daily Activity  Outcome Measure  Difficulty turning over in bed (including adjusting bedclothes, sheets and blankets)?: None Difficulty moving from lying on back to sitting on the side of  the bed? : None Difficulty sitting down on and standing up from a chair with arms (e.g., wheelchair, bedside commode, etc,.)?: A Little Help needed moving to and from a bed to chair (including a wheelchair)?: None Help needed walking in hospital room?: A Little Help needed climbing 3-5 steps with a railing? : None 6 Click Score: 22    End of Session Equipment Utilized During Treatment: Gait belt Activity Tolerance: Patient tolerated treatment well Patient left: in chair;with call bell/phone within reach;with chair alarm set Nurse Communication: Mobility status PT Visit Diagnosis: Muscle weakness (generalized) (M62.81);Pain;Difficulty in walking, not elsewhere classified (R26.2)     Time: 1610-9604 PT Time Calculation (min) (ACUTE ONLY): 20 min  Charges:  $Gait Training: 8-22 mins                    G Codes:        Alfred [name], PT [phone]    Luvina Poirier B Clydette Privitera 11/27/2016, 1:05 PM

## 2016-11-27 NOTE — Progress Notes (Signed)
Patient ID: Alfred [name], male   DOB: 1951/02/15, 66 y.o.   MRN: [id number]    PROGRESS NOTE  Alfred [name]  HQI:[id number] DOB: 1951/02/15 DOA: 11/21/2016  PCP: Patient, No Pcp Per   Brief Narrative:  Pt is [age] male with known hx of alcohol abuse, hep C, alcoholic cirrhosis, tobacco abuse, presented with right groin pain. On admission, imaging studies worrisome for herniation of a long segment of distal ileum into a large right inguinal hernia, no evidence of bowel obstruction. Surgery consulted.   Assessment & Plan:   Principal Problem:   Right inguinal hernia - no evidence of obstruction - per surgery team, no indication for urgent surgical intervention at this time but if pt would need this, tertiary medical center recommended  - due to worsening ascites, did not have good appetite this AM     Abd ascites - IR consulted for paracentesis - plan for paracentesis for today     Urinary retention - urology assistance appreciated - placed on Tamsulosin, outpatient follow up recommended for consideration of cystoscopy     ? Ascending colitis  - noted on CT abd  - there was also ? Of acute cholecystitis on US but clinically does not correlate  - abd is somewhat distended but likely from ascites  - continue with Cipro and Flagyl day #7/7  Active Problems:   Alcohol abuse, acute metabolic encephalopathy  - no signs of withdrawal, keep on CIWA    Non sustained v-tach - 4 runs, pt asymptomatic, says no chest pain     Cirrhosis, alcoholic (HCC), Hep C, transaminitis, coagulopathy  - high risk for surgical interventions - ongoing alcohol use  - pt counseled on cessation     Anemia of chronic disease, thrombocytopenia, now presented with melena  - in the setting of alcoholic cirrhosis, alcohol induced bone marrow damage  - pt says he has not seen any blood in stool since admission - transfused 1 U PRBC 6/24, Hg up post transfusion from 6.8 --> 8.1 - Plt's overall stable  - CBC  in AM    Hypokalemia and hypomagnesemia - Mg is low, will supplement - check in AM - K is WNL - BMP in AM    Hyponatremia, ascites  - slightly better this AM - will give one more dose of lasix due to ascites  - BMP In AM  DVT prophylaxis: SCD Code Status: Full  Family Communication: pt at bedside  Disposition Plan: home when medically stable   Consultants:   Surgery   IR  Urology   Procedures:   None  Antimicrobials:   Cipro 6/19 --<  Flagyl 6/19 -->  Subjective: Bothered with abd distension.  Objective: Vitals:   11/26/16 1614 11/26/16 2202 11/27/16 0626 11/27/16 1204  BP:  110/60 112/69 120/83  Pulse:  87 84   Resp:  19 19   Temp:  98 F (36.7 C) 98.3 F (36.8 C)   TempSrc:  Oral Oral   SpO2:  99% 98%   Weight: 74.6 kg (164 lb 7.4 oz)     Height: 6\' 2"  (1.88 m)       Intake/Output Summary (Last 24 hours) at 11/27/16 1206 Last data filed at 11/27/16 1100  Gross per 24 hour  Intake             1615 ml  Output              850 ml  Net  765 ml   Filed Weights   11/21/16 1227 11/22/16 0037 11/26/16 1614  Weight: 74.8 kg (165 lb) 63.2 kg (139 lb 4.8 oz) 74.6 kg (164 lb 7.4 oz)   Physical Exam  Constitutional: Appears calm, NAD CVS: RRR, S1/S2 +, no murmurs, no gallops, no carotid bruit.  Pulmonary: Effort and breath sounds normal, diminished breath sounds at bases  Abdominal: distended with fluid shift, non tender  Musculoskeletal: Normal range of motion. No edema and no tenderness.  Lymphadenopathy: No lymphadenopathy noted, cervical, inguinal.  Data Reviewed: I have personally reviewed following labs and imaging studies  CBC:  Recent Labs Lab 11/21/16 1636  11/23/16 0441 11/24/16 0505 11/25/16 0521 11/26/16 0502 11/27/16 0724  WBC 10.2  < > 8.2 6.5 6.7 7.8 9.2  NEUTROABS 7.2  --   --   --   --   --   --   HGB 8.1*  < > 7.0* 7.3* 7.1* 6.8* 8.1*  HCT 24.8*  < > 21.2* 22.2* 21.8* 20.5* 23.6*  MCV 79.0  < > 76.8* 79.0  76.5* 75.6* 75.4*  PLT 103*  < > 106* 117* 116* 121* 112*  < > = values in this interval not displayed. Basic Metabolic Panel:  Recent Labs Lab 11/22/16 0119  11/23/16 0441 11/24/16 0505 11/25/16 0521 11/26/16 0502 11/27/16 0724  NA 133*  < > 131* 131* 132* 128* 129*  K 3.0*  < > 4.2 4.4 4.2 4.0 4.0  CL 105  < > 104 104 104 103 102  CO2 18*  < > 22 24 22 22  20*  GLUCOSE 75  < > 67 85 82 98 85  BUN 9  < > 9 7 6 7 8   CREATININE 0.90  < > 0.81 0.77 0.83 0.86 0.94  CALCIUM 7.7*  < > 7.8* 7.7* 7.7* 7.8* 7.7*  MG 1.6*  --   --  1.6*  --  1.8 1.5*  < > = values in this interval not displayed. Liver Function Tests:  Recent Labs Lab 11/21/16 1636 11/22/16 0119 11/23/16 0441 11/24/16 0505 11/27/16 0724  AST 52* 41 46* 50* 44*  ALT 22 18 18 19  16*  ALKPHOS 89 84 87 74 70  BILITOT 8.9* 8.0*  8.5* 4.6* 4.6* 5.7*  PROT 7.5 6.8 6.3* 6.6 6.0*  ALBUMIN 2.6* 2.5* 2.3* 2.3* 2.1*    Recent Labs Lab 11/21/16 1636  LIPASE 30    Recent Labs Lab 11/21/16 2112  AMMONIA 50*   Coagulation Profile:  Recent Labs Lab 11/21/16 2024  INR 1.81   Urine analysis:    Component Value Date/Time   COLORURINE AMBER (A) 11/22/2016 1550   APPEARANCEUR CLEAR 11/22/2016 1550   LABSPEC >1.046 (H) 11/22/2016 1550   PHURINE 6.0 11/22/2016 1550   GLUCOSEU NEGATIVE 11/22/2016 1550   HGBUR NEGATIVE 11/22/2016 1550   BILIRUBINUR SMALL (A) 11/22/2016 1550   KETONESUR 5 (A) 11/22/2016 1550   PROTEINUR NEGATIVE 11/22/2016 1550   UROBILINOGEN 0.2 12/21/2014 0829   NITRITE NEGATIVE 11/22/2016 1550   LEUKOCYTESUR NEGATIVE 11/22/2016 1550   Recent Results (from the past 240 hour(s))  MRSA PCR Screening     Status: None   Collection Time: 11/22/16 12:47 AM  Result Value Ref Range Status   MRSA by PCR NEGATIVE NEGATIVE Final    Comment:        The GeneXpert MRSA Assay (FDA approved for NASAL specimens only), is one component of a comprehensive MRSA colonization surveillance program. It is  not intended to  diagnose MRSA infection nor to guide or monitor treatment for MRSA infections.     Radiology Studies: Ct Abdomen Pelvis W Contrast Result Date: 11/21/2016 Wall thickening along the ascending colon raises concern for acute infectious or inflammatory colitis, though it could be chronic in nature. 2. Herniation of a long segment of distal ileum into a large right inguinal hernia, with mild associated soft tissue inflammation and a small amount of free fluid. No evidence of bowel obstruction. 3. Findings of hepatic cirrhosis, with re- cannulization of the umbilical vein, and scattered gastric and splenic varices. 4. Small volume ascites within the abdomen and pelvis. 5. Scattered aortic atherosclerosis. 6. Cholelithiasis, with mild gallbladder distention. Gallbladder otherwise unremarkable.   US Abdomen Limited Ruq Result Date: 11/21/2016  Cholelithiasis. 2. Mild gallbladder wall thickening and pericholecystic fluid is indeterminate for acute cholecystitis in the setting of ascites. Negative sonographic Murphy's sign. 3. Liver cirrhosis.  Scheduled Meds: . lidocaine (PF)      . chlorhexidine  15 mL Mouth Rinse BID  . folic acid  1 mg Oral Daily  . mouth rinse  15 mL Mouth Rinse q12n4p  . multivitamin with minerals  1 tablet Oral Daily  . nicotine  21 mg Transdermal Daily  . pantoprazole  40 mg Oral BID  . sodium chloride flush  3 mL Intravenous Q12H  . thiamine  100 mg Oral Daily   Continuous Infusions: . sodium chloride    . ciprofloxacin Stopped (11/27/16 1100)  . metronidazole Stopped (11/27/16 0708)    LOS: 6 days   Time spent: 25 minutes  Debbora Presto, MD [facility] Pager [phone]  If 7PM-7AM, please contact night-coverage [email or web address] Password [facility] 11/27/2016, 12:06 PM

## 2016-11-27 NOTE — Progress Notes (Signed)
Central telemetry called to inform RN that pt had 4 beats of v-tach.  RN went in to assess pt and pt is sleeping comfortably.  MD notified and no new orders received at this time.  Will continue to monitor.  Hector ShadeMoss, Chavela Justiniano [place]

## 2016-11-27 NOTE — Care Management Note (Signed)
Case Management Note  Patient Details  Name: Alfred [name] MRN: [id number] Date of Birth: 1950/12/25  Subjective/Objective:                    Action/Plan:  PT recommending HHPT. Patient has Medicaid and a non qualifying Medicaid Dx for HHPT. Unable to arrange HHPT Expected Discharge Date:                  Expected Discharge Plan:  Home/Self Care  In-House Referral:     Discharge planning Services  CM Consult  Post Acute Care Choice:    Choice offered to:     DME Arranged:    DME Agency:     HH Arranged:    HH Agency:     Status of Service:  In process, will continue to follow  If discussed at Long Length of Stay Meetings, dates discussed:    Additional Comments:  Alfred [name], Alfred Teffeteller Marie, RN 11/27/2016, 8:00 AM

## 2016-11-28 ENCOUNTER — Inpatient Hospital Stay (HOSPITAL_COMMUNITY): Payer: Medicaid Other

## 2016-11-28 LAB — COMPREHENSIVE METABOLIC PANEL
ALT: 15 U/L — ABNORMAL LOW (ref 17–63)
AST: 46 U/L — AB (ref 15–41)
Albumin: 2.2 g/dL — ABNORMAL LOW (ref 3.5–5.0)
Alkaline Phosphatase: 69 U/L (ref 38–126)
Anion gap: 6 (ref 5–15)
BILIRUBIN TOTAL: 5 mg/dL — AB (ref 0.3–1.2)
BUN: 6 mg/dL (ref 6–20)
CO2: 21 mmol/L — ABNORMAL LOW (ref 22–32)
CREATININE: 1.04 mg/dL (ref 0.61–1.24)
Calcium: 7.9 mg/dL — ABNORMAL LOW (ref 8.9–10.3)
Chloride: 101 mmol/L (ref 101–111)
GFR calc Af Amer: >60 mL/min (ref >60)
Glucose, Bld: 91 mg/dL (ref 65–99)
POTASSIUM: 4 mmol/L (ref 3.5–5.1)
Sodium: 128 mmol/L — ABNORMAL LOW (ref 135–145)
Total Protein: 6 g/dL — ABNORMAL LOW (ref 6.5–8.1)

## 2016-11-28 LAB — CBC
HEMATOCRIT: 23.8 % — AB (ref 39.0–52.0)
Hemoglobin: 8.1 g/dL — ABNORMAL LOW (ref 13.0–17.0)
MCH: 25.9 pg — ABNORMAL LOW (ref 26.0–34.0)
MCHC: 34 g/dL (ref 30.0–36.0)
MCV: 76 fL — AB (ref 78.0–100.0)
Platelets: 127 10*3/uL — ABNORMAL LOW (ref 150–400)
RBC: 3.13 MIL/uL — ABNORMAL LOW (ref 4.22–5.81)
RDW: 22.1 % — AB (ref 11.5–15.5)
WBC: 9.6 10*3/uL (ref 4.0–10.5)

## 2016-11-28 LAB — PH, BODY FLUID: PH, BODY FLUID: 8.2

## 2016-11-28 LAB — MAGNESIUM: Magnesium: 1.8 mg/dL (ref 1.7–2.4)

## 2016-11-28 LAB — TRIGLYCERIDES, BODY FLUIDS: Triglycerides, Fluid: 28 mg/dL

## 2016-11-28 MED ORDER — METOPROLOL TARTRATE 25 MG PO TABS
25.0000 mg | ORAL_TABLET | Freq: Two times a day (BID) | ORAL | Status: DC
  Administered 2016-11-28 (×2): 25 mg via ORAL
  Filled 2016-11-28 (×2): qty 1

## 2016-11-28 MED ORDER — ALBUMIN HUMAN 25 % IV SOLN
12.5000 g | Freq: Once | INTRAVENOUS | Status: AC
  Administered 2016-11-29: 12.5 g via INTRAVENOUS
  Filled 2016-11-28: qty 50

## 2016-11-28 MED ORDER — PANTOPRAZOLE SODIUM 40 MG PO TBEC: 40.0000 mg | DELAYED_RELEASE_TABLET | Freq: Two times a day (BID) | ORAL | Status: DC

## 2016-11-28 NOTE — Progress Notes (Signed)
Physical Therapy Treatment Patient Details Name: Alfred [name] MRN: [id number] DOB: 07-04-1950 Today's Date: 11/28/2016    History of Present Illness Pt is 43 male with known hx of alcohol abuse, hep C, alcoholic cirrhosis, tobacco abuse, presented with right groin pain. On admission, imaging studies worrisome for herniation of a long segment of distal ileum into a large right inguinal hernia    PT Comments    Patient today making excellent progress with ambulation, with ability to ambulate 1 full lap with only using IV pole for support. NO LOB or instability noted, however did require VC to increase stride length and general safety with pushing IV pole. Patient able to maintain O2 sats above 90% on RA during ambulation. Patient provided continued instruction on HEP and safety with gait. PT to continue to follow patient to allow for safe return home.    Follow Up Recommendations  Home health PT     Equipment Recommendations  Rolling walker with 5" wheels    Recommendations for Other Services       Precautions / Restrictions Precautions Precautions: Fall Precaution Comments: watch sats with gait Restrictions Weight Bearing Restrictions: No    Mobility  Bed Mobility Overal bed mobility: Modified Independent                Transfers Overall transfer level: Needs assistance   Transfers: Sit to/from Stand Sit to Stand: Supervision         General transfer comment: good hand placement on bed to push up  Ambulation/Gait Ambulation/Gait assistance: Supervision Ambulation Distance (Feet): 500 Feet Assistive device:  (IV pole) Gait Pattern/deviations: Step-through pattern;Decreased stride length     General Gait Details: VC for good standing balance as well as increased step length    Stairs            Wheelchair Mobility    Modified Rankin (Stroke Patients Only)       Balance Overall balance assessment: Needs assistance Sitting-balance support:  Single extremity supported                                        Cognition Arousal/Alertness: Awake/alert Behavior During Therapy: WFL for tasks assessed/performed Overall Cognitive Status: Within Functional Limits for tasks assessed                                        Exercises General Exercises - Lower Extremity Long Arc Quad: Both;10 reps;Seated Straight Leg Raises: Both;10 reps;Supine Hip Flexion/Marching: Both;Standing;10 reps (2 sets)    General Comments        Pertinent Vitals/Pain Pain Assessment: No/denies pain    Home Living                      Prior Function            PT Goals (current goals can now be found in the care plan section) Progress towards PT goals: Progressing toward goals    Frequency           PT Plan Current plan remains appropriate    Co-evaluation              AM-PAC PT "6 Clicks" Daily Activity  Outcome Measure  Difficulty turning over in bed (including adjusting bedclothes, sheets and blankets)?: None Difficulty moving  from lying on back to sitting on the side of the bed? : None Difficulty sitting down on and standing up from a chair with arms (e.g., wheelchair, bedside commode, etc,.)?: None Help needed moving to and from a bed to chair (including a wheelchair)?: None Help needed walking in hospital room?: A Little Help needed climbing 3-5 steps with a railing? : None 6 Click Score: 23    End of Session Equipment Utilized During Treatment: Gait belt Activity Tolerance: Patient tolerated treatment well Patient left: in bed;with call bell/phone within reach Nurse Communication: Mobility status PT Visit Diagnosis: Muscle weakness (generalized) (M62.81);Pain;Difficulty in walking, not elsewhere classified (R26.2)     Time: [id number] PT Time Calculation (min) (ACUTE ONLY): 19 min  Charges:  $Gait Training: 8-22 mins                    G Codes:      Kipp LaurenceStephanie R Lyon Dumont,  PT, DPT 11/28/16 12:22 PM

## 2016-11-28 NOTE — Progress Notes (Signed)
Patient ID: Alfred [name], male   DOB: 1950/09/05, 66 y.o.   MRN: [id number]    PROGRESS NOTE  Alfred [name]  WUJ:[id number] DOB: 1950/09/05 DOA: 11/21/2016  PCP: Patient, No Pcp Per   Brief Narrative:  Pt is [age] male with known hx of alcohol abuse, hep C, alcoholic cirrhosis, tobacco abuse, presented with right groin pain. On admission, imaging studies worrisome for herniation of a long segment of distal ileum into a large right inguinal hernia, no evidence of bowel obstruction. Surgery consulted.   Assessment & Plan:   Principal Problem:   Right inguinal hernia - no evidence of obstruction - per surgery team, no indication for urgent surgical intervention at this time but if pt would need this, tertiary medical center recommended  - reports feeling better after paracentesis    Abd ascites - IR consulted for paracentesis, 1.8 L fluid removed 6/25 - ask IR to see if more fluid can be pulled out as abd is still distended     Urinary retention - urology assistance appreciated - placed on Tamsulosin, outpatient follow up recommended for consideration of cystoscopy     ? Ascending colitis  - noted on CT abd  - there was also ? Of acute cholecystitis on US but clinically does not correlate  - abd is somewhat distended but likely from ascites  - Completed therapy with Cipro 7 days therapy   Active Problems:   Alcohol abuse, acute metabolic encephalopathy  - no signs of withdrawal, keep on CIWA    Non sustained v-tach on 6/25  - 4 runs, pt asymptomatic, says no chest pain     Cirrhosis, alcoholic (HCC), Hep C, transaminitis, coagulopathy  - high risk for surgical interventions - ongoing alcohol use  - pt counseled on cessation     Anemia of chronic disease, thrombocytopenia, now presented with melena  - in the setting of alcoholic cirrhosis, alcohol induced bone marrow damage  - pt says he has not seen any blood in stool since admission - transfused 1 U PRBC 6/24, Hg up post  transfusion from 6.8 --> 8.1 --> 8.1 - Plt's overall stable  - CBC in AM    Hypokalemia and hypomagnesemia - both electrolytes supplemented, stable     Hyponatremia, ascites  - has been ~ 128, keep on lasix for now - BMP in AM  DVT prophylaxis: SCD Code Status: Full  Family Communication: pt at bedside  Disposition Plan: home when medically stable   Consultants:   Surgery   IR  Urology   Procedures:   None  Antimicrobials:   Cipro 6/19 --> 6/25  Flagyl 6/19 --> 6/25  Subjective: Feels better but still with abd distension.   Objective: Vitals:   11/27/16 2112 11/28/16 0500 11/28/16 0611 11/28/16 1432  BP: 106/62  117/68 107/66  Pulse: (!) 105  97 88  Resp: 19  18 18   Temp: 98.1 F (36.7 C)  98.2 F (36.8 C) 98.5 F (36.9 C)  TempSrc: Oral  Oral Oral  SpO2: 99%  98% 99%  Weight:  72.4 kg (159 lb 9.8 oz)    Height:        Intake/Output Summary (Last 24 hours) at 11/28/16 1501 Last data filed at 11/28/16 1433  Gross per 24 hour  Intake             2390 ml  Output             1300 ml  Net  1090 ml   Filed Weights   11/26/16 1614 11/27/16 1945 11/28/16 0500  Weight: 74.6 kg (164 lb 7.4 oz) 74.2 kg (163 lb 9.3 oz) 72.4 kg (159 lb 9.8 oz)   Physical Exam  Constitutional: Appears calm, NAD CVS: RRR, S1/S2 +, no gallops, no carotid bruit.  Pulmonary: Effort and breath sounds normal, no stridor, rhonchi, wheezes, rales.  Abdominal: Soft. BS +,  abd distended with fluid wave and dullness to percussion  Musculoskeletal: Normal range of motion. No edema and no tenderness.  Lymphadenopathy: No lymphadenopathy noted, cervical, inguinal. Neuro: Alert. Normal reflexes, muscle tone coordination. No cranial nerve deficit. Skin: Skin is warm and dry. No rash noted. Not diaphoretic. No erythema. No pallor.  Psychiatric: Normal mood and affect. Behavior, judgment, thought content normal.    Data Reviewed: I have personally reviewed following labs and  imaging studies  CBC:  Recent Labs Lab 11/21/16 1636  11/24/16 0505 11/25/16 0521 11/26/16 0502 11/27/16 0724 11/28/16 0738  WBC 10.2  < > 6.5 6.7 7.8 9.2 9.6  NEUTROABS 7.2  --   --   --   --   --   --   HGB 8.1*  < > 7.3* 7.1* 6.8* 8.1* 8.1*  HCT 24.8*  < > 22.2* 21.8* 20.5* 23.6* 23.8*  MCV 79.0  < > 79.0 76.5* 75.6* 75.4* 76.0*  PLT 103*  < > 117* 116* 121* 112* 127*  < > = values in this interval not displayed. Basic Metabolic Panel:  Recent Labs Lab 11/22/16 0119  11/24/16 0505 11/25/16 1610 11/26/16 0502 11/27/16 0724 11/28/16 0738  NA 133*  < > 131* 132* 128* 129* 128*  K 3.0*  < > 4.4 4.2 4.0 4.0 4.0  CL 105  < > 104 104 103 102 101  CO2 18*  < > 24 22 22  20* 21*  GLUCOSE 75  < > 85 82 98 85 91  BUN 9  < > 7 6 7 8 6   CREATININE 0.90  < > 0.77 0.83 0.86 0.94 1.04  CALCIUM 7.7*  < > 7.7* 7.7* 7.8* 7.7* 7.9*  MG 1.6*  --  1.6*  --  1.8 1.5* 1.8  < > = values in this interval not displayed. Liver Function Tests:  Recent Labs Lab 11/22/16 0119 11/23/16 0441 11/24/16 0505 11/27/16 0724 11/27/16 1500 11/28/16 0738  AST 41 46* 50* 44*  --  46*  ALT 18 18 19  16*  --  15*  ALKPHOS 84 87 74 70  --  69  BILITOT 8.0*  8.5* 4.6* 4.6* 5.7* 6.1* 5.0*  PROT 6.8 6.3* 6.6 6.0*  --  6.0*  ALBUMIN 2.5* 2.3* 2.3* 2.1*  --  2.2*    Recent Labs Lab 11/21/16 1636  LIPASE 30    Recent Labs Lab 11/21/16 2112  AMMONIA 50*   Coagulation Profile:  Recent Labs Lab 11/21/16 2024  INR 1.81   Urine analysis:    Component Value Date/Time   COLORURINE AMBER (A) 11/22/2016 1550   APPEARANCEUR CLEAR 11/22/2016 1550   LABSPEC >1.046 (H) 11/22/2016 1550   PHURINE 6.0 11/22/2016 1550   GLUCOSEU NEGATIVE 11/22/2016 1550   HGBUR NEGATIVE 11/22/2016 1550   BILIRUBINUR SMALL (A) 11/22/2016 1550   KETONESUR 5 (A) 11/22/2016 1550   PROTEINUR NEGATIVE 11/22/2016 1550   UROBILINOGEN 0.2 12/21/2014 0829   NITRITE NEGATIVE 11/22/2016 1550   LEUKOCYTESUR NEGATIVE  11/22/2016 1550   Recent Results (from the past 240 hour(s))  MRSA PCR Screening  Status: None   Collection Time: 11/22/16 12:47 AM  Result Value Ref Range Status   MRSA by PCR NEGATIVE NEGATIVE Final    Comment:        The GeneXpert MRSA Assay (FDA approved for NASAL specimens only), is one component of a comprehensive MRSA colonization surveillance program. It is not intended to diagnose MRSA infection nor to guide or monitor treatment for MRSA infections.   Gram stain     Status: None   Collection Time: 11/27/16 12:44 PM  Result Value Ref Range Status   Specimen Description FLUID PERITONEAL  Final   Special Requests NONE  Final   Gram Stain NO WBC SEEN NO ORGANISMS SEEN   Final   Report Status 11/27/2016 FINAL  Final    Radiology Studies: Ct Abdomen Pelvis W Contrast Result Date: 11/21/2016 Wall thickening along the ascending colon raises concern for acute infectious or inflammatory colitis, though it could be chronic in nature. 2. Herniation of a long segment of distal ileum into a large right inguinal hernia, with mild associated soft tissue inflammation and a small amount of free fluid. No evidence of bowel obstruction. 3. Findings of hepatic cirrhosis, with re- cannulization of the umbilical vein, and scattered gastric and splenic varices. 4. Small volume ascites within the abdomen and pelvis. 5. Scattered aortic atherosclerosis. 6. Cholelithiasis, with mild gallbladder distention. Gallbladder otherwise unremarkable.   US Abdomen Limited Ruq Result Date: 11/21/2016  Cholelithiasis. 2. Mild gallbladder wall thickening and pericholecystic fluid is indeterminate for acute cholecystitis in the setting of ascites. Negative sonographic Murphy's sign. 3. Liver cirrhosis.  Scheduled Meds: . chlorhexidine  15 mL Mouth Rinse BID  . folic acid  1 mg Oral Daily  . furosemide  20 mg Intravenous Daily  . mouth rinse  15 mL Mouth Rinse q12n4p  . multivitamin with minerals  1 tablet  Oral Daily  . nicotine  21 mg Transdermal Daily  . pantoprazole  40 mg Oral BID  . sodium chloride flush  3 mL Intravenous Q12H  . thiamine  100 mg Oral Daily   Continuous Infusions: . sodium chloride    . albumin human    . ciprofloxacin Stopped (11/28/16 1037)  . metronidazole 500 mg (11/28/16 1448)    LOS: 7 days   Time spent: 25 minutes   Debbora Presto, MD [facility] Pager [phone]  If 7PM-7AM, please contact night-coverage [email or web address] Password [email or web address] 11/28/2016, 3:01 PM

## 2016-11-28 NOTE — Care Management Note (Addendum)
Case Management Note  Patient Details  Name: Alfred [name] MRN: [id number] Date of Birth: 09-Apr-1951  Subjective/Objective:                    Action/Plan:  See previous note. Explained to patient he does not have Medicaid qualifying DX for HHPT . Patient voiced understanding .   [phone], on December 14, 2016 at 1400, with Dr Lily KocherGomez.  Confirmed face sheet information with patient. Patient already has walker at home. Expected Discharge Date:                  Expected Discharge Plan:  Home w Home Health Services  In-House Referral:     Discharge planning Services  CM Consult  Post Acute Care Choice:    Choice offered to:  Patient  DME Arranged:    DME Agency:     HH Arranged:  RN, Nurse's Aide HH [facility]  Status of Service:  In process, will continue to follow  If discussed at Long Length of Stay Meetings, dates discussed:    Additional Comments:  Alfred [name], Alfred Dyar Marie, RN 11/28/2016, 4:03 PM

## 2016-11-29 ENCOUNTER — Encounter (HOSPITAL_COMMUNITY): Payer: Self-pay | Admitting: Student

## 2016-11-29 ENCOUNTER — Inpatient Hospital Stay (HOSPITAL_COMMUNITY): Payer: Medicaid Other

## 2016-11-29 DIAGNOSIS — E43 Unspecified severe protein-calorie malnutrition: Secondary | ICD-10-CM | POA: Insufficient documentation

## 2016-11-29 HISTORY — PX: IR PARACENTESIS: IMG2679

## 2016-11-29 LAB — CBC
HCT: 23 % — ABNORMAL LOW (ref 39.0–52.0)
HEMOGLOBIN: 7.9 g/dL — AB (ref 13.0–17.0)
MCH: 26.1 pg (ref 26.0–34.0)
MCHC: 34.3 g/dL (ref 30.0–36.0)
MCV: 75.9 fL — ABNORMAL LOW (ref 78.0–100.0)
Platelets: 138 10*3/uL — ABNORMAL LOW (ref 150–400)
RBC: 3.03 MIL/uL — AB (ref 4.22–5.81)
RDW: 22.6 % — ABNORMAL HIGH (ref 11.5–15.5)
WBC: 9 10*3/uL (ref 4.0–10.5)

## 2016-11-29 LAB — COMPREHENSIVE METABOLIC PANEL
ALBUMIN: 2 g/dL — AB (ref 3.5–5.0)
ALK PHOS: 73 U/L (ref 38–126)
ALT: 16 U/L — ABNORMAL LOW (ref 17–63)
ANION GAP: 4 — AB (ref 5–15)
AST: 44 U/L — ABNORMAL HIGH (ref 15–41)
BUN: 10 mg/dL (ref 6–20)
CALCIUM: 7.6 mg/dL — AB (ref 8.9–10.3)
CO2: 24 mmol/L (ref 22–32)
Chloride: 100 mmol/L — ABNORMAL LOW (ref 101–111)
Creatinine, Ser: 0.92 mg/dL (ref 0.61–1.24)
GFR calc Af Amer: >60 mL/min (ref >60)
GFR calc non Af Amer: >60 mL/min (ref >60)
GLUCOSE: 91 mg/dL (ref 65–99)
POTASSIUM: 3.7 mmol/L (ref 3.5–5.1)
SODIUM: 128 mmol/L — AB (ref 135–145)
Total Bilirubin: 3.3 mg/dL — ABNORMAL HIGH (ref 0.3–1.2)
Total Protein: 5.6 g/dL — ABNORMAL LOW (ref 6.5–8.1)

## 2016-11-29 LAB — MAGNESIUM: Magnesium: 1.6 mg/dL — ABNORMAL LOW (ref 1.7–2.4)

## 2016-11-29 MED ORDER — ONDANSETRON HCL 4 MG PO TABS: 4.0000 mg | ORAL_TABLET | Freq: Four times a day (QID) | ORAL | 0 refills | Status: AC | PRN

## 2016-11-29 MED ORDER — MAGNESIUM SULFATE 2 GM/50ML IV SOLN
2.0000 g | Freq: Once | INTRAVENOUS | Status: AC
  Administered 2016-11-29: 2 g via INTRAVENOUS
  Filled 2016-11-29: qty 50

## 2016-11-29 MED ORDER — ENSURE ENLIVE PO LIQD
237.0000 mL | Freq: Two times a day (BID) | ORAL | Status: DC
  Administered 2016-11-29: 237 mL via ORAL

## 2016-11-29 MED ORDER — HYDROCODONE-ACETAMINOPHEN 5-325 MG PO TABS: 1.0000 | ORAL_TABLET | ORAL | 0 refills | Status: AC | PRN

## 2016-11-29 MED ORDER — LIDOCAINE HCL (PF) 1 % IJ SOLN
INTRAMUSCULAR | Status: AC
  Filled 2016-11-29: qty 30

## 2016-11-29 MED ORDER — FOLIC ACID 1 MG PO TABS: 1.0000 mg | ORAL_TABLET | Freq: Every day | ORAL | 1 refills | Status: AC

## 2016-11-29 NOTE — Progress Notes (Signed)
Patient ID: Alfred [name], male   DOB: 1950/10/23, 66 y.o.   MRN: [id number]    PROGRESS NOTE  Alfred [name]  WUJ:[id number] DOB: [birth year] DOA: 11/21/2016  PCP: Patient, No Pcp Per   Brief Narrative:  Pt is 59 male with known hx of alcohol abuse, hep C, alcoholic cirrhosis, tobacco abuse, presented with right groin pain. On admission, imaging studies worrisome for herniation of a long segment of distal ileum into a large right inguinal hernia, no evidence of bowel obstruction. Surgery consulted.   Assessment & Plan:   Principal Problem:   Right inguinal hernia - no evidence of obstruction - per surgery team, no indication for urgent surgical intervention at this time but if pt would need this, tertiary medical center recommended  - reports feeling better after paracentesis - second paracentesis done today, 2 L fluid removed     Abd ascites - IR consulted for paracentesis, 1.8 L fluid removed 6/25 - second paracentesis done today, 2 L fluid removed     Urinary retention - urology assistance appreciated - placed on Tamsulosin, outpatient follow up recommended for consideration of cystoscopy     ? Ascending colitis  - noted on CT abd  - there was also ? Of acute cholecystitis on [place] but clinically does not correlate  - abd is somewhat distended but likely from ascites  - Completed therapy with Cipro 7 days therapy   Active Problems:   Alcohol abuse, acute metabolic encephalopathy  - no signs of withdrawal, keep on CIWA    Non sustained v-tach on 6/25  - 4 runs, pt asymptomatic, says no chest pain     Cirrhosis, alcoholic (HCC), Hep C, transaminitis, coagulopathy  - high risk for surgical interventions - ongoing alcohol use  - pt counseled on cessation     Anemia of chronic disease, thrombocytopenia, now presented with melena  - in the setting of alcoholic cirrhosis, alcohol induced bone marrow damage  - pt says he has not seen any blood in stool since admission -  transfused 1 U PRBC 6/24, Hg up post transfusion from 6.8 --> 8.1 --> 8.1 --> 7.9 - Plt's overall stable  - CBC in AM    Hypokalemia and hypomagnesemia - Mg low, will supplement and repeat Mg level in AM    Hyponatremia, ascites  - has been ~ 128, holding lasix for now - BMP in AM    Hypotension - holding home regimen metoprolol and clonidine until BP stabilizes  - also holding Lasix     Severe PCM - provide nutritional supplements   DVT prophylaxis: SCD Code Status: Full  Family Communication: pt at bedside  Disposition Plan: home in AM  Consultants:   Surgery   IR  Urology   Procedures:   None  Antimicrobials:   Cipro 6/19 --> 6/25  Flagyl 6/19 --> 6/25  Subjective: Pt still with abd distension.   Objective: Vitals:   11/28/16 1432 11/28/16 2107 11/29/16 0514 11/29/16 1325  BP: 107/66 93/63 (!) 90/57 (!) 98/58  Pulse: 88 73 78 78  Resp: 18 20 20 18   Temp: 98.5 F (36.9 C) 98.7 F (37.1 C) 98.5 F (36.9 C) 98.1 F (36.7 C)  TempSrc: Oral Oral Oral Oral  SpO2: 99% 97% 97% 100%  Weight:      Height:        Intake/Output Summary (Last 24 hours) at 11/29/16 1954 Last data filed at 11/29/16 1915  Gross per 24 hour  Intake  725 ml  Output             2350 ml  Net            -1625 ml   Filed Weights   11/26/16 1614 11/27/16 1945 11/28/16 0500  Weight: 74.6 kg (164 lb 7.4 oz) 74.2 kg (163 lb 9.3 oz) 72.4 kg (159 lb 9.8 oz)   Physical Exam  Constitutional: Appears calm, NAD CVS: RRR, S1/S2 +, no murmurs, no gallops, no carotid bruit.  Pulmonary: Effort and breath sounds normal, no stridor, rhonchi, wheezes, rales.  Abdominal: Soft. BS +,  notable distension, fluid shift  Musculoskeletal: Normal range of motion. No edema and no tenderness.   Data Reviewed: I have personally reviewed following labs and imaging studies  CBC:  Recent Labs Lab 11/25/16 0521 11/26/16 0502 11/27/16 0724 11/28/16 0738 11/29/16 0339  WBC 6.7 7.8 9.2  9.6 9.0  HGB 7.1* 6.8* 8.1* 8.1* 7.9*  HCT 21.8* 20.5* 23.6* 23.8* 23.0*  MCV 76.5* 75.6* 75.4* 76.0* 75.9*  PLT 116* 121* 112* 127* 138*   Basic Metabolic Panel:  Recent Labs Lab 11/24/16 0505 11/25/16 0521 11/26/16 0502 11/27/16 0724 11/28/16 0738 11/29/16 0339  NA 131* 132* 128* 129* 128* 128*  K 4.4 4.2 4.0 4.0 4.0 3.7  CL 104 104 103 102 101 100*  CO2 24 22 22  20* 21* 24  GLUCOSE 85 82 98 85 91 91  BUN 7 6 7 8 6 10   CREATININE 0.77 0.83 0.86 0.94 1.04 0.92  CALCIUM 7.7* 7.7* 7.8* 7.7* 7.9* 7.6*  MG 1.6*  --  1.8 1.5* 1.8 1.6*   Liver Function Tests:  Recent Labs Lab 11/23/16 0441 11/24/16 0505 11/27/16 0724 11/27/16 1500 11/28/16 0738 11/29/16 0339  AST 46* 50* 44*  --  46* 44*  ALT 18 19 16*  --  15* 16*  ALKPHOS 87 74 70  --  69 73  BILITOT 4.6* 4.6* 5.7* 6.1* 5.0* 3.3*  PROT 6.3* 6.6 6.0*  --  6.0* 5.6*  ALBUMIN 2.3* 2.3* 2.1*  --  2.2* 2.0*   Urine analysis:    Component Value Date/Time   COLORURINE AMBER (A) 11/22/2016 1550   APPEARANCEUR CLEAR 11/22/2016 1550   LABSPEC >1.046 (H) 11/22/2016 1550   PHURINE 6.0 11/22/2016 1550   GLUCOSEU NEGATIVE 11/22/2016 1550   HGBUR NEGATIVE 11/22/2016 1550   BILIRUBINUR SMALL (A) 11/22/2016 1550   KETONESUR 5 (A) 11/22/2016 1550   PROTEINUR NEGATIVE 11/22/2016 1550   UROBILINOGEN 0.2 12/21/2014 0829   NITRITE NEGATIVE 11/22/2016 1550   LEUKOCYTESUR NEGATIVE 11/22/2016 1550   Recent Results (from the past 240 hour(s))  MRSA PCR Screening     Status: None   Collection Time: 11/22/16 12:47 AM  Result Value Ref Range Status   MRSA by PCR NEGATIVE NEGATIVE Final    Comment:        The GeneXpert MRSA Assay (FDA approved for NASAL specimens only), is one component of a comprehensive MRSA colonization surveillance program. It is not intended to diagnose MRSA infection nor to guide or monitor treatment for MRSA infections.   Culture, body fluid-bottle     Status: None (Preliminary result)   Collection  Time: 11/27/16 12:44 PM  Result Value Ref Range Status   Specimen Description FLUID PERITONEAL  Final   Special Requests NONE  Final   Culture NO GROWTH 2 DAYS  Final   Report Status PENDING  Incomplete  Gram stain     Status: None  Collection Time: 11/27/16 12:44 PM  Result Value Ref Range Status   Specimen Description FLUID PERITONEAL  Final   Special Requests NONE  Final   Gram Stain NO WBC SEEN NO ORGANISMS SEEN   Final   Report Status 11/27/2016 FINAL  Final    Radiology Studies: Ct Abdomen Pelvis W Contrast Result Date: 11/21/2016 Wall thickening along the ascending colon raises concern for acute infectious or inflammatory colitis, though it could be chronic in nature. 2. Herniation of a long segment of distal ileum into a large right inguinal hernia, with mild associated soft tissue inflammation and a small amount of free fluid. No evidence of bowel obstruction. 3. Findings of hepatic cirrhosis, with re- cannulization of the umbilical vein, and scattered gastric and splenic varices. 4. Small volume ascites within the abdomen and pelvis. 5. Scattered aortic atherosclerosis. 6. Cholelithiasis, with mild gallbladder distention. Gallbladder otherwise unremarkable.   US Abdomen Limited Ruq Result Date: 11/21/2016  Cholelithiasis. 2. Mild gallbladder wall thickening and pericholecystic fluid is indeterminate for acute cholecystitis in the setting of ascites. Negative sonographic Murphy's sign. 3. Liver cirrhosis.  Scheduled Meds: . lidocaine (PF)      . chlorhexidine  15 mL Mouth Rinse BID  . feeding supplement (ENSURE ENLIVE)  237 mL Oral BID BM  . folic acid  1 mg Oral Daily  . mouth rinse  15 mL Mouth Rinse q12n4p  . multivitamin with minerals  1 tablet Oral Daily  . nicotine  21 mg Transdermal Daily  . pantoprazole  40 mg Oral BID  . sodium chloride flush  3 mL Intravenous Q12H  . thiamine  100 mg Oral Daily   Continuous Infusions: . sodium chloride      LOS: 8 days    Time spent: 25 minutes   Debbora Presto, MD [facility] Pager [phone]  If 7PM-7AM, please contact night-coverage [email or web address] Password [facility] 11/29/2016, 7:54 PM

## 2016-11-29 NOTE — Progress Notes (Signed)
Patient transported down to IR.

## 2016-11-29 NOTE — Progress Notes (Signed)
Initial Nutrition Assessment  DOCUMENTATION CODES:   Severe malnutrition in context of chronic illness  INTERVENTION:   -Ensure Enlive po BID, each supplement provides 350 kcal and 20 grams of protein -Continue MVI daily  NUTRITION DIAGNOSIS:   Malnutrition (Severe) related to chronic illness (cirrhosis, ETOH abuse) as evidenced by energy intake < 75% for > or equal to 1 month, severe depletion of body fat, moderate depletions of muscle mass, severe depletion of muscle mass.  GOAL:   Patient will meet greater than or equal to 90% of their needs  MONITOR:   PO intake, Supplement acceptance, Labs, Weight trends, Skin, I & O's  REASON FOR ASSESSMENT:   Consult Assessment of nutrition requirement/status  ASSESSMENT:   Pt is [age] male with known hx of alcohol abuse, hep C, alcoholic cirrhosis, tobacco abuse, presented with right groin pain. On admission, imaging studies worrisome for herniation of a long segment of distal ileum into a large right inguinal hernia, no evidence of bowel obstruction. Surgery consulted.   Pt admitted with rt inguinal hernia.  Per surgical service notes on 11/22/16, pt with poorly controlled cirrhosis and ETOH abuse and is a high risk surgical candidate; surgery will not be performed.   6/25- s/p paracentesis with 1.8 L fluid removed  Spoke with pt, who reports poor appetite PTA for at least one month. He reports episodes of intermittent nausea and vomiting, which increased in both frequency and duration PTA. Meal intake typically consists of one meal per day (ham and eggs). He shares that his appetite has been slowly returning over the past week; he consumed 100% of breakfast. Noted 50-100% meal completion per doc flowsheets.   Pt denies any weight loss. He reports UBW of 160#.   Nutrition-Focused physical exam completed. Findings are severe (upper arm and thoracic and lumbar region) fat depletion, moderate to severe (temple, clavicle, acromion, scapula,  hand, patella regions) muscle depletion, and no edema.   Discussed importance of good nutritional intake to promote healing. Pt amenable to supplements.   Medications reviewed an include albumin,  MVI, vitamin B-1, and folvite.   Labs reviewed: Na: 128, Mg: 1.6.   Diet Order:  Diet 2 gram sodium Room service appropriate? Yes; Fluid consistency: Thin  Skin:  Reviewed, no issues  Last BM:  11/28/16  Height:   Ht Readings from Last 1 Encounters:  11/27/16 6\' 2"  (1.88 m)    Weight:   Wt Readings from Last 1 Encounters:  11/28/16 159 lb 9.8 oz (72.4 kg)    Ideal Body Weight:  86.4 kg  BMI:  Body mass index is 20.49 kg/m.  Estimated Nutritional Needs:   Kcal:  2100-2300  Protein:  105-120 grams  Fluid:  2.1-2.3 L  EDUCATION NEEDS:   Education needs addressed  Edgel Degnan A. Mayford KnifeWilliams, RD, LDN, CDE Pager: [phone] After hours Pager: [phone]

## 2016-11-29 NOTE — Progress Notes (Signed)
I let patient know around 1530 there were d/c orders in and that I could have him out within an hour and asked if he had a ride, in which he said yes. When I brought him the d/c paperwork around 1620 he said he would not have a ride until tomorrow. I asked if it was just the ride or if we provided him could he get into the house he said no not until tomorrow. I let MD Lenise ArenaMeyers know and she said that was okay that we would just plan for d/c tomorrow.

## 2016-11-29 NOTE — Discharge Instructions (Signed)

## 2016-11-29 NOTE — Procedures (Signed)
PROCEDURE SUMMARY:  Successful US guided paracentesis from left lateral abdomen.  Yielded 2.0 liters of clear, yellow fluid.  No immediate complications.  Pt tolerated well.   Specimen was not sent for labs.  Hoyt KochKacie Sue-Ellen Matthews PA-C 11/29/2016 12:43 PM

## 2016-11-29 NOTE — Progress Notes (Signed)
Patient returned from IR

## 2016-11-30 LAB — COMPREHENSIVE METABOLIC PANEL
ALBUMIN: 2.1 g/dL — AB (ref 3.5–5.0)
ALT: 15 U/L — ABNORMAL LOW (ref 17–63)
ANION GAP: 8 (ref 5–15)
AST: 43 U/L — ABNORMAL HIGH (ref 15–41)
Alkaline Phosphatase: 68 U/L (ref 38–126)
BUN: 10 mg/dL (ref 6–20)
CO2: 21 mmol/L — AB (ref 22–32)
Calcium: 7.9 mg/dL — ABNORMAL LOW (ref 8.9–10.3)
Chloride: 99 mmol/L — ABNORMAL LOW (ref 101–111)
Creatinine, Ser: 0.85 mg/dL (ref 0.61–1.24)
GFR calc Af Amer: >60 mL/min (ref >60)
GFR calc non Af Amer: >60 mL/min (ref >60)
GLUCOSE: 87 mg/dL (ref 65–99)
POTASSIUM: 4 mmol/L (ref 3.5–5.1)
SODIUM: 128 mmol/L — AB (ref 135–145)
Total Bilirubin: 3.8 mg/dL — ABNORMAL HIGH (ref 0.3–1.2)
Total Protein: 5.8 g/dL — ABNORMAL LOW (ref 6.5–8.1)

## 2016-11-30 LAB — LIPASE, FLUID: Lipase-Fluid: 32 U/L

## 2016-11-30 LAB — TOTAL BILIRUBIN, BODY FLUID: TOTBILIFLUID: 0.6 mg/dL

## 2016-11-30 LAB — CBC
HCT: 24.5 % — ABNORMAL LOW (ref 39.0–52.0)
Hemoglobin: 8.4 g/dL — ABNORMAL LOW (ref 13.0–17.0)
MCH: 25.6 pg — ABNORMAL LOW (ref 26.0–34.0)
MCHC: 34.3 g/dL (ref 30.0–36.0)
MCV: 74.7 fL — ABNORMAL LOW (ref 78.0–100.0)
PLATELETS: 133 10*3/uL — AB (ref 150–400)
RBC: 3.28 MIL/uL — ABNORMAL LOW (ref 4.22–5.81)
RDW: 22.6 % — ABNORMAL HIGH (ref 11.5–15.5)
WBC: 8.5 10*3/uL (ref 4.0–10.5)

## 2016-11-30 NOTE — Progress Notes (Signed)
Physical Therapy Treatment Patient Details Name: Alfred [name] MRN: [id number] DOB: 08-May-1951 Today's Date: 11/30/2016    History of Present Illness Pt is [id number] male with known hx of alcohol abuse, hep C, alcoholic cirrhosis, tobacco abuse, presented with right groin pain. On admission, imaging studies worrisome for herniation of a long segment of distal ileum into a large right inguinal hernia    PT Comments    Pt able to ambulate further distance today and able to keep SpO2 at 90% during ambulation. Pt shows understanding of deep breathing techniques while ambulating and safe stair navigation.    Follow Up Recommendations  Home health PT     Equipment Recommendations  None recommended by PT    Recommendations for Other Services       Precautions / Restrictions Precautions Precaution Comments: watch sats with gait Restrictions Weight Bearing Restrictions: No    Mobility  Bed Mobility                  Transfers                    Ambulation/Gait Ambulation/Gait assistance: Min guard Ambulation Distance (Feet): 500 Feet   Gait Pattern/deviations: Step-through pattern;Decreased stride length     General Gait Details: VC for good posture and deep breathing techniques.    Stairs Stairs: Yes   Stair Management: One rail Right;Alternating pattern;Forwards;Step to pattern Number of Stairs: 6 General stair comments: Min guard for safety. Alternating pattern for ascent; step to pattern for descent.   Wheelchair Mobility    Modified Rankin (Stroke Patients Only)       Balance           Standing balance support: Single extremity supported;During functional activity Standing balance-Leahy Scale: Fair Standing balance comment: Pt able to ascend and descend stairs with R UE supported with rail with no LOB.                             Cognition Arousal/Alertness: Awake/alert Behavior During Therapy: WFL for tasks  assessed/performed Overall Cognitive Status: Within Functional Limits for tasks assessed                                        Exercises      General Comments        Pertinent Vitals/Pain Pain Assessment: No/denies pain    Home Living                      Prior Function            PT Goals (current goals can now be found in the care plan section)      Frequency    Min 3X/week      PT Plan Current plan remains appropriate    Co-evaluation              AM-PAC PT "6 Clicks" Daily Activity  Outcome Measure  Difficulty turning over in bed (including adjusting bedclothes, sheets and blankets)?: None Difficulty moving from lying on back to sitting on the side of the bed? : None Difficulty sitting down on and standing up from a chair with arms (e.g., wheelchair, bedside commode, etc,.)?: None Help needed moving to and from a bed to chair (including a wheelchair)?: None Help needed walking in hospital room?: A Little  Help needed climbing 3-5 steps with a railing? : A Little 6 Click Score: 22    End of Session Equipment Utilized During Treatment: Gait belt Activity Tolerance: Patient tolerated treatment well Patient left:  (Pt insisted on going downstairs to meet his ride for DC. Pt left with PT downstairs until ride arrived. ) Nurse Communication: Mobility status PT Visit Diagnosis: Difficulty in walking, not elsewhere classified (R26.2)     Time: 1610-9604 PT Time Calculation (min) (ACUTE ONLY): 8 min  Charges:  $Gait Training: 8-22 mins                    G Codes:       Arneta Cliche, SPT Acute Rehab [phone]    Arneta Cliche 11/30/2016, 9:17 AM

## 2016-11-30 NOTE — Plan of Care (Signed)
Problem: Safety: Goal: Ability to remain free from injury will improve Outcome: Progressing No falls during this admission. Call bell within reach. Bed in low and locked position. Patient alert and oriented. Clean and clear environment maintained. Nonskid footwear being utilized. 3/4 siderails in place. Patient verbalized understanding of safety instruction.  Problem: Pain Managment: Goal: General experience of comfort will improve Outcome: Progressing Pain being managed with PO PRN pain medication.

## 2016-11-30 NOTE — Progress Notes (Signed)
  Patient discharged to home with instructions and prescriptions. His ride came by at 8am.

## 2016-11-30 NOTE — Discharge Summary (Signed)
Patient was discharged prior to my evaluation this morning.

## 2016-12-02 LAB — CULTURE, BODY FLUID W GRAM STAIN -BOTTLE: Culture: NO GROWTH

## 2016-12-02 LAB — CULTURE, BODY FLUID-BOTTLE

## 2016-12-02 NOTE — Discharge Summary (Signed)
Physician Discharge Summary  Gearlean AlfWilliam S Yarrow ZOX:[id number] DOB: 11/20/1950 DOA: 11/21/2016  PCP: Patient, No Pcp Per  Admit date: 11/21/2016 Discharge date: 12/02/2016  Recommendations for Outpatient Follow-up:  1. Pt will need to follow up with PCP in 2-3 weeks post discharge 2. Please obtain BMP to evaluate electrolytes and kidney function, Mg level 3. Please also check CBC to evaluate Hg and Hct levels 4. PLEASE NOTE PT LEFT PRIOR TO BEING SEEN BY ROUNDING PROVIDER   Discharge Diagnoses:  Principal Problem:   Right inguinal hernia Active Problems:   Alcohol abuse   Cirrhosis, alcoholic (HCC)   Hepatitis C infection   Abnormal LFTs   Lactic acidosis   Cholelithiasis   Microcytic anemia   Melena   Protein-calorie malnutrition, severe  Discharge Condition: Stable  Diet recommendation: Heart healthy diet discussed in details   History of present illness:  Pt is [age] male with known hx of alcohol abuse, hep C, alcoholic cirrhosis, tobacco abuse, presented with right groin pain. On admission, imaging studies worrisome for herniation of a long segment of distal ileum into a large right inguinal hernia, no evidence of bowel obstruction. Surgery consulted.   Assessment & Plan:   Principal Problem:   Right inguinal hernia - no evidence of obstruction - per surgery team, no indication for urgent surgical intervention at this time but if pt would need this, tertiary medical center recommended  - second paracentesis done, 2 L fluid removed     Abd ascites - IR consulted for paracentesis, 1.8 L fluid removed 6/25 - second paracentesis done, 2 L fluid removed     Urinary retention - urology assistance appreciated - outpatient follow up recommended for consideration of cystoscopy  - pt did not want to take tamsulosis, saying it does not make him feel good     ? Ascending colitis  - noted on CT abd  - there was also ? Of acute cholecystitis on US but clinically does not correlate   - Completed therapy with Cipro 7 days therapy   Active Problems:   Alcohol abuse, acute metabolic encephalopathy  - no signs of withdrawal while inpatient     Non sustained v-tach on 6/25  - 4 runs, pt asymptomatic - has not had chest pain     Cirrhosis, alcoholic (HCC), Hep C, transaminitis, coagulopathy  - high risk for surgical interventions - ongoing alcohol use  - pt counseled on cessation     Anemia of chronic disease, thrombocytopenia, now presented with melena  - in the setting of alcoholic cirrhosis, alcohol induced bone marrow damage  - transfused 1 U PRBC 6/24, Hg up post transfusion from 6.8 --> 8.1 --> 8.1 --> 7.9 --> 8.4 - Plt's overall stable     Hypokalemia and hypomagnesemia - supplemented     Hyponatremia, ascites  - has been ~ 128    Hypotension - held metoprolol until BP stabilizes     Severe PCM - provide nutritional supplements   DVT prophylaxis: SCD Code Status: Full  Family Communication:  Disposition Plan: home   Consultants:   Surgery   IR  Urology   Procedures:   None  Antimicrobials:   Cipro 6/19 --> 6/25  Flagyl 6/19 --> 6/25  Procedures/Studies: Ct Abdomen Pelvis W Contrast  Result Date: 11/21/2016 CLINICAL DATA:  Acute onset of right inguinal pain after lifting battery. Initial encounter. EXAM: CT ABDOMEN AND PELVIS WITH CONTRAST TECHNIQUE: Multidetector CT imaging of the abdomen and pelvis was performed using the standard  protocol following bolus administration of intravenous contrast. CONTRAST:  ISOVUE-300 IOPAMIDOL (ISOVUE-300) INJECTION 61% COMPARISON:  Right upper quadrant ultrasound performed 12/23/2014, and CT of the abdomen and pelvis performed 12/12/2014 FINDINGS: Lower chest: Minimal bibasilar atelectasis is noted. The visualized portions of the mediastinum are unremarkable. Hepatobiliary: There is diffuse nodularity of the liver, with associated heterogeneity, reflecting hepatic cirrhosis and  venous congestion. There is recanalization of the umbilical vein. Scattered gastric and splenic varices are noted. The gallbladder is mildly distended, and contains several small stones. The common bile duct remains normal in caliber. Pancreas: The pancreas is within normal limits. Spleen: The spleen is unremarkable in appearance. Adrenals/Urinary Tract: The adrenal glands are unremarkable in appearance. The kidneys are within normal limits. There is no evidence of hydronephrosis. No renal or ureteral stones are identified. Mild left-sided nonspecific perinephric stranding is noted. Stomach/Bowel: The stomach is unremarkable in appearance. There is herniation of a long segment of distal ileum into a large right inguinal hernia, with mild associated soft tissue edema and a small amount of free fluid. There is no evidence of bowel obstruction. The appendix is normal in caliber, without evidence of appendicitis. Wall thickening along the ascending colon raises concern for an acute infectious or inflammatory colitis. Vascular/Lymphatic: Scattered calcification is seen along the distal abdominal aorta and its branches. The abdominal aorta is otherwise grossly unremarkable. The inferior vena cava is grossly unremarkable. No retroperitoneal lymphadenopathy is seen. No pelvic sidewall lymphadenopathy is identified. Reproductive: The bladder is mildly distended and grossly unremarkable. The prostate remains normal in size. Other: Small volume ascites is seen within the abdomen and pelvis. Musculoskeletal: No acute osseous abnormalities are identified. The visualized musculature is unremarkable in appearance. IMPRESSION: 1. Wall thickening along the ascending colon raises concern for acute infectious or inflammatory colitis, though it could be chronic in nature. 2. Herniation of a long segment of distal ileum into a large right inguinal hernia, with mild associated soft tissue inflammation and a small amount of free fluid. No  evidence of bowel obstruction. 3. Findings of hepatic cirrhosis, with re- cannulization of the umbilical vein, and scattered gastric and splenic varices. 4. Small volume ascites within the abdomen and pelvis. 5. Scattered aortic atherosclerosis. 6. Cholelithiasis, with mild gallbladder distention. Gallbladder otherwise unremarkable. Electronically Signed   By: Roanna Raider M.D.   On: 11/21/2016 18:11   Dg Abd 2 Views  Result Date: 11/28/2016 CLINICAL DATA:  Abdominal distention. EXAM: ABDOMEN - 2 VIEW COMPARISON:  Ultrasound 11/21/2016.  CT 11/21/2016 . FINDINGS: Soft tissue structures are unremarkable. No bowel distention. No free air. Faint calcification right upper quadrant may be related to gallstones noted on prior CT . Bibasilar subsegmental atelectasis. Degenerative changes both hips. IMPRESSION: 1. No acute abnormality. No bowel distention or free air. Faint calcification right upper quadrant may relate to gallstones noted on prior CT. No acute abnormality identified. 2. Bibasilar atelectasis Electronically Signed   By: Maisie Fus  Register   On: 11/28/2016 14:20   US Abdomen Limited Ruq  Result Date: 11/21/2016 CLINICAL DATA:  66 y/o  M; cholelithiasis. EXAM: ULTRASOUND ABDOMEN LIMITED RIGHT UPPER QUADRANT COMPARISON:  11/21/2016 CT of the abdomen and pelvis. FINDINGS: Gallbladder: Negative sonographic Murphy sign. Gallstones measuring up to 3 mm. Mild gallbladder wall thickening and pericholecystic fluid. Common bile duct: Diameter: 3 mm Liver: Cirrhotic configuration of the liver. Small volume of perihepatic ascites. IMPRESSION: 1. Cholelithiasis. 2. Mild gallbladder wall thickening and pericholecystic fluid is indeterminate for acute cholecystitis in the setting of  ascites. Negative sonographic Murphy's sign. 3. Liver cirrhosis. Electronically Signed   By: Mitzi Hansen M.D.   On: 11/21/2016 22:57   Ir Paracentesis  Result Date: 11/29/2016 INDICATION: Patient with history of ascites.  Request is made for therapeutic paracentesis. EXAM: ULTRASOUND GUIDED THERAPEUTIC PARACENTESIS MEDICATIONS: 10 mL 1% lidocaine. COMPLICATIONS: None immediate. PROCEDURE: Informed written consent was obtained from the patient after a discussion of the risks, benefits and alternatives to treatment. A timeout was performed prior to the initiation of the procedure. Initial ultrasound scanning demonstrates a large amount of ascites within the left lateral abdomen. The left lateral abdomen was prepped and draped in the usual sterile fashion. 1% lidocaine was used for local anesthesia. Following this, a 19 gauge, 7-cm, Yueh catheter was introduced. An ultrasound image was saved for documentation purposes. The paracentesis was performed. The catheter was removed and a dressing was applied. The patient tolerated the procedure well without immediate post procedural complication. FINDINGS: A total of approximately 2.0 liters of clear, yellow fluid was removed. IMPRESSION: Successful ultrasound-guided therapeutic paracentesis yielding 2.0 liters of peritoneal fluid. Read by:  Loyce Dys PA-C Electronically Signed   By: Jolaine Click M.D.   On: 11/29/2016 12:42   Ir Paracentesis  Result Date: 11/27/2016 INDICATION: Patient with history of alcoholic cirrhosis, now with ascites. Request is made for diagnostic and therapeutic paracentesis. EXAM: ULTRASOUND GUIDED DIAGNOSTIC AND THERAPEUTIC PARACENTESIS MEDICATIONS: 10 mL 1% lidocaine COMPLICATIONS: None immediate. PROCEDURE: Informed written consent was obtained from the patient after a discussion of the risks, benefits and alternatives to treatment. A timeout was performed prior to the initiation of the procedure. Initial ultrasound scanning demonstrates a large amount of ascites within the left lateral abdomen. The left lateral abdomen was prepped and draped in the usual sterile fashion. 1% lidocaine was used for local anesthesia. Following this, a 19 gauge, 7-cm, Yueh  catheter was introduced. An ultrasound image was saved for documentation purposes. The paracentesis was performed. The catheter was removed and a dressing was applied. The patient tolerated the procedure well without immediate post procedural complication. FINDINGS: A total of approximately 1.8 liters of yellow, clear fluid was removed. Samples were sent to the laboratory as requested by the clinical team. IMPRESSION: Successful ultrasound-guided diagnostic and therapeutic paracentesis yielding 1.8 liters of peritoneal fluid. Read by:  Loyce Dys PA-C Electronically Signed   By: Simonne Come M.D.   On: 11/27/2016 12:37     Discharge Exam: Vitals:   11/29/16 2302 11/30/16 0530  BP: 105/62 114/73  Pulse: 87 100  Resp: 17 18  Temp: 98.3 F (36.8 C) 98.7 F (37.1 C)   Vitals:   11/29/16 2302 11/30/16 0500 11/30/16 0530 11/30/16 0910  BP: 105/62  114/73   Pulse: 87  100   Resp: 17  18   Temp: 98.3 F (36.8 C)  98.7 F (37.1 C)   TempSrc: Oral  Oral   SpO2: 100%  97% 90%  Weight:  70.6 kg (155 lb 10.3 oz)    Height:        Pt left prior to being seen by Dr. Nelson Chimes.   Discharge Instructions  Discharge Instructions    Diet - low sodium heart healthy    Complete by:  As directed    Increase activity slowly    Complete by:  As directed      Allergies as of 11/30/2016      Reactions   Nsaids Other (See Comments)   Perforated ulcer  Medication List    STOP taking these medications   cloNIDine 0.2 mg/24hr patch Commonly known as:  CATAPRES - Dosed in mg/24 hr   methocarbamol 500 MG tablet Commonly known as:  ROBAXIN   metoprolol tartrate 25 MG tablet Commonly known as:  LOPRESSOR     TAKE these medications   folic acid 1 MG tablet Commonly known as:  FOLVITE Take 1 tablet (1 mg total) by mouth daily.   HYDROcodone-acetaminophen 5-325 MG tablet Commonly known as:  NORCO/VICODIN Take 1-2 tablets by mouth every 4 (four) hours as needed for moderate pain.    ondansetron 4 MG tablet Commonly known as:  ZOFRAN Take 1 tablet (4 mg total) by mouth every 6 (six) hours as needed for nausea.   pantoprazole 40 MG tablet Commonly known as:  PROTONIX Take 1 tablet (40 mg total) by mouth 2 (two) times daily.   thiamine 100 MG tablet Take 1 tablet (100 mg total) by mouth daily.      Follow-up Information    [facility]. Go on 12/14/2016.   Why:  at 2pm Contact information: [address] [place] [address] [phone]           The results of significant diagnostics from this hospitalization (including imaging, microbiology, ancillary and laboratory) are listed below for reference.     Microbiology: Recent Results (from the past 240 hour(s))  Culture, body fluid-bottle     Status: None   Collection Time: 11/27/16 12:44 PM  Result Value Ref Range Status   Specimen Description FLUID PERITONEAL  Final   Special Requests NONE  Final   Culture NO GROWTH 5 DAYS  Final   Report Status 12/02/2016 FINAL  Final  Gram stain     Status: None   Collection Time: 11/27/16 12:44 PM  Result Value Ref Range Status   Specimen Description FLUID PERITONEAL  Final   Special Requests NONE  Final   Gram Stain NO WBC SEEN NO ORGANISMS SEEN   Final   Report Status 11/27/2016 FINAL  Final     Labs: Basic Metabolic Panel:  Recent Labs Lab 11/26/16 0502 11/27/16 0724 11/28/16 0738 11/29/16 0339 11/30/16 0435  NA 128* 129* 128* 128* 128*  K 4.0 4.0 4.0 3.7 4.0  CL 103 102 101 100* 99*  CO2 22 20* 21* 24 21*  GLUCOSE 98 85 91 91 87  BUN 7 8 6 10 10   CREATININE 0.86 0.94 1.04 0.92 0.85  CALCIUM 7.8* 7.7* 7.9* 7.6* 7.9*  MG 1.8 1.5* 1.8 1.6*  --    Liver Function Tests:  Recent Labs Lab 11/27/16 0724 11/27/16 1500 11/28/16 0738 11/29/16 0339 11/30/16 0435  AST 44*  --  46* 44* 43*  ALT 16*  --  15* 16* 15*  ALKPHOS 70  --  69 73 68  BILITOT 5.7* 6.1* 5.0* 3.3* 3.8*  PROT  6.0*  --  6.0* 5.6* 5.8*  ALBUMIN 2.1*  --  2.2* 2.0* 2.1*   No results for input(s): LIPASE, AMYLASE in the last 168 hours. No results for input(s): AMMONIA in the last 168 hours. CBC:  Recent Labs Lab 11/26/16 0502 11/27/16 0724 11/28/16 0738 11/29/16 0339 11/30/16 0435  WBC 7.8 9.2 9.6 9.0 8.5  HGB 6.8* 8.1* 8.1* 7.9* 8.4*  HCT 20.5* 23.6* 23.8* 23.0* 24.5*  MCV 75.6* 75.4* 76.0* 75.9* 74.7*  PLT 121* 112* 127* 138* 133*    SIGNED: Time coordinating discharge: pt left home prior  to being seen by rounding provider Dr. Nelson Chimes.   Debbora Presto, MD  [facility] 12/02/2016, 11:24 PM Pager [phone]  If 7PM-7AM, please contact night-coverage [email or web address] Password [email or web address]

## 2016-12-14 ENCOUNTER — Inpatient Hospital Stay (INDEPENDENT_AMBULATORY_CARE_PROVIDER_SITE_OTHER): Payer: Medicaid Other | Admitting: Physician Assistant

## 2017-04-05 DEATH — deceased

## 2017-10-15 IMAGING — US IR PARACENTESIS
1 series · 4 of 4 positions shown · non-contrast
Comparison: none

INDICATION: Patient with history of alcoholic cirrhosis, now with ascites.
Request is made for diagnostic and therapeutic paracentesis.

[Series 1: ir (id) (id)/(id)/(id) ir · 4 of 4 slices shown]
[im 1/4]
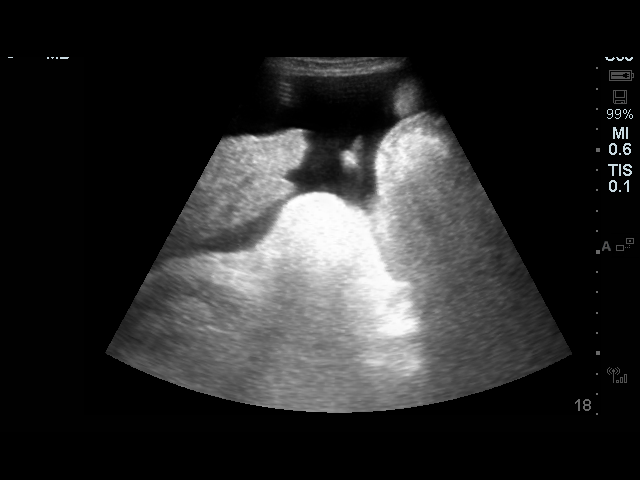
[im 2/4]
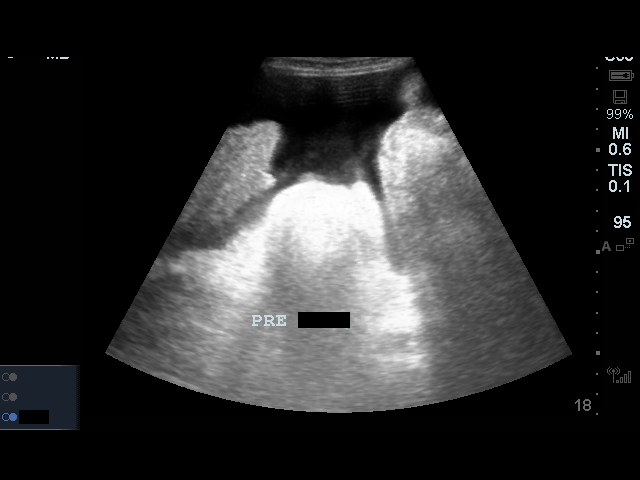
[im 3/4]
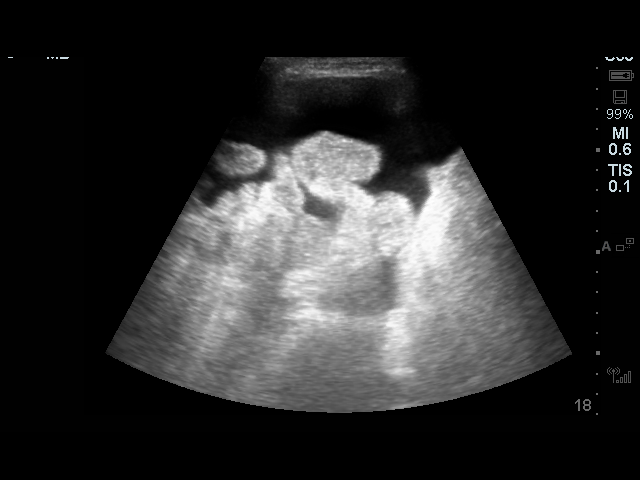
[im 4/4]
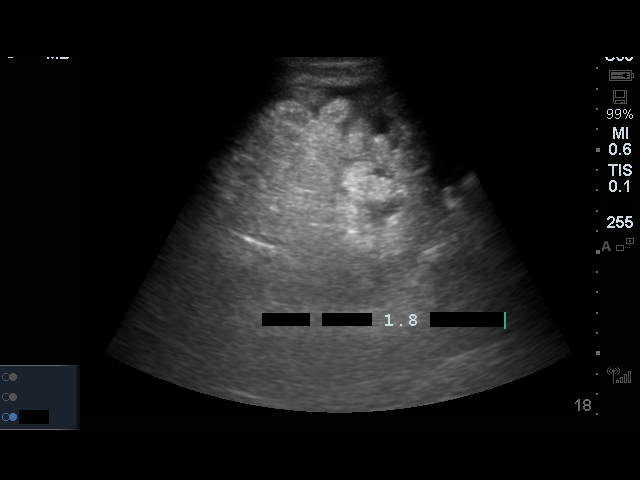

[4 of 4 positions shown; findings below may reference images not displayed]

EXAM:
ULTRASOUND GUIDED DIAGNOSTIC AND THERAPEUTIC PARACENTESIS

MEDICATIONS:
10 mL 1% lidocaine

COMPLICATIONS:
None immediate.

PROCEDURE:
Informed written consent was obtained from the patient after a
discussion of the risks, benefits and alternatives to treatment. A
timeout was performed prior to the initiation of the procedure.

Initial ultrasound scanning demonstrates a large amount of ascites
within the left lateral abdomen. The left lateral abdomen was
prepped and draped in the usual sterile fashion. 1% lidocaine was
used for local anesthesia.

Following this, a 19 gauge, 7-cm, Yueh catheter was introduced. An
ultrasound image was saved for documentation purposes. The
paracentesis was performed. The catheter was removed and a dressing
was applied. The patient tolerated the procedure well without
immediate post procedural complication.
FINDINGS: A total of approximately 1.8 liters of yellow, clear fluid was
removed. Samples were sent to the laboratory as requested by the
clinical team.
IMPRESSION: Successful ultrasound-guided diagnostic and therapeutic paracentesis
yielding 1.8 liters of peritoneal fluid.
